# Patient Record
Sex: Male | Born: 1960 | Race: White | Hispanic: No | Marital: Married | Smoking: Never smoker
Health system: Southern US, Community
[De-identification: ages and names within clinical notes are randomized; demographics above are authoritative.]

## PROBLEM LIST (undated history)

## (undated) DIAGNOSIS — Z8719 Personal history of other diseases of the digestive system: Secondary | ICD-10-CM

## (undated) DIAGNOSIS — M1990 Unspecified osteoarthritis, unspecified site: Secondary | ICD-10-CM

## (undated) DIAGNOSIS — G4733 Obstructive sleep apnea (adult) (pediatric): Secondary | ICD-10-CM

## (undated) DIAGNOSIS — J45990 Exercise induced bronchospasm: Secondary | ICD-10-CM

## (undated) DIAGNOSIS — D759 Disease of blood and blood-forming organs, unspecified: Secondary | ICD-10-CM

## (undated) DIAGNOSIS — Z9109 Other allergy status, other than to drugs and biological substances: Secondary | ICD-10-CM

## (undated) DIAGNOSIS — J449 Chronic obstructive pulmonary disease, unspecified: Secondary | ICD-10-CM

## (undated) DIAGNOSIS — D509 Iron deficiency anemia, unspecified: Secondary | ICD-10-CM

## (undated) DIAGNOSIS — K219 Gastro-esophageal reflux disease without esophagitis: Secondary | ICD-10-CM

## (undated) HISTORY — PX: KNEE ARTHROSCOPY: SUR90

## (undated) HISTORY — PX: ESOPHAGOGASTRODUODENOSCOPY: SHX1529

## (undated) HISTORY — PX: COLONOSCOPY: SHX174

## (undated) SURGERY — IMAGING PROCEDURE, GI TRACT, INTRALUMINAL, VIA CAPSULE

---

## 2005-11-19 ENCOUNTER — Other Ambulatory Visit: Admission: RE | Admit: 2005-11-19 | Discharge: 2005-11-19 | Payer: Self-pay | Admitting: Urology

## 2011-04-29 ENCOUNTER — Emergency Department (HOSPITAL_COMMUNITY)
Admission: EM | Admit: 2011-04-29 | Discharge: 2011-04-29 | Disposition: A | Discharge status: Home / Self Care | Payer: Managed Care, Other (non HMO) | Attending: Emergency Medicine | Admitting: Emergency Medicine

## 2011-04-29 ENCOUNTER — Emergency Department (HOSPITAL_COMMUNITY): Payer: Managed Care, Other (non HMO)

## 2011-04-29 DIAGNOSIS — G510 Bell's palsy: Secondary | ICD-10-CM | POA: Insufficient documentation

## 2011-04-29 LAB — DIFFERENTIAL
Eosinophils Relative: 4 % (ref 0–5)
Lymphocytes Relative: 45 % (ref 12–46)
Lymphs Abs: 2.3 10*3/uL (ref 0.7–4.0)

## 2011-04-29 LAB — BASIC METABOLIC PANEL
BUN: 14 mg/dL (ref 6–23)
CO2: 23 mEq/L (ref 19–32)
Chloride: 104 mEq/L (ref 96–112)
Creatinine, Ser: 0.91 mg/dL (ref 0.50–1.35)
Glucose, Bld: 101 mg/dL — ABNORMAL HIGH (ref 70–99)

## 2011-04-29 LAB — CBC
HCT: 35.3 % — ABNORMAL LOW (ref 39.0–52.0)
MCV: 85.5 fL (ref 78.0–100.0)
RBC: 4.13 MIL/uL — ABNORMAL LOW (ref 4.22–5.81)
RDW: 12.6 % (ref 11.5–15.5)
WBC: 5 10*3/uL (ref 4.0–10.5)

## 2012-06-21 ENCOUNTER — Telehealth (INDEPENDENT_AMBULATORY_CARE_PROVIDER_SITE_OTHER): Payer: Self-pay | Admitting: *Deleted

## 2012-06-21 ENCOUNTER — Ambulatory Visit (INDEPENDENT_AMBULATORY_CARE_PROVIDER_SITE_OTHER): Payer: Managed Care, Other (non HMO) | Admitting: Internal Medicine

## 2012-06-21 ENCOUNTER — Encounter (INDEPENDENT_AMBULATORY_CARE_PROVIDER_SITE_OTHER): Payer: Self-pay | Admitting: Internal Medicine

## 2012-06-21 ENCOUNTER — Encounter (INDEPENDENT_AMBULATORY_CARE_PROVIDER_SITE_OTHER): Payer: Self-pay | Admitting: *Deleted

## 2012-06-21 ENCOUNTER — Other Ambulatory Visit (INDEPENDENT_AMBULATORY_CARE_PROVIDER_SITE_OTHER): Payer: Self-pay | Admitting: *Deleted

## 2012-06-21 VITALS — BP 102/80 | HR 72 | Temp 98.0°F | Ht 70.0 in | Wt 232.0 lb

## 2012-06-21 DIAGNOSIS — D509 Iron deficiency anemia, unspecified: Secondary | ICD-10-CM

## 2012-06-21 DIAGNOSIS — K625 Hemorrhage of anus and rectum: Secondary | ICD-10-CM

## 2012-06-21 DIAGNOSIS — Z1211 Encounter for screening for malignant neoplasm of colon: Secondary | ICD-10-CM

## 2012-06-21 DIAGNOSIS — R195 Other fecal abnormalities: Secondary | ICD-10-CM | POA: Insufficient documentation

## 2012-06-21 MED ORDER — PEG-KCL-NACL-NASULF-NA ASC-C 100 G PO SOLR: 1.0000 | Freq: Once | ORAL | Status: DC

## 2012-06-21 NOTE — Patient Instructions (Addendum)
Colonoscopy, if normal EGD.  The risks and benefits such as perforation, bleeding, and infection were reviewed with the patient and is agreeable.  

## 2012-06-21 NOTE — Telephone Encounter (Signed)
agree

## 2012-06-21 NOTE — Telephone Encounter (Signed)
Patient needs movi prep 

## 2012-06-21 NOTE — Progress Notes (Signed)
Subjective:     Patient ID: Jacob Hanna, male   DOB: 20-Jan-1961, 51 y.o.   MRN: [id number]  HPI Referred by Dr. Gerda Diss for anemia. H and H 12.3 and 35.7. MCV 81.85m platelet ct 284. He tells me he sees bright red blood per rectum after a hard BM.  Occuring for about a year. There has been no unintentional weight loss. Appetite is good. No acid reflux. No dysphagia. No abdominal pain. He usually has a BM about 1 a day or every 3-4 days. No change in stools. Normal caliber.  He has had a tired feeling for about a year, progressively worsened.  06/15/2012 Iron 41, UIBC 464, TIBC 505, % Saturation: 8,  Ferritin 2.  Vitamin B12 926.     Review of Systems see hpi Current Outpatient Prescriptions  Medication Sig Dispense Refill  . fish oil-omega-3 fatty acids 1000 MG capsule Take 2 g by mouth daily.      . Multiple Vitamin (MULTIVITAMIN) tablet Take 1 tablet by mouth daily.      . vitamin B-12 (CYANOCOBALAMIN) 250 MCG tablet Take 1,000 mcg by mouth daily.       History reviewed. No pertinent past medical history. History reviewed. No pertinent past surgical history. No Known Allergies History   Social History  . Marital Status: Married    Spouse Name: N/A    Number of Children: N/A  . Years of Education: N/A   Occupational History  . Not on file.   Social History Main Topics  . Smoking status: Never Smoker   . Smokeless tobacco: Not on file  . Alcohol Use: No  . Drug Use: No  . Sexually Active: Not on file   Other Topics Concern  . Not on file   Social History Narrative  . No narrative on file   Family Status  Relation Status Death Age  . Mother Alive     good health  . Father Deceased     CHF,  bleeding internally  . Sister Alive     heart murmur  . Brother Alive     good health       Objective:   Physical Exam Filed Vitals:   06/21/12 1021  Height: 5\' 10"  (1.778 m)  Weight: 232 lb (105.235 kg)   Alert and oriented. Skin warm and dry. Oral mucosa is moist.    . Sclera anicteric, conjunctivae is pink. Thyroid not enlarged. No cervical lymphadenopathy. Lungs clear. Heart regular rate and rhythm.  Abdomen is soft. Bowel sounds are positive. No hepatomegaly. No abdominal masses felt. No tenderness.  No edema to lower extremities.  Stool brown and grossly guaiac positive.    Assessment:   Iron deficiency anemia.  Colonic neoplasm needs to be ruled.  If colonoscopy normal, PUD needs to be ruled out.    Plan:    Colonoscopy if normal EGD. The risks and benefits such as perforation, bleeding, and infection were reviewed with the patient and is agreeable.     Copy to [employer].

## 2012-06-22 ENCOUNTER — Encounter (HOSPITAL_COMMUNITY): Payer: Self-pay | Admitting: Pharmacy Technician

## 2012-06-30 LAB — HM COLONOSCOPY

## 2012-06-30 MED ORDER — SODIUM CHLORIDE 0.45 % IV SOLN
INTRAVENOUS | Status: DC
  Administered 2012-07-01: 1000 mL via INTRAVENOUS

## 2012-07-01 ENCOUNTER — Ambulatory Visit (HOSPITAL_COMMUNITY)
Admission: RE | Admit: 2012-07-01 | Discharge: 2012-07-01 | Disposition: A | Discharge status: Home / Self Care | Payer: Managed Care, Other (non HMO) | Source: Ambulatory Visit | Attending: Internal Medicine | Admitting: Internal Medicine

## 2012-07-01 ENCOUNTER — Encounter (HOSPITAL_COMMUNITY): Payer: Self-pay | Admitting: *Deleted

## 2012-07-01 ENCOUNTER — Encounter (HOSPITAL_COMMUNITY)
Admission: RE | Disposition: A | Discharge status: Home / Self Care | Payer: Self-pay | Source: Ambulatory Visit | Attending: Internal Medicine

## 2012-07-01 DIAGNOSIS — K2100 Gastro-esophageal reflux disease with esophagitis, without bleeding: Secondary | ICD-10-CM | POA: Insufficient documentation

## 2012-07-01 DIAGNOSIS — K625 Hemorrhage of anus and rectum: Secondary | ICD-10-CM

## 2012-07-01 DIAGNOSIS — K449 Diaphragmatic hernia without obstruction or gangrene: Secondary | ICD-10-CM | POA: Insufficient documentation

## 2012-07-01 DIAGNOSIS — K644 Residual hemorrhoidal skin tags: Secondary | ICD-10-CM | POA: Insufficient documentation

## 2012-07-01 DIAGNOSIS — K921 Melena: Secondary | ICD-10-CM | POA: Insufficient documentation

## 2012-07-01 DIAGNOSIS — D509 Iron deficiency anemia, unspecified: Secondary | ICD-10-CM | POA: Insufficient documentation

## 2012-07-01 SURGERY — COLONOSCOPY WITH ESOPHAGOGASTRODUODENOSCOPY (EGD)
Anesthesia: Moderate Sedation

## 2012-07-01 MED ORDER — STERILE WATER FOR IRRIGATION IR SOLN
Status: DC | PRN
  Administered 2012-07-01: 14:00:00

## 2012-07-01 MED ORDER — MIDAZOLAM HCL 5 MG/5ML IJ SOLN
INTRAMUSCULAR | Status: DC | PRN
  Administered 2012-07-01: 2 mg via INTRAVENOUS
  Administered 2012-07-01: 3 mg via INTRAVENOUS
  Administered 2012-07-01 (×5): 2 mg via INTRAVENOUS

## 2012-07-01 MED ORDER — PANTOPRAZOLE SODIUM 40 MG PO TBEC: 40.0000 mg | DELAYED_RELEASE_TABLET | Freq: Every day | ORAL | Status: DC

## 2012-07-01 MED ORDER — BUTAMBEN-TETRACAINE-BENZOCAINE 2-2-14 % EX AERO
INHALATION_SPRAY | CUTANEOUS | Status: DC | PRN
  Administered 2012-07-01: 2 via TOPICAL

## 2012-07-01 MED ORDER — MEPERIDINE HCL 50 MG/ML IJ SOLN
INTRAMUSCULAR | Status: DC | PRN
  Administered 2012-07-01 (×2): 25 mg via INTRAVENOUS

## 2012-07-01 MED ORDER — MIDAZOLAM HCL 5 MG/5ML IJ SOLN
INTRAMUSCULAR | Status: AC
  Filled 2012-07-01: qty 15

## 2012-07-01 MED ORDER — FERROUS SULFATE 325 (65 FE) MG PO TABS: 325.0000 mg | ORAL_TABLET | Freq: Two times a day (BID) | ORAL | Status: DC

## 2012-07-01 MED ORDER — MEPERIDINE HCL 50 MG/ML IJ SOLN
INTRAMUSCULAR | Status: AC
  Filled 2012-07-01: qty 1

## 2012-07-01 NOTE — Op Note (Signed)
EGD PROCEDURE REPORT  PATIENT:  Jacob Hanna  MR#:  [id number] Birthdate:  [birth year], 51 y.o., male Endoscopist:  Dr. Malissa Hippo, MD Referred By:  Dr. Lilyan Punt, MD Procedure Date: 07/01/2012  Procedure: Colonoscopy followed by esophagogastroduodenoscopy.  Indications:  Patient is 51 year old Caucasian male who was recently found to have iron deficiency anemia. He does give history of intermittent constipation and hematochezia. When he was seen in the office recently as stool was noted to be heme positive. He does take Naprosyn and/or Advil 2-3 times a week. He denies frank rectal bleeding melena abdominal pain frequent heartburn nausea vomiting or dysphagia. Family history is negative for colorectal carcinoma. He will undergo colonoscopy to be followed by EGD if colonoscopy is negative.            Informed Consent:  The risks, benefits, alternatives & imponderables which include, but are not limited to, bleeding, infection, perforation, drug reaction and potential missed lesion have been reviewed.  The potential for biopsy, lesion removal, esophageal dilation, etc. have also been discussed.  Questions have been answered.  All parties agreeable.  Please see history & physical in medical record for more information.  Medications:  Demerol 50 mg IV Versed 15 mg IV Cetacaine spray topically for oropharyngeal anesthesia    COLONOSCOPY Description of procedure:  After a digital rectal exam was performed, that colonoscope was advanced from the anus through the rectum and colon to the area of the cecum, ileocecal valve and appendiceal orifice. The cecum was deeply intubated. These structures were well-seen and photographed for the record. From the level of the cecum and ileocecal valve, the scope was slowly and cautiously withdrawn. The mucosal surfaces were carefully surveyed utilizing scope tip to flexion to facilitate fold flattening as needed. The scope was pulled down into the rectum  where a thorough exam including retroflexion was performed.  Findings:   Prep satisfactory. Normal mucosa of the colon throughout. Normal rectal mucosa. Small hemorrhoids below the dentate line.  Therapeutic/Diagnostic Maneuvers Performed:  None  Complications:  None  Cecal Withdrawal Time:  9 minutes EGD  Description of procedure:  The endoscope was introduced through the mouth and advanced to the second portion of the duodenum without difficulty or limitations. The mucosal surfaces were surveyed very carefully during advancement of the scope and upon withdrawal.  Findings:  Esophagus:  Mucosa of the esophagus was normal. Focal erythema and edema noted at GE junction. Linear erosions noted at the level of hiatus as well as involving mucosa of the herniated part of the stomach. GEJ:  39 cm Hiatus:  44 cm Stomach:  Stomach was empty and distended very well with insufflation. Folds in the proximal stomach were normal. Examination mucosa at body was normal. There was 5 mm submucosal lesion at antrum possibly a lipoma; it was left alone. Angularis was unremarkable. Hernia was easily seen on retroflexed view. Duodenum:  Patchy bulbar erythema and edema but no erosions or ulcers noted.  Therapeutic/Diagnostic Maneuvers Performed:  None  Impression:  Normal colonoscopy except small external hemorrhoids. Mild changes of reflux esophagitis limited to GE junction. Moderate size hiatal hernia with erosions involving herniated part of the stomach and at the level of hiatus. Incidental finding of 5 mm submucosal nodule at antrum possibly a small lipoma. Bulbar duodenitis. It is possible that he has been losing blood from his small bowel secondary to NSAID injury.   Recommendations:  Patient advised to discontinue Naprosyn and use low-dose Advil when absolutely necessary on  as-needed basis. Anti-reflux measures. Pantoprazole 40 mg by mouth every morning. Ferrous Sulfate 325 mg by mouth twice  a day. Office visit in 8 weeks. Will determine at the time of his next office visit whether or not he will need  small bowel given capsule study  REHMAN,NAJEEB U  07/01/2012 2:14 PM  CC: Dr. Lilyan Punt, MD & Dr. Bonnetta Barry ref. provider found

## 2012-07-01 NOTE — H&P (Signed)
Jacob Hanna is an 51 y.o. male.   Chief Complaint: Patient is here for colonoscopy followed by esophagogastroduodenoscopy if needed. HPI: Patient is 50 year old Caucasian male who has iron deficiency anemia. He does give history of intermittent constipation and hematochezia. He has good appetite and denies abdominal pain frequent heartburn nausea or vomiting. He has been using Naprosyn or ibuprofen no more than twice a week but not every week.  History reviewed. No pertinent past medical history.  History reviewed. No pertinent past surgical history.  History reviewed. No pertinent family history. Social History:  reports that he has never smoked. He does not have any smokeless tobacco history on file. He reports that he does not drink alcohol or use illicit drugs.  Allergies: No Known Allergies  Medications Prior to Admission  Medication Sig Dispense Refill  . fish oil-omega-3 fatty acids 1000 MG capsule Take 2 g by mouth daily.      . mometasone (ELOCON) 0.1 % cream Apply 1 application topically Once daily as needed. Rash      . Multiple Vitamin (MULTIVITAMIN) tablet Take 1 tablet by mouth daily.      . naproxen (NAPROSYN) 500 MG tablet Take 500 mg by mouth 2 (two) times daily as needed. Pain      . peg 3350 powder (MOVIPREP) 100 G SOLR Take 1 kit (100 g total) by mouth once.  1 kit  0  . vitamin B-12 (CYANOCOBALAMIN) 1000 MCG tablet Take 1,000 mcg by mouth daily.        No results found for this or any previous visit (from the past 48 hour(s)). No results found.  ROS  Blood pressure 131/87, pulse 65, temperature 97.6 F (36.4 C), temperature source Oral, resp. rate 16, SpO2 97.00%. Physical Exam  Constitutional: He appears well-developed and well-nourished.  HENT:  Mouth/Throat: Oropharynx is clear and moist. No oropharyngeal exudate.  Eyes: Conjunctivae are normal. No scleral icterus.  Neck: No thyromegaly present.  Cardiovascular: Normal rate, regular rhythm and normal  heart sounds.   No murmur heard. Respiratory: Effort normal.  GI: Soft. He exhibits no distension and no mass. There is no tenderness.  Musculoskeletal: He exhibits no edema.  Lymphadenopathy:    He has no cervical adenopathy.  Neurological: He is alert.  Skin: Skin is warm and dry.     Assessment/Plan Hematochezia. Intermittent NSAID use Iron deficiency anemia. Diagnostic colonoscopy. EGD if colonoscopy is negative.  REHMAN,NAJEEB U 07/01/2012, 1:26 PM

## 2012-07-15 ENCOUNTER — Encounter (INDEPENDENT_AMBULATORY_CARE_PROVIDER_SITE_OTHER): Payer: Self-pay

## 2012-08-29 ENCOUNTER — Encounter (INDEPENDENT_AMBULATORY_CARE_PROVIDER_SITE_OTHER): Payer: Self-pay | Admitting: Internal Medicine

## 2012-08-29 ENCOUNTER — Ambulatory Visit (INDEPENDENT_AMBULATORY_CARE_PROVIDER_SITE_OTHER): Payer: Managed Care, Other (non HMO) | Admitting: Internal Medicine

## 2012-08-29 ENCOUNTER — Encounter (INDEPENDENT_AMBULATORY_CARE_PROVIDER_SITE_OTHER): Payer: Self-pay | Admitting: *Deleted

## 2012-08-29 ENCOUNTER — Other Ambulatory Visit (INDEPENDENT_AMBULATORY_CARE_PROVIDER_SITE_OTHER): Payer: Self-pay | Admitting: *Deleted

## 2012-08-29 VITALS — BP 120/72 | HR 72 | Temp 98.2°F | Ht 71.0 in | Wt 232.0 lb

## 2012-08-29 DIAGNOSIS — D649 Anemia, unspecified: Secondary | ICD-10-CM

## 2012-08-29 DIAGNOSIS — K922 Gastrointestinal hemorrhage, unspecified: Secondary | ICD-10-CM

## 2012-08-29 DIAGNOSIS — D509 Iron deficiency anemia, unspecified: Secondary | ICD-10-CM

## 2012-08-29 LAB — IRON AND TIBC
Iron: 192 ug/dL — ABNORMAL HIGH (ref 42–165)
TIBC: 494 ug/dL — ABNORMAL HIGH (ref 215–435)
UIBC: 302 ug/dL (ref 125–400)

## 2012-08-29 LAB — CBC
Hemoglobin: 14.3 g/dL (ref 13.0–17.0)
MCH: 29.6 pg (ref 26.0–34.0)
Platelets: 287 10*3/uL (ref 150–400)
RBC: 4.83 MIL/uL (ref 4.22–5.81)
WBC: 5.1 10*3/uL (ref 4.0–10.5)

## 2012-08-29 LAB — FERRITIN: Ferritin: 16 ng/mL — ABNORMAL LOW (ref 22–322)

## 2012-08-29 NOTE — Progress Notes (Addendum)
Subjective:     Patient ID: Jacob Hanna, male   DOB: [birth year], 51 y.o.   MRN: [id number]  HPIHere today for follow. He says his energy level is better. Underwent a colonoscopy and EGD in August. Usually has a BM about every 2 days. Stools are soft.  He is presently taking iron. He has not seen any blood.  Stools are normal caliber. No weight loss. Appetite is good. No weight loss.  No dysphagia.  No NSAIDs per patient.   Seen in August for anemia. H and H 12.3 and 35.7, MCV 81.  He had been passing bright red blood from his rectum after a hard stool.  Symptoms of being tired. 8/72013 Iron 41, UIBC 464, TIBC 505, Ferritin 2, Saturation 8, Vitamin B12 926.      EGD: 07/01/2012 Impression:  Normal colonoscopy except small external hemorrhoids.  Mild changes of reflux esophagitis limited to GE junction.  Moderate size hiatal hernia with erosions involving herniated part of the stomach and at the level of hiatus.  Incidental finding of 5 mm submucosal nodule at antrum possibly a small lipoma.  Bulbar duodenitis.  It is possible that he has been losing blood from his small bowel secondary to NSAID injury.      Review of Systems Current Outpatient Prescriptions  Medication Sig Dispense Refill  . ferrous sulfate 325 (65 FE) MG tablet Take 1 tablet (325 mg total) by mouth 2 (two) times daily after a meal.  30 tablet  11  . fish oil-omega-3 fatty acids 1000 MG capsule Take 2 g by mouth daily.      . mometasone (ELOCON) 0.1 % cream Apply 1 application topically Once daily as needed. Rash      . Multiple Vitamin (MULTIVITAMIN) tablet Take 1 tablet by mouth daily.      . pantoprazole (PROTONIX) 40 MG tablet Take 40 mg by mouth as needed.      . vitamin B-12 (CYANOCOBALAMIN) 1000 MCG tablet Take 1,000 mcg by mouth daily.      Marland Kitchen DISCONTD: pantoprazole (PROTONIX) 40 MG tablet Take 1 tablet (40 mg total) by mouth daily.  30 tablet  5   History reviewed. No pertinent past medical  history. History reviewed. No pertinent past surgical history. History   Social History  . Marital Status: Married    Spouse Name: N/A    Number of Children: N/A  . Years of Education: N/A   Occupational History  . Not on file.   Social History Main Topics  . Smoking status: Never Smoker   . Smokeless tobacco: Not on file  . Alcohol Use: No  . Drug Use: No  . Sexually Active: Not on file   Other Topics Concern  . Not on file   Social History Narrative  . No narrative on file   Family Status  Relation Status Death Age  . Mother Alive     good health  . Father Deceased     CHF,  bleeding internally  . Sister Alive     heart murmur  . Brother Alive     good health   No Known Allergies     Objective:   Physical Exam Filed Vitals:   08/29/12 0939  BP: 120/72  Pulse: 72  Temp: 98.2 F (36.8 C)  Height: 5\' 11"  (1.803 m)  Weight: 232 lb (105.235 kg)    Alert and oriented. Skin warm and dry. Oral mucosa is moist.   . Sclera anicteric, conjunctivae is  pink. Thyroid not enlarged. No cervical lymphadenopathy. Lungs clear. Heart regular rate and rhythm.  Abdomen is soft. Bowel sounds are positive. No hepatomegaly. No abdominal masses felt. No tenderness.  No edema to lower extremities. Stool guaiac positive     Assessment:    GI bleed with normal Colonoscopy. Bulbar duodenitis.   I discussed this case with Dr. Karilyn Cota. Plan:    H and H, Iron studies. Givens capsule study.

## 2012-08-29 NOTE — Patient Instructions (Addendum)
H and H, iron studies. Further recommendations to follow

## 2012-09-01 ENCOUNTER — Encounter (HOSPITAL_COMMUNITY): Payer: Self-pay | Admitting: Pharmacy Technician

## 2012-09-07 ENCOUNTER — Ambulatory Visit (HOSPITAL_COMMUNITY)
Admission: RE | Admit: 2012-09-07 | Discharge: 2012-09-07 | Disposition: A | Discharge status: Home / Self Care | Payer: Managed Care, Other (non HMO) | Source: Ambulatory Visit | Attending: Internal Medicine | Admitting: Internal Medicine

## 2012-09-07 ENCOUNTER — Encounter (HOSPITAL_COMMUNITY)
Admission: RE | Disposition: A | Discharge status: Home / Self Care | Payer: Self-pay | Source: Ambulatory Visit | Attending: Internal Medicine

## 2012-09-07 DIAGNOSIS — D509 Iron deficiency anemia, unspecified: Secondary | ICD-10-CM | POA: Insufficient documentation

## 2012-09-07 HISTORY — PX: GIVENS CAPSULE STUDY: SHX5432

## 2012-09-07 SURGERY — IMAGING PROCEDURE, GI TRACT, INTRALUMINAL, VIA CAPSULE

## 2012-09-07 NOTE — H&P (Signed)
  Patient is 51 year old male who was seen over 2 months ago with iron deficiency anemia and heme-positive stools. He underwent colonoscopy followed by esophagogastroduodenoscopy. He has been on NSAIDs and he was advised to either stop using NSAIDs or use them sparingly. He was seen in the office last week and noted to have heme-positive stool. Had his H&H has corrected his serum ferritin was still quite low. He is therefore undergoing small bowel given capsule study to complete workup of his iron deficiency anemia and heme-positive stools. Please refer to office note by Ms. Dorene Ar NP from 08/29/2012 for more details.

## 2012-09-08 ENCOUNTER — Encounter (HOSPITAL_COMMUNITY): Payer: Self-pay | Admitting: Internal Medicine

## 2012-09-11 ENCOUNTER — Telehealth (INDEPENDENT_AMBULATORY_CARE_PROVIDER_SITE_OTHER): Payer: Self-pay | Admitting: Internal Medicine

## 2012-09-11 DIAGNOSIS — K6389 Other specified diseases of intestine: Secondary | ICD-10-CM

## 2012-09-11 DIAGNOSIS — Z9889 Other specified postprocedural states: Secondary | ICD-10-CM

## 2012-09-11 DIAGNOSIS — D509 Iron deficiency anemia, unspecified: Secondary | ICD-10-CM

## 2012-09-11 DIAGNOSIS — K921 Melena: Secondary | ICD-10-CM

## 2012-09-11 NOTE — Op Note (Signed)
Small Bowel Givens Capsule Study Procedure date:  09/07/2012.  PCP:  Dr. Lilyan Punt, MD  Indication for procedure:  Patient is 51 year old Caucasian male who was evaluated in August 2013 for iron deficiency anemia and heme-positive stool. He underwent EGD and colonoscopy. He had moderate size heart hernia with erosions and duodenitis and colonoscopy was unremarkable. He was seen in the office recently and noted to have heme positive stool and his serum ferritin was still low at 16 but his hemoglobin  has normalized to 14.3. Since he is still losing blood from his GI tract he is undergoing small bowel study to complete his workup. Patient states he  takes OTC NSAIDs sporadically    Findings:   Patient was able to swallow given capsule without any difficulty. Two small punctate telangiectasia noted. Focal area with erythema and depressed mucosa seen at 4 hours 13 minutes and 39 seconds.  First Gastric image:  11 sec. First Duodenal image: 53 min, 35 sec. First Ileo-Cecal Valve image: 4 h,14 min,56 sec. First Cecal image: 4 h,15 min. Gastric Passage time: 53 min and, 24 sec. Small Bowel Passage time:  3 h 22 min and 36 sec.  Summary & Recommendations: Two small punctate telangiectasia noted at jejunum without stigmata of bleed. Focal mucosal abnormality at distal ileum possibly healing ulcer related to NSAID use. Findings are reviewed with patient over the phone. He was advised to keep OTC NSAIDs used to minimum. He'll have H&H and serum ferritin in 8 weeks.

## 2012-09-11 NOTE — Telephone Encounter (Signed)
Results of small bowel given study reviewed with patient. Few tiny punctate telangiectasia without bleeding. Abnormal area in distal small bowel or ileum suspicious for ileitis or healing ulcer. Patient will continue ferrous sulfate and half H&H and serum ferritin in last week of December 2013.

## 2012-09-16 ENCOUNTER — Telehealth (INDEPENDENT_AMBULATORY_CARE_PROVIDER_SITE_OTHER): Payer: Self-pay | Admitting: *Deleted

## 2012-09-16 DIAGNOSIS — D509 Iron deficiency anemia, unspecified: Secondary | ICD-10-CM

## 2012-09-16 NOTE — Telephone Encounter (Signed)
Labs are noted for the last week in December 2013

## 2012-09-16 NOTE — Telephone Encounter (Signed)
Per Dr.rehman the patient will need these labs the last week in December 2013

## 2012-10-04 ENCOUNTER — Telehealth (INDEPENDENT_AMBULATORY_CARE_PROVIDER_SITE_OTHER): Payer: Self-pay | Admitting: *Deleted

## 2012-10-04 ENCOUNTER — Encounter (INDEPENDENT_AMBULATORY_CARE_PROVIDER_SITE_OTHER): Payer: Self-pay | Admitting: *Deleted

## 2012-10-04 DIAGNOSIS — D509 Iron deficiency anemia, unspecified: Secondary | ICD-10-CM

## 2012-10-04 NOTE — Telephone Encounter (Signed)
Lab order printed

## 2012-10-26 LAB — HEMOGLOBIN AND HEMATOCRIT, BLOOD
HCT: 39.6 % (ref 39.0–52.0)
Hemoglobin: 14 g/dL (ref 13.0–17.0)

## 2012-10-27 LAB — FERRITIN: Ferritin: 23 ng/mL (ref 22–322)

## 2012-10-31 ENCOUNTER — Telehealth (INDEPENDENT_AMBULATORY_CARE_PROVIDER_SITE_OTHER): Payer: Self-pay | Admitting: *Deleted

## 2012-10-31 DIAGNOSIS — D509 Iron deficiency anemia, unspecified: Secondary | ICD-10-CM

## 2012-10-31 NOTE — Telephone Encounter (Signed)
Per Dr.Rehman the patient will need to have H/H, Ferritin in 3 months

## 2012-12-19 ENCOUNTER — Other Ambulatory Visit (HOSPITAL_COMMUNITY): Payer: Self-pay | Admitting: Family Medicine

## 2012-12-19 ENCOUNTER — Ambulatory Visit (HOSPITAL_COMMUNITY)
Admission: RE | Admit: 2012-12-19 | Discharge: 2012-12-19 | Disposition: A | Discharge status: Home / Self Care | Payer: Managed Care, Other (non HMO) | Source: Ambulatory Visit | Attending: Family Medicine | Admitting: Family Medicine

## 2012-12-19 DIAGNOSIS — M25569 Pain in unspecified knee: Secondary | ICD-10-CM

## 2013-01-05 ENCOUNTER — Other Ambulatory Visit (INDEPENDENT_AMBULATORY_CARE_PROVIDER_SITE_OTHER): Payer: Self-pay | Admitting: *Deleted

## 2013-01-05 ENCOUNTER — Encounter (INDEPENDENT_AMBULATORY_CARE_PROVIDER_SITE_OTHER): Payer: Self-pay | Admitting: *Deleted

## 2013-01-05 DIAGNOSIS — D509 Iron deficiency anemia, unspecified: Secondary | ICD-10-CM

## 2013-01-18 ENCOUNTER — Encounter: Payer: Self-pay | Admitting: Orthopedic Surgery

## 2013-01-18 ENCOUNTER — Ambulatory Visit (INDEPENDENT_AMBULATORY_CARE_PROVIDER_SITE_OTHER): Payer: Managed Care, Other (non HMO) | Admitting: Orthopedic Surgery

## 2013-01-18 VITALS — BP 118/76 | Ht 71.0 in | Wt 234.0 lb

## 2013-01-18 DIAGNOSIS — S83242A Other tear of medial meniscus, current injury, left knee, initial encounter: Secondary | ICD-10-CM

## 2013-01-18 DIAGNOSIS — IMO0002 Reserved for concepts with insufficient information to code with codable children: Secondary | ICD-10-CM

## 2013-01-18 MED ORDER — TRAMADOL-ACETAMINOPHEN 37.5-325 MG PO TABS: 1.0000 | ORAL_TABLET | ORAL | Status: DC | PRN

## 2013-01-18 NOTE — Patient Instructions (Addendum)
Apply ice as needed   Take medication as needed   You have been scheduled for an MRI scan.  Your insurance company requires a precertification prior to scheduling the MRI.  If the MRI scan is not approved we will let you know and make further treatment recommendations according to your insurance's guidelines.   We will schedule you for another  appointment to review the results and make further treatment recommendations   Arthroscopic Procedure, Knee An arthroscopic procedure can find what is wrong with your knee. PROCEDURE Arthroscopy is a surgical technique that allows your orthopedic surgeon to diagnose and treat your knee injury with accuracy. They will look into your knee through a small instrument. This is almost like a small (pencil sized) telescope. Because arthroscopy affects your knee less than open knee surgery, you can anticipate a more rapid recovery. Taking an active role by following your caregiver's instructions will help with rapid and complete recovery. Use crutches, rest, elevation, ice, and knee exercises as instructed. The length of recovery depends on various factors including type of injury, age, physical condition, medical conditions, and your rehabilitation. Your knee is the joint between the large bones (femur and tibia) in your leg. Cartilage covers these bone ends which are smooth and slippery and allow your knee to bend and move smoothly. Two menisci, thick, semi-lunar shaped pads of cartilage which form a rim inside the joint, help absorb shock and stabilize your knee. Ligaments bind the bones together and support your knee joint. Muscles move the joint, help support your knee, and take stress off the joint itself. Because of this all programs and physical therapy to rehabilitate an injured or repaired knee require rebuilding and strengthening your muscles. AFTER THE PROCEDURE  After the procedure, you will be moved to a recovery area until most of the effects of the  medication have worn off. Your caregiver will discuss the test results with you.  Only take over-the-counter or prescription medicines for pain, discomfort, or fever as directed by your caregiver. SEEK MEDICAL CARE IF:   You have increased bleeding from your wounds.  You see redness, swelling, or have increasing pain in your wounds.  You have pus coming from your wound.  You have an oral temperature above 102 F (38.9 C).  You notice a bad smell coming from the wound or dressing.  You have severe pain with any motion of your knee. SEEK IMMEDIATE MEDICAL CARE IF:   You develop a rash.  You have difficulty breathing.  You have any allergic problems. Document Released: 10/23/2000 Document Revised: 01/18/2012 Document Reviewed: 05/16/2008 [facility] Patient Information 2013 [place], [place].

## 2013-01-18 NOTE — Progress Notes (Signed)
Chief Complaint  Patient presents with  . Knee Pain    Left knee pain, fell bowling 6 weeks ago Referred by Dr. Lilyan Punt    History:   52 year old male who is a avid bowler was bowling and [name] and slipped in his knee twisted into a funny position and about 3 days later he started having acute sharp severe 8/10 medial knee pain has had it for 6 weeks despite anti-inflammatories and rest. The pain is sharp throbbing stabbing intermittent seems to be worse with bending his knee and walking. Hips are for him to support his weight. He has swelling of the joint.  He has a history of anemia secondary to blood in the stool he had a capsule study which showed no bleeding is on iron all other systems were reviewed and were normal  History as recorded  BP 118/76  Ht 5\' 11"  (1.803 m)  Wt 234 lb (106.142 kg)  BMI 32.65 kg/m2 General appearance is normal, the patient is alert and oriented x3 with normal mood and affect. In place with significant limp he cannot bear weight  Has a moderate joint effusion he holds his knee extended actually has painful flexion after 80 of flexion ligaments appear stable motor exams intact skin is normal  Tenderness over the medial joint line severe it was difficult to get a handle on his medial collateral ligament he did have a positive McMurray's  His opposite knee was normal, no tenderness or swelling full range of motion all ligaments are stable muscle tone is normal skin is intact  Upper extremity exam  The right and left upper extremity:   Inspection and palpation revealed no abnormalities in the upper extremities.   Range of motion is full without contracture.  Motor exam is normal with grade 5 strength.  The joints are fully reduced without subluxation.  There is no atrophy or tremor and muscle tone is normal.  All joints are stable.   Cardiovascular pulses are normal sensation is intact no lymphadenopathy normal reflexes  and normal coordination  X-ray shows mild degenerative change medially with effusion  Impression Acute medial meniscus tear of left knee, initial encounter - Plan: MR Knee Left  Wo Contrast, traMADol-acetaminophen (ULTRACET) 37.5-325 MG per tablet  Plan is to get an MRI and then proceed with arthroscopic surgery for medial meniscal tear is confirmed by MRI

## 2013-01-25 ENCOUNTER — Ambulatory Visit
Admission: RE | Admit: 2013-01-25 | Discharge: 2013-01-25 | Disposition: A | Discharge status: Home / Self Care | Payer: Managed Care, Other (non HMO) | Source: Ambulatory Visit | Attending: Orthopedic Surgery | Admitting: Orthopedic Surgery

## 2013-01-25 DIAGNOSIS — S83242A Other tear of medial meniscus, current injury, left knee, initial encounter: Secondary | ICD-10-CM

## 2013-01-26 ENCOUNTER — Ambulatory Visit (INDEPENDENT_AMBULATORY_CARE_PROVIDER_SITE_OTHER): Payer: Managed Care, Other (non HMO) | Admitting: Orthopedic Surgery

## 2013-01-26 ENCOUNTER — Encounter: Payer: Self-pay | Admitting: Orthopedic Surgery

## 2013-01-26 ENCOUNTER — Telehealth: Payer: Self-pay | Admitting: Orthopedic Surgery

## 2013-01-26 VITALS — BP 104/80 | Ht 71.0 in | Wt 234.0 lb

## 2013-01-26 DIAGNOSIS — S83242A Other tear of medial meniscus, current injury, left knee, initial encounter: Secondary | ICD-10-CM | POA: Insufficient documentation

## 2013-01-26 DIAGNOSIS — Z5189 Encounter for other specified aftercare: Secondary | ICD-10-CM

## 2013-01-26 DIAGNOSIS — S83242D Other tear of medial meniscus, current injury, left knee, subsequent encounter: Secondary | ICD-10-CM

## 2013-01-26 NOTE — Patient Instructions (Addendum)
SURGERY 29881 ON 3/25, OOW X 2 WEEKS AND LIGHT DUTY X 1 WEEK   Arthroscopic Procedure, Knee An arthroscopic procedure can find what is wrong with your knee. PROCEDURE Arthroscopy is a surgical technique that allows your orthopedic surgeon to diagnose and treat your knee injury with accuracy. They will look into your knee through a small instrument. This is almost like a small (pencil sized) telescope. Because arthroscopy affects your knee less than open knee surgery, you can anticipate a more rapid recovery. Taking an active role by following your caregiver's instructions will help with rapid and complete recovery. Use crutches, rest, elevation, ice, and knee exercises as instructed. The length of recovery depends on various factors including type of injury, age, physical condition, medical conditions, and your rehabilitation. Your knee is the joint between the large bones (femur and tibia) in your leg. Cartilage covers these bone ends which are smooth and slippery and allow your knee to bend and move smoothly. Two menisci, thick, semi-lunar shaped pads of cartilage which form a rim inside the joint, help absorb shock and stabilize your knee. Ligaments bind the bones together and support your knee joint. Muscles move the joint, help support your knee, and take stress off the joint itself. Because of this all programs and physical therapy to rehabilitate an injured or repaired knee require rebuilding and strengthening your muscles. AFTER THE PROCEDURE  After the procedure, you will be moved to a recovery area until most of the effects of the medication have worn off. Your caregiver will discuss the test results with you.   Only take over-the-counter or prescription medicines for pain, discomfort, or fever as directed by your caregiver.    You have been scheduled for arthroscocpic knee surgery.  All surgeries carry some risk.  Remember you always have the option of continued nonsurgical treatment.  However in this situation the risks vs. the benefits favor surgery as the best treatment option. The risks of the surgery includes the following but is not limited to bleeding, infection, pulmonary embolus, death from anesthesia, nerve injury vascular injury or need for further surgery, continued pain.  Specific to this procedure the following risks and complications are rare but possible Stiffness, pain, weakness, giving out  I expect  recovery will be in 3-4 weeks some patients take 6 weeks.  You  will need physical therapy after the procedure  Stop any blood thinning medication: such as warfarin, coumadin, naprosyn, ibuprofen, advil, diclofenac, aspirin

## 2013-01-26 NOTE — Telephone Encounter (Signed)
Contacted insurer, [employer], ph# [phone] regarding out-patient surgery scheduled 01/31/13 at [facility], CPT [phone], ICD-9 836.0  -Per customer representative, Maryjean Morn, no pre-authorization is required; states to use his name and today's date, 11:04a.m, for reference.

## 2013-01-26 NOTE — Progress Notes (Signed)
Patient ID: Jacob Hanna, male   DOB: Apr 24, 1961, 52 y.o.   MRN: [id number] Chief Complaint  Patient presents with  . Follow-up    MRI results   HISTORY: 52 year-old male with acute injury to his left knee complained of severe pain presents back with MRI findings showing meniscal tear. His pain has not improved his motion is worsening. We have counseled him on the pluses and minuses of surgical intervention. I do not think he will get better without surgery  Review of systems no change from his initial office visit March 1  Examination unchanged from his previous office visit March 1  MRI is reviewed the MRI my interpretation is that he has arthritis in his main is a large medial meniscal tear  The patient has been prepped for surgery  [id number]  Arthroscopy left knee partial medial meniscectomy

## 2013-01-26 NOTE — Patient Instructions (Addendum)
HAI GRABE  01/26/2013   Your procedure is scheduled on:   01/31/2013  Report to [facility] at  11:30 AM.  Call this number if you have problems the morning of surgery: [phone]   Remember:   Do not eat food or drink liquids after midnight.   Take these medicines the morning of surgery with A SIP OF WATER: ultracet if needed   Do not wear jewelry, make-up or nail polish.  Do not wear lotions, powders, or perfumes.   Do not shave 48 hours prior to surgery. Men may shave face and neck.  Do not bring valuables to the hospital.  Contacts, dentures or bridgework may not be worn into surgery.  Leave suitcase in the car. After surgery it may be brought to your room.  For patients admitted to the hospital, checkout time is 11:00 AM the day of discharge.   Patients discharged the day of surgery will not be allowed to drive  home.   Special Instructions: Shower using CHG 2 nights before surgery and the night before surgery.  If you shower the day of surgery use CHG.  Use special wash - you have one bottle of CHG for all showers.  You should use approximately 1/3 of the bottle for each shower.   Please read over the following fact sheets that you were given: Pain Booklet, Coughing and Deep Breathing, MRSA Information, Surgical Site Infection Prevention, Anesthesia Post-op Instructions and Care and Recovery After Surgery   Arthroscopic Procedure, Knee An arthroscopic procedure can find what is wrong with your knee. PROCEDURE Arthroscopy is a surgical technique that allows your orthopedic surgeon to diagnose and treat your knee injury with accuracy. They will look into your knee through a small instrument. This is almost like a small (pencil sized) telescope. Because arthroscopy affects your knee less than open knee surgery, you can anticipate a more rapid recovery. Taking an active role by following your caregiver's instructions will help with rapid and complete recovery. Use crutches,  rest, elevation, ice, and knee exercises as instructed. The length of recovery depends on various factors including type of injury, age, physical condition, medical conditions, and your rehabilitation. Your knee is the joint between the large bones (femur and tibia) in your leg. Cartilage covers these bone ends which are smooth and slippery and allow your knee to bend and move smoothly. Two menisci, thick, semi-lunar shaped pads of cartilage which form a rim inside the joint, help absorb shock and stabilize your knee. Ligaments bind the bones together and support your knee joint. Muscles move the joint, help support your knee, and take stress off the joint itself. Because of this all programs and physical therapy to rehabilitate an injured or repaired knee require rebuilding and strengthening your muscles. AFTER THE PROCEDURE  After the procedure, you will be moved to a recovery area until most of the effects of the medication have worn off. Your caregiver will discuss the test results with you.  Only take over-the-counter or prescription medicines for pain, discomfort, or fever as directed by your caregiver. SEEK MEDICAL CARE IF:   You have increased bleeding from your wounds.  You see redness, swelling, or have increasing pain in your wounds.  You have pus coming from your wound.  You have an oral temperature above 102 F (38.9 C).  You notice a bad smell coming from the wound or dressing.  You have severe pain with any motion of your knee. SEEK IMMEDIATE  MEDICAL CARE IF:   You develop a rash.  You have difficulty breathing.  You have any allergic problems. Document Released: 10/23/2000 Document Revised: 01/18/2012 Document Reviewed: 05/16/2008 [facility] Patient Information 2013 [place], [place]. PATIENT INSTRUCTIONS POST-ANESTHESIA  IMMEDIATELY FOLLOWING SURGERY:  Do not drive or operate machinery for the first twenty four hours after surgery.  Do not make any important decisions  for twenty four hours after surgery or while taking narcotic pain medications or sedatives.  If you develop intractable nausea and vomiting or a severe headache please notify your doctor immediately.  FOLLOW-UP:  Please make an appointment with your surgeon as instructed. You do not need to follow up with anesthesia unless specifically instructed to do so.  WOUND CARE INSTRUCTIONS (if applicable):  Keep a dry clean dressing on the anesthesia/puncture wound site if there is drainage.  Once the wound has quit draining you may leave it open to air.  Generally you should leave the bandage intact for twenty four hours unless there is drainage.  If the epidural site drains for more than 36-48 hours please call the anesthesia department.  QUESTIONS?:  Please feel free to call your physician or the hospital operator if you have any questions, and they will be happy to assist you.

## 2013-01-27 ENCOUNTER — Encounter (HOSPITAL_COMMUNITY): Payer: Self-pay

## 2013-01-27 ENCOUNTER — Other Ambulatory Visit: Payer: Self-pay

## 2013-01-27 ENCOUNTER — Encounter (HOSPITAL_COMMUNITY)
Admission: RE | Admit: 2013-01-27 | Discharge: 2013-01-27 | Disposition: A | Discharge status: Home / Self Care | Payer: Managed Care, Other (non HMO) | Source: Ambulatory Visit | Attending: Orthopedic Surgery | Admitting: Orthopedic Surgery

## 2013-01-27 LAB — HEMOGLOBIN AND HEMATOCRIT, BLOOD: Hemoglobin: 13.1 g/dL (ref 13.0–17.0)

## 2013-01-27 LAB — SURGICAL PCR SCREEN: Staphylococcus aureus: NEGATIVE

## 2013-01-27 MED ORDER — CHLORHEXIDINE GLUCONATE 4 % EX LIQD: 60.0000 mL | Freq: Once | CUTANEOUS | Status: DC

## 2013-01-31 ENCOUNTER — Encounter (HOSPITAL_COMMUNITY): Payer: Self-pay | Admitting: *Deleted

## 2013-01-31 ENCOUNTER — Encounter (HOSPITAL_COMMUNITY): Payer: Self-pay | Admitting: Anesthesiology

## 2013-01-31 ENCOUNTER — Ambulatory Visit (HOSPITAL_COMMUNITY): Payer: Managed Care, Other (non HMO) | Admitting: Anesthesiology

## 2013-01-31 ENCOUNTER — Encounter (HOSPITAL_COMMUNITY)
Admission: RE | Disposition: A | Discharge status: Home / Self Care | Payer: Self-pay | Source: Ambulatory Visit | Attending: Orthopedic Surgery

## 2013-01-31 ENCOUNTER — Ambulatory Visit (HOSPITAL_COMMUNITY)
Admission: RE | Admit: 2013-01-31 | Discharge: 2013-01-31 | Disposition: A | Discharge status: Home / Self Care | Payer: Managed Care, Other (non HMO) | Source: Ambulatory Visit | Attending: Orthopedic Surgery | Admitting: Orthopedic Surgery

## 2013-01-31 DIAGNOSIS — M23322 Other meniscus derangements, posterior horn of medial meniscus, left knee: Secondary | ICD-10-CM

## 2013-01-31 DIAGNOSIS — Z01810 Encounter for preprocedural cardiovascular examination: Secondary | ICD-10-CM | POA: Insufficient documentation

## 2013-01-31 DIAGNOSIS — Z01812 Encounter for preprocedural laboratory examination: Secondary | ICD-10-CM | POA: Insufficient documentation

## 2013-01-31 DIAGNOSIS — M23329 Other meniscus derangements, posterior horn of medial meniscus, unspecified knee: Secondary | ICD-10-CM | POA: Insufficient documentation

## 2013-01-31 DIAGNOSIS — M6750 Plica syndrome, unspecified knee: Secondary | ICD-10-CM

## 2013-01-31 DIAGNOSIS — M6752 Plica syndrome, left knee: Secondary | ICD-10-CM | POA: Diagnosis present

## 2013-01-31 HISTORY — PX: KNEE ARTHROSCOPY WITH EXCISION PLICA: SHX5647

## 2013-01-31 HISTORY — PX: KNEE ARTHROSCOPY WITH MEDIAL MENISECTOMY: SHX5651

## 2013-01-31 SURGERY — ARTHROSCOPY, KNEE, WITH MEDIAL MENISCECTOMY
Anesthesia: General | Laterality: Left | Wound class: Clean

## 2013-01-31 MED ORDER — 0.9 % SODIUM CHLORIDE (POUR BTL) OPTIME
TOPICAL | Status: DC | PRN
  Administered 2013-01-31: 1000 mL

## 2013-01-31 MED ORDER — CEFAZOLIN SODIUM 1-5 GM-% IV SOLN
INTRAVENOUS | Status: AC
  Filled 2013-01-31: qty 50

## 2013-01-31 MED ORDER — CEFAZOLIN SODIUM-DEXTROSE 2-3 GM-% IV SOLR
INTRAVENOUS | Status: AC
  Filled 2013-01-31: qty 50

## 2013-01-31 MED ORDER — EPINEPHRINE HCL 1 MG/ML IJ SOLN
INTRAMUSCULAR | Status: AC
  Filled 2013-01-31: qty 5

## 2013-01-31 MED ORDER — ONDANSETRON HCL 4 MG/2ML IJ SOLN
INTRAMUSCULAR | Status: AC
  Filled 2013-01-31: qty 2

## 2013-01-31 MED ORDER — ONDANSETRON HCL 4 MG/2ML IJ SOLN
4.0000 mg | Freq: Once | INTRAMUSCULAR | Status: AC
  Administered 2013-01-31: 4 mg via INTRAVENOUS

## 2013-01-31 MED ORDER — LIDOCAINE HCL (CARDIAC) 10 MG/ML IV SOLN
INTRAVENOUS | Status: DC | PRN
  Administered 2013-01-31: 50 mg via INTRAVENOUS

## 2013-01-31 MED ORDER — SODIUM CHLORIDE 0.9 % IR SOLN
Status: DC | PRN
  Administered 2013-01-31 (×2): 3000 mL

## 2013-01-31 MED ORDER — FENTANYL CITRATE 0.05 MG/ML IJ SOLN
INTRAMUSCULAR | Status: DC | PRN
  Administered 2013-01-31 (×5): 50 ug via INTRAVENOUS

## 2013-01-31 MED ORDER — ONDANSETRON HCL 4 MG/2ML IJ SOLN: 4.0000 mg | Freq: Once | INTRAMUSCULAR | Status: DC | PRN

## 2013-01-31 MED ORDER — BUPIVACAINE-EPINEPHRINE PF 0.5-1:200000 % IJ SOLN
INTRAMUSCULAR | Status: AC
  Filled 2013-01-31: qty 20

## 2013-01-31 MED ORDER — MIDAZOLAM HCL 5 MG/5ML IJ SOLN
INTRAMUSCULAR | Status: DC | PRN
  Administered 2013-01-31: 2 mg via INTRAVENOUS

## 2013-01-31 MED ORDER — OXYCODONE-ACETAMINOPHEN 5-325 MG PO TABS: 1.0000 | ORAL_TABLET | ORAL | Status: DC | PRN

## 2013-01-31 MED ORDER — SODIUM CHLORIDE 0.9 % IR SOLN
Status: DC | PRN
  Administered 2013-01-31: 14:00:00

## 2013-01-31 MED ORDER — CEFAZOLIN SODIUM-DEXTROSE 2-3 GM-% IV SOLR: 2.0000 g | INTRAVENOUS | Status: DC

## 2013-01-31 MED ORDER — MIDAZOLAM HCL 2 MG/2ML IJ SOLN
1.0000 mg | INTRAMUSCULAR | Status: DC | PRN
  Administered 2013-01-31: 2 mg via INTRAVENOUS

## 2013-01-31 MED ORDER — KETOROLAC TROMETHAMINE 30 MG/ML IJ SOLN
INTRAMUSCULAR | Status: AC
  Filled 2013-01-31: qty 1

## 2013-01-31 MED ORDER — PROPOFOL 10 MG/ML IV EMUL
INTRAVENOUS | Status: DC | PRN
  Administered 2013-01-31: 50 mg via INTRAVENOUS
  Administered 2013-01-31: 150 mg via INTRAVENOUS

## 2013-01-31 MED ORDER — MIDAZOLAM HCL 2 MG/2ML IJ SOLN
INTRAMUSCULAR | Status: AC
  Filled 2013-01-31: qty 2

## 2013-01-31 MED ORDER — LIDOCAINE HCL (PF) 1 % IJ SOLN
INTRAMUSCULAR | Status: AC
  Filled 2013-01-31: qty 5

## 2013-01-31 MED ORDER — CEFAZOLIN SODIUM 1-5 GM-% IV SOLN: 1.0000 g | INTRAVENOUS | Status: DC

## 2013-01-31 MED ORDER — DEXTROSE 5 % IV SOLN
3.0000 g | INTRAVENOUS | Status: DC | PRN
  Administered 2013-01-31: 3 g via INTRAVENOUS

## 2013-01-31 MED ORDER — BUPIVACAINE-EPINEPHRINE PF 0.5-1:200000 % IJ SOLN
INTRAMUSCULAR | Status: DC | PRN
  Administered 2013-01-31: 60 mL

## 2013-01-31 MED ORDER — KETOROLAC TROMETHAMINE 30 MG/ML IJ SOLN
30.0000 mg | Freq: Once | INTRAMUSCULAR | Status: AC
  Administered 2013-01-31: 30 mg via INTRAVENOUS

## 2013-01-31 MED ORDER — DEXTROSE 5 % IV SOLN: 3.0000 g | INTRAVENOUS | Status: DC

## 2013-01-31 MED ORDER — OXYCODONE HCL 5 MG PO TABS
5.0000 mg | ORAL_TABLET | Freq: Once | ORAL | Status: AC
  Administered 2013-01-31: 5 mg via ORAL

## 2013-01-31 MED ORDER — LACTATED RINGERS IV SOLN
INTRAVENOUS | Status: DC | PRN
  Administered 2013-01-31 (×2): via INTRAVENOUS

## 2013-01-31 MED ORDER — FENTANYL CITRATE 0.05 MG/ML IJ SOLN: 25.0000 ug | INTRAMUSCULAR | Status: DC | PRN

## 2013-01-31 MED ORDER — OXYCODONE HCL 5 MG PO TABS
ORAL_TABLET | ORAL | Status: AC
  Filled 2013-01-31: qty 1

## 2013-01-31 MED ORDER — ARTIFICIAL TEARS OP OINT
TOPICAL_OINTMENT | OPHTHALMIC | Status: AC
  Filled 2013-01-31: qty 3.5

## 2013-01-31 MED ORDER — PROPOFOL 10 MG/ML IV EMUL
INTRAVENOUS | Status: AC
  Filled 2013-01-31: qty 20

## 2013-01-31 MED ORDER — LACTATED RINGERS IV SOLN
INTRAVENOUS | Status: DC
  Administered 2013-01-31: 12:00:00 via INTRAVENOUS

## 2013-01-31 SURGICAL SUPPLY — 44 items
BAG HAMPER (MISCELLANEOUS) ×2 IMPLANT
BANDAGE ELASTIC 6 VELCRO NS (GAUZE/BANDAGES/DRESSINGS) ×2 IMPLANT
BLADE AGGRESSIVE PLUS 4.0 (BLADE) ×2 IMPLANT
BLADE SURG SZ11 CARB STEEL (BLADE) ×2 IMPLANT
CHLORAPREP W/TINT 26ML (MISCELLANEOUS) ×4 IMPLANT
CLOTH BEACON ORANGE TIMEOUT ST (SAFETY) ×2 IMPLANT
COOLER CRYO IC GRAV AND TUBE (ORTHOPEDIC SUPPLIES) ×2 IMPLANT
CUFF CRYO KNEE LG 20X31 COOLER (ORTHOPEDIC SUPPLIES) ×2 IMPLANT
CUFF TOURNIQUET SINGLE 34IN LL (TOURNIQUET CUFF) ×2 IMPLANT
DECANTER SPIKE VIAL GLASS SM (MISCELLANEOUS) ×4 IMPLANT
GAUZE SPONGE 4X4 16PLY XRAY LF (GAUZE/BANDAGES/DRESSINGS) ×2 IMPLANT
GAUZE XEROFORM 5X9 LF (GAUZE/BANDAGES/DRESSINGS) ×2 IMPLANT
GLOVE BIOGEL PI IND STRL 7.0 (GLOVE) ×1 IMPLANT
GLOVE BIOGEL PI INDICATOR 7.0 (GLOVE) ×1
GLOVE SKINSENSE NS SZ8.0 LF (GLOVE) ×1
GLOVE SKINSENSE STRL SZ8.0 LF (GLOVE) ×1 IMPLANT
GLOVE SS BIOGEL STRL SZ 6.5 (GLOVE) ×1 IMPLANT
GLOVE SS N UNI LF 8.5 STRL (GLOVE) ×2 IMPLANT
GLOVE SUPERSENSE BIOGEL SZ 6.5 (GLOVE) ×1
GOWN STRL REIN XL XLG (GOWN DISPOSABLE) ×6 IMPLANT
HLDR LEG FOAM (MISCELLANEOUS) ×1 IMPLANT
IV NS IRRIG 3000ML ARTHROMATIC (IV SOLUTION) ×4 IMPLANT
KIT BLADEGUARD II DBL (SET/KITS/TRAYS/PACK) ×2 IMPLANT
KIT ROOM TURNOVER AP CYSTO (KITS) ×2 IMPLANT
LEG HOLDER FOAM (MISCELLANEOUS) ×1
MANIFOLD NEPTUNE II (INSTRUMENTS) ×2 IMPLANT
MARKER SKIN DUAL TIP RULER LAB (MISCELLANEOUS) ×2 IMPLANT
NEEDLE HYPO 18GX1.5 BLUNT FILL (NEEDLE) ×2 IMPLANT
NEEDLE HYPO 21X1.5 SAFETY (NEEDLE) ×2 IMPLANT
NEEDLE SPNL 18GX3.5 QUINCKE PK (NEEDLE) ×2 IMPLANT
NS IRRIG 1000ML POUR BTL (IV SOLUTION) ×2 IMPLANT
PACK ARTHRO LIMB DRAPE STRL (MISCELLANEOUS) ×2 IMPLANT
PAD ABD 5X9 TENDERSORB (GAUZE/BANDAGES/DRESSINGS) ×2 IMPLANT
PAD ARMBOARD 7.5X6 YLW CONV (MISCELLANEOUS) ×2 IMPLANT
PADDING CAST COTTON 6X4 STRL (CAST SUPPLIES) ×2 IMPLANT
SET ARTHROSCOPY INST (INSTRUMENTS) ×2 IMPLANT
SET ARTHROSCOPY PUMP TUBE (IRRIGATION / IRRIGATOR) ×2 IMPLANT
SET BASIN LINEN APH (SET/KITS/TRAYS/PACK) ×2 IMPLANT
SPONGE GAUZE 4X4 12PLY (GAUZE/BANDAGES/DRESSINGS) ×2 IMPLANT
SUT ETHILON 3 0 FSL (SUTURE) ×2 IMPLANT
SYR 30ML LL (SYRINGE) ×2 IMPLANT
SYRINGE 10CC LL (SYRINGE) ×2 IMPLANT
WAND 50 DEG COVAC W/CORD (SURGICAL WAND) ×2 IMPLANT
YANKAUER SUCT BULB TIP 10FT TU (MISCELLANEOUS) ×6 IMPLANT

## 2013-01-31 NOTE — Op Note (Signed)
01/31/2013  2:12 PM  PATIENT:  Jacob Hanna  52 y.o. male  PRE-OPERATIVE DIAGNOSIS:  MEDIAL MENISCAL TEAR LEFT KNEE  POST-OPERATIVE DIAGNOSIS:  MEDIAL MENISCAL TEAR LEFT KNEE; PLICA; DEGENERATIVE JOINT DISEASE LEFT KNEE  PROCEDURE:  Procedure(s): KNEE ARTHROSCOPY WITH PARTIAL  MEDIAL MENISECTOMY (Left) KNEE ARTHROSCOPY WITH EXCISION PLICA  FINDINGS: TEAR POSTERIOR HORN MEDIAL MENISCUS, LARGE PLICA - MEDIAL, DIFFUSE DEGENERATIVE JOINT DISEASE   DETAILS: The patient was identified in the preoperative area and his left knee was confirmed as surgical site marked. Chart update was completed.  The patient was taken to the operating room for general anesthesia. In the supine position his left leg was placed in an arthroscopic leg holder and the right leg was padded and left in flexion.  Sterile prep and drape was performed. Timeout was completed. A lateral portal was established and the scope was introduced into the suprapatellar compartment. A diagnostic arthroscopy started tear. After circumferential visualization of the joint a medial portal was established and a probe was placed into the joint.  Although a large tear was not found there was a tear through the meniscus and also at the body and posterior horn of the meniscus. This was resected with the arthroscopic duckbill forceps and the meniscal fragments were removed with a motorized shaver. A 50 ArthroCare wand was used to complete the balancing of the meniscus and this was confirmed to have a stable rim with a probe.  A plica was found medially starting at the medial capsule and connecting to the anterior fat pad. This was resected with the shaver.  The knee was then washed with the arthroscopic pump.  Portals were closed with 3-0 nylon sutures in Marcaine with epinephrine 60 cc was injected into the joint  Postoperative plan is for full weightbearing as tolerated with crutches or walker. Pain medication as needed. Ice therapy with  Cryo/Cuff SURGEON:  Surgeon(s) and Role:    * Vickki Hearing, MD - Primary  PHYSICIAN ASSISTANT:   ASSISTANTS: none   ANESTHESIA:   general  EBL:  Total I/O In: 1200 [I.V.:1200] Out: -   BLOOD ADMINISTERED:none  DRAINS: none   LOCAL MEDICATIONS USED:  MARCAINE 0.5% WITH EPI  and Amount: 60 ml  SPECIMEN:  No Specimen  DISPOSITION OF SPECIMEN:  N/A  COUNTS:  YES  TOURNIQUET:    DICTATION: .Dragon Dictation  PLAN OF CARE: Discharge to home after PACU  PATIENT DISPOSITION:  PACU - hemodynamically stable.   Delay start of Pharmacological VTE agent (>24hrs) due to surgical blood loss or risk of bleeding: NOT APPLICABLE

## 2013-01-31 NOTE — Interval H&P Note (Signed)
History and Physical Interval Note:  01/31/2013 1:06 PM  Jacob Hanna  has presented today for surgery, with the diagnosis of MEDIAL MENISCAL TEAR LEFT KNEE  The various methods of treatment have been discussed with the patient and family. After consideration of risks, benefits and other options for treatment, the patient has consented to  Procedure(s): KNEE ARTHROSCOPY WITH MEDIAL MENISECTOMY (Left) as a surgical intervention .  The patient's history has been reviewed, patient examined, no change in status, stable for surgery.  I have reviewed the patient's chart and labs.  Questions were answered to the patient's satisfaction.     Almir Botts  Left knee arthroscopy medial menisectomy

## 2013-01-31 NOTE — Brief Op Note (Signed)
01/31/2013  2:12 PM  PATIENT:  Jacob Hanna  52 y.o. male  PRE-OPERATIVE DIAGNOSIS:  MEDIAL MENISCAL TEAR LEFT KNEE  POST-OPERATIVE DIAGNOSIS:  MEDIAL MENISCAL TEAR LEFT KNEE; PLICA; DEGENERATIVE JOINT DISEASE LEFT KNEE  PROCEDURE:  Procedure(s): KNEE ARTHROSCOPY WITH PARTIAL  MEDIAL MENISECTOMY (Left) KNEE ARTHROSCOPY WITH EXCISION PLICA  FINDINGS: TEAR POSTERIOR HORN MEDIAL MENISCUS, LARGE PLICA - MEDIAL, DIFFUSE DEGENERATIVE JOINT DISEASE   SURGEON:  Surgeon(s) and Role:    * Vickki Hearing, MD - Primary  PHYSICIAN ASSISTANT:   ASSISTANTS: none   ANESTHESIA:   general  EBL:  Total I/O In: 1200 [I.V.:1200] Out: -   BLOOD ADMINISTERED:none  DRAINS: none   LOCAL MEDICATIONS USED:  MARCAINE 0.5% WITH EPI  and Amount: 60 ml  SPECIMEN:  No Specimen  DISPOSITION OF SPECIMEN:  N/A  COUNTS:  YES  TOURNIQUET:    DICTATION: .Dragon Dictation  PLAN OF CARE: Discharge to home after PACU  PATIENT DISPOSITION:  PACU - hemodynamically stable.   Delay start of Pharmacological VTE agent (>24hrs) due to surgical blood loss or risk of bleeding: NOT APPLICABLE

## 2013-01-31 NOTE — Transfer of Care (Signed)
Immediate Anesthesia Transfer of Care Note  Patient: Jacob Hanna  Procedure(s) Performed: Procedure(s): KNEE ARTHROSCOPY WITH PARTIAL  MEDIAL MENISECTOMY (Left) KNEE ARTHROSCOPY WITH EXCISION PLICA (Left)  Patient Location: PACU  Anesthesia Type:General  Level of Consciousness: awake, alert , oriented and patient cooperative  Airway & Oxygen Therapy: Patient Spontanous Breathing  Post-op Assessment: Report given to PACU RN and Post -op Vital signs reviewed and stable  Post vital signs: Reviewed and stable  Complications: No apparent anesthesia complications

## 2013-01-31 NOTE — H&P (Signed)
Jacob Hanna is an 52 y.o. male.   Chief Complaint   Patient presents with   .  Knee Pain       Left knee pain, fell bowling 6 weeks ago Referred by Dr. Lilyan Hanna     History:   52 year old male who is a avid bowler was bowling and [name] and slipped in his knee twisted into a funny position and about 3 days later he started having acute sharp severe 8/10 medial knee pain has had it for 6 weeks despite anti-inflammatories and rest. The pain is sharp throbbing stabbing intermittent seems to be worse with bending his knee and walking. Hips are for him to support his weight. He has swelling of the joint.  He has a history of anemia secondary to blood in the stool he had a capsule study which showed no bleeding is on iron all other systems were reviewed and were normal   Past Medical History  Diagnosis Date  . Anemia     Past Surgical History  Procedure Laterality Date  . Givens capsule study  09/07/2012    Procedure: GIVENS CAPSULE STUDY;  Surgeon: Jacob Hippo, MD;  Location: [facility];  Service: Endoscopy;  Laterality: N/A;  730  . Colonoscopy    . Esophagogastroduodenoscopy      No family history on file. Social History:  reports that he has never smoked. He does not have any smokeless tobacco history on file. He reports that he does not drink alcohol or use illicit drugs.  Allergies: No Known Allergies  No prescriptions prior to admission    No results found for this or any previous visit (from the past 48 hour(s)). No results found.  ROS see above hpi  There were no vitals taken for this visit. Physical Exam  BP 118/76  Ht 5\' 11"  (1.803 m)  Wt 234 lb (106.142 kg)  BMI 32.65 kg/m2 General appearance is normal, the patient is alert and oriented x3 with normal mood and affect. Gait:  significant limp he cannot bear weight  Has a moderate joint effusion he holds his knee extended actually has painful flexion after 80 of flexion  ligaments appear stable motor exams intact skin is normal  Tenderness over the medial joint line severe it was difficult to get a handle on his medial collateral ligament he did have a positive McMurray's  His opposite knee was normal, no tenderness or swelling full range of motion all ligaments are stable muscle tone is normal skin is intact  Upper extremity exam  The right and left upper extremity:     Inspection and palpation revealed no abnormalities in the upper extremities.    Range of motion is full without contracture.  Motor exam is normal with grade 5 strength.  The joints are fully reduced without subluxation.  There is no atrophy or tremor and muscle tone is normal.  All joints are stable.   Cardiovascular pulses are normal sensation is intact no lymphadenopathy normal reflexes and normal coordination  X-ray shows mild degenerative change medially with effusion  Impression Acute medial meniscus tear of left knee, initial encounter  MRI:  IMPRESSION:  1. Large oblique tear posterior horn and body of the medial  meniscus. The anterior horn of the medial meniscus is markedly  degenerated without focal tear.  2. Mild appearing degenerative change about the knee.  3. Minimal marrow edema in the anterior cortex of the patella  could be due to contusion given  history of trauma.  4. Subtle cortical irregularity along the lateral patellar facet  may be due to the patient's prior patellar fracture.   Assessment/Plan Torn medial meniscus with degenerative arthritis left knee  The informed consent process involving the following discussion:  Arthroscopy left knee partial medial meniscectomy   You have been scheduled for arthroscocpic knee surgery.  All surgeries carry some risk.  Remember you always have the option of continued nonsurgical treatment. However in this situation the risks vs. the benefits favor surgery as the best treatment option. The risks of the  surgery includes the following but is not limited to bleeding, infection, pulmonary embolus, death from anesthesia, nerve injury vascular injury or need for further surgery, continued pain.  Specific to this procedure the following risks and complications are rare but possible Stiffness, pain, weakness, giving out  I expect  recovery will be in 3-4 weeks some patients take 6 weeks.  You  will need physical therapy after the procedure  Stop any blood thinning medication: such as warfarin, coumadin, naprosyn, ibuprofen, advil, diclofenac, aspirin    Jacob Hanna 01/31/2013, 7:51 AM

## 2013-01-31 NOTE — Anesthesia Procedure Notes (Signed)
Procedure Name: LMA Insertion Date/Time: 01/31/2013 1:23 PM Performed by: Carolyne Littles, Shylynn Bruning L Pre-anesthesia Checklist: Patient identified, Patient being monitored, Emergency Drugs available, Timeout performed and Suction available Patient Re-evaluated:Patient Re-evaluated prior to inductionOxygen Delivery Method: Circle system utilized Preoxygenation: Pre-oxygenation with 100% oxygen Intubation Type: IV induction Ventilation: Mask ventilation without difficulty LMA: LMA inserted LMA Size: 5.0 Number of attempts: 1 Placement Confirmation: positive ETCO2 and breath sounds checked- equal and bilateral Tube secured with: Tape Dental Injury: Teeth and Oropharynx as per pre-operative assessment

## 2013-01-31 NOTE — Anesthesia Preprocedure Evaluation (Signed)
Anesthesia Evaluation  Patient identified by MRN, date of birth, ID band Patient awake    Reviewed: Allergy & Precautions, H&P , NPO status , Patient's Chart, lab work & pertinent test results  Airway Mallampati: I TM Distance: >3 FB     Dental  (+) Teeth Intact   Pulmonary neg pulmonary ROS,  breath sounds clear to auscultation        Cardiovascular negative cardio ROS  Rhythm:Regular Rate:Normal     Neuro/Psych    GI/Hepatic   Endo/Other    Renal/GU      Musculoskeletal   Abdominal   Peds  Hematology  (+) Blood dyscrasia, anemia ,   Anesthesia Other Findings   Reproductive/Obstetrics                           Anesthesia Physical Anesthesia Plan  ASA: II  Anesthesia Plan: General   Post-op Pain Management:    Induction: Intravenous  Airway Management Planned: LMA  Additional Equipment:   Intra-op Plan:   Post-operative Plan: Extubation in OR  Informed Consent: I have reviewed the patients History and Physical, chart, labs and discussed the procedure including the risks, benefits and alternatives for the proposed anesthesia with the patient or authorized representative who has indicated his/her understanding and acceptance.     Plan Discussed with:   Anesthesia Plan Comments:         Anesthesia Quick Evaluation  

## 2013-01-31 NOTE — Anesthesia Postprocedure Evaluation (Signed)
  Anesthesia Post-op Note  Patient: [name]  Procedure(s) Performed: Procedure(s): KNEE ARTHROSCOPY WITH PARTIAL  MEDIAL MENISECTOMY (Left) KNEE ARTHROSCOPY WITH EXCISION PLICA (Left)  Patient Location: PACU  Anesthesia Type:General  Level of Consciousness: awake, alert , oriented and patient cooperative  Airway and Oxygen Therapy: Patient Spontanous Breathing and Patient connected to face mask oxygen  Post-op Pain: mild  Post-op Assessment: Post-op Vital signs reviewed, Patient's Cardiovascular Status Stable, Respiratory Function Stable, Patent Airway, No signs of Nausea or vomiting and Pain level controlled  Post-op Vital Signs: Reviewed and stable  Complications: No apparent anesthesia complications

## 2013-01-31 NOTE — Preoperative (Signed)
Beta Blockers   Reason not to administer Beta Blockers:Not Applicable 

## 2013-02-01 ENCOUNTER — Telehealth: Payer: Self-pay | Admitting: Orthopedic Surgery

## 2013-02-01 NOTE — Telephone Encounter (Signed)
Each time Jacob Hanna takes the Percocet he is getting nauseous and gets a bad headache.  Asking if you can prescribe something else for the pain. He uses [employer], [facility] # [phone]

## 2013-02-02 ENCOUNTER — Other Ambulatory Visit: Payer: Self-pay | Admitting: *Deleted

## 2013-02-02 DIAGNOSIS — Z9889 Other specified postprocedural states: Secondary | ICD-10-CM

## 2013-02-02 MED ORDER — HYDROCODONE-ACETAMINOPHEN 5-325 MG PO TABS: 1.0000 | ORAL_TABLET | ORAL | Status: DC | PRN

## 2013-02-02 NOTE — Telephone Encounter (Signed)
Faxed prescription per Dr. Romeo Apple to [facility]

## 2013-02-02 NOTE — Telephone Encounter (Signed)
noroc 5 1 q 4 prn pain # 42

## 2013-02-03 ENCOUNTER — Encounter (HOSPITAL_COMMUNITY): Payer: Self-pay | Admitting: Orthopedic Surgery

## 2013-02-06 ENCOUNTER — Ambulatory Visit (INDEPENDENT_AMBULATORY_CARE_PROVIDER_SITE_OTHER): Payer: Managed Care, Other (non HMO) | Admitting: Orthopedic Surgery

## 2013-02-06 ENCOUNTER — Encounter: Payer: Self-pay | Admitting: Orthopedic Surgery

## 2013-02-06 VITALS — BP 122/58 | Ht 71.0 in | Wt 234.0 lb

## 2013-02-06 DIAGNOSIS — M23322 Other meniscus derangements, posterior horn of medial meniscus, left knee: Secondary | ICD-10-CM

## 2013-02-06 DIAGNOSIS — M23329 Other meniscus derangements, posterior horn of medial meniscus, unspecified knee: Secondary | ICD-10-CM

## 2013-02-06 NOTE — Progress Notes (Signed)
Chief Complaint  Patient presents with  . Follow-up    Post op 1 left knee arthroscopy with medial menisectomy DOS 01/31/13    BP 122/58  Ht 5\' 11"  (1.803 m)  Wt 234 lb (106.142 kg)  BMI 32.65 kg/m2  Postop visit   S/P knee arthroscopy   DX Medial meniscus, posterior horn derangement, left - Plan: Ambulatory referral to Physical Therapy   Operative Findings medial meniscus, plica, diffuse arthritis  Complaints stiffness  Plan PT

## 2013-02-06 NOTE — Patient Instructions (Addendum)
Call hospital to arrange PT   RTW: April 7th light duty x 2 weeks  Then full duty

## 2013-02-09 ENCOUNTER — Ambulatory Visit (HOSPITAL_COMMUNITY)
Admission: RE | Admit: 2013-02-09 | Discharge: 2013-02-09 | Disposition: A | Discharge status: Home / Self Care | Payer: Managed Care, Other (non HMO) | Source: Ambulatory Visit | Attending: Orthopedic Surgery | Admitting: Orthopedic Surgery

## 2013-02-09 DIAGNOSIS — M25569 Pain in unspecified knee: Secondary | ICD-10-CM | POA: Insufficient documentation

## 2013-02-09 DIAGNOSIS — IMO0001 Reserved for inherently not codable concepts without codable children: Secondary | ICD-10-CM | POA: Insufficient documentation

## 2013-02-09 DIAGNOSIS — M6281 Muscle weakness (generalized): Secondary | ICD-10-CM | POA: Insufficient documentation

## 2013-02-09 NOTE — Evaluation (Signed)
Physical Therapy Evaluation  Patient Details  Name: Jacob Hanna MRN: [id number] Date of Birth: 1961/10/09  Today's Date: 02/09/2013 Time: 1355-1430 PT Time Calculation (min): 35 min Charge:  eval             Visit#: 1 of 9  Re-eval: 03/03/13 Assessment Diagnosis: L knee arthroscopic surgery Surgical Date: 01/31/13 Next MD Visit: 02/20/2013 Prior Therapy: none  Authorization: [employer]     Past Medical History:  Past Medical History  Diagnosis Date  . Anemia    Past Surgical History:  Past Surgical History  Procedure Laterality Date  . Givens capsule study  09/07/2012    Procedure: GIVENS CAPSULE STUDY;  Surgeon: Malissa Hippo, MD;  Location: [facility];  Service: Endoscopy;  Laterality: N/A;  730  . Colonoscopy    . Esophagogastroduodenoscopy    . Knee arthroscopy with medial menisectomy Left 01/31/2013    Procedure: KNEE ARTHROSCOPY WITH PARTIAL  MEDIAL MENISECTOMY;  Surgeon: Vickki Hearing, MD;  Location: [facility];  Service: Orthopedics;  Laterality: Left;  . Knee arthroscopy with excision plica Left 01/31/2013    Procedure: KNEE ARTHROSCOPY WITH EXCISION PLICA;  Surgeon: Vickki Hearing, MD;  Location: [facility];  Service: Orthopedics;  Laterality: Left;    Subjective Symptoms/Limitations Symptoms: Jacob Hanna states that he was bowling when he fell and injured his knee on January 25th 2014.  Marland Kitchen  He opted to have arthroscopic surgery on his knee on 01/31/2013. He states he is able to bend his knee more but he is still having stabbing pains he states he is hurting the post along the posterior aspect of his knee. How long can you sit comfortably?: Pt sits with his leg out straight.  If he needs to sit with his knee bent he begins to fidget about 20 minutes to a half hour. How long can you stand comfortably?: Standing is no problen How long can you walk comfortably?: Walking increases the pain after 10-15 minutes.  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   Sensation/Coordination/Flexibility/Functional Tests Functional Tests Functional Tests: LEFS 63/80  Assessment LLE AROM (degrees) Left Knee Extension: 5 Left Knee Flexion: 110 LLE Strength Left Hip Flexion: 5/5 Left Hip Extension: 4/5 Left Hip ABduction: 5/5 Left Hip ADduction: 4/5 Left Knee Flexion: 4/5 Left Knee Extension: 4/5 Left Ankle Dorsiflexion: 5/5  Exercise/Treatments    Stretches Active Hamstring Stretch: 3 reps;30 seconds Seated Long Arc Quad: 10 reps Supine Quad Sets: 10 reps Heel Slides: 10 reps Terminal Knee Extension: 10 reps Prone  Hamstring Curl: 10 reps Hip Extension: 10 reps   Modalities Modalities: Ultrasound Ultrasound Ultrasound Location: posterior aspect  Ultrasound Parameters: 1 MHz 1.3 w/cm2 Ultrasound Goals: Pain  Physical Therapy Assessment and Plan PT Assessment and Plan Clinical Impression Statement: Pt s/p arthroscopic surgery with increased pain, decreased ROM and decreased strength who will benefit from skilled theapy to address the above issues to allow pt to return to previous lifestyle.   Pt will benefit from skilled therapeutic intervention in order to improve on the following deficits: Abnormal gait;Difficulty walking;Decreased range of motion;Pain Rehab Potential: Good PT Frequency: Min 3X/week PT Duration:  (3 weeks) PT Treatment/Interventions: Gait training;Stair training;Functional mobility training;Therapeutic activities;Therapeutic exercise;Modalities;Manual techniques PT Plan: Begin nu-step, rockerboard, SLS, standing knee  flexion w/ 5#, lateral and forward step ups. terminal standing extension, quad stretch next treatment.  Pt is normally very active progress  accordingly.    Goals Home Exercise Program Pt will Perform Home Exercise Program: Independently PT Short Term Goals Time to Complete Short Term Goals:  (10 days.) PT Short Term Goal 1: Pt ROM 0-125 to allow pt to have a normalized gait as well as be able to squat to the floor to pick up items PT Long Term Goals Time to Complete Long Term Goals:  (3 weeks) PT Long Term Goal 1: I in advance HEP PT Long Term Goal 2: Pt strength to be normal to allow pt to be on his feet all day without increased pain. Long Term Goal 3: Pt to be able to go on a 3 mile hike.  Problem List Patient Active Problem List  Diagnosis  . Iron deficiency anemia  . Guaiac positive stools  . GI bleed  . Acute medial meniscus tear of left knee  . Medial meniscus, posterior horn derangement  . Synovial plica of left knee    General Behavior During Session: [facility] for tasks performed Cognition: [facility] for tasks performed PT Plan of Care PT Home Exercise Plan: given Consulted and Agree with Plan of Care: Patient  GP    RUSSELL,CINDY 02/09/2013, 4:48 PM  Physician Documentation Your signature is required to indicate approval of the treatment plan as stated above.  Please sign and either send electronically or make a copy of this report for your files and return this physician signed original.   Please mark one 1.__approve of plan  2. ___approve of plan with the following conditions.   ______________________________                                                          _____________________ Physician Signature                                                                                                             Date

## 2013-02-14 ENCOUNTER — Ambulatory Visit (HOSPITAL_COMMUNITY)
Admission: RE | Admit: 2013-02-14 | Discharge: 2013-02-14 | Disposition: A | Discharge status: Home / Self Care | Payer: Managed Care, Other (non HMO) | Source: Ambulatory Visit | Attending: Orthopedic Surgery | Admitting: Orthopedic Surgery

## 2013-02-14 NOTE — Progress Notes (Signed)
Physical Therapy Treatment Patient Details  Name: Jacob Hanna MRN: [id number] Date of Birth: November 23, 1960  Today's Date: 02/14/2013 Time: 7829-5621 PT Time Calculation (min): 48 min Charge: therex 38', [place] 8'  Visit#: 2 of 9  Re-eval: 03/03/13 Assessment Diagnosis: L knee arthroscopic surgery Surgical Date: 01/31/13 Next MD Visit: Romeo Apple 02/16/2013 Prior Therapy: none  Authorization: [employer]    Subjective: Symptoms/Limitations Symptoms: Pt reported compliance with HEP once a day.  Stated soreness today, pain scale 3/10 Pain Assessment Currently in Pain?: Yes Pain Score:   3 Pain Location: Knee Pain Orientation: Left  Objective:  Exercise/Treatments Stretches Active Hamstring Stretch: 3 reps;30 seconds;Limitations Active Hamstring Stretch Limitations: with rope Quad Stretch: 3 reps;30 seconds Aerobic Stationary Bike: NuStep x 8 min SPM average 100 Level 3 Standing Heel Raises: 10 reps;Limitations Heel Raises Limitations: toe raises  Knee Flexion: 10 reps;Limitations Knee Flexion Limitations: 5# Terminal Knee Extension: Left;10 reps;Theraband;Limitations Theraband Level (Terminal Knee Extension): Level 4 (Blue) Terminal Knee Extension Limitations: 10" holds Lateral Step Up: Left;10 reps;Hand Hold: 2;Step Height: 2" Forward Step Up: Left;10 reps;Hand Hold: 1;Step Height: 4" Rocker Board: 2 minutes;Limitations Rocker Board Limitations: R/L  SLS: 60" first attempt/ begin on dynamic surface next session Supine Quad Sets: 10 reps Terminal Knee Extension: 10 reps   Modalities Modalities: Ultrasound Ultrasound Ultrasound Location: posterior aspect (in prone) Ultrasound Parameters: 1 MHz 1.3 w/cm2 continuous Ultrasound Goals: Pain  Physical Therapy Assessment and Plan PT Assessment and Plan Clinical Impression Statement: Began POC per PT plan for Lt knee strengthening, stretching and pain relief.  Pt able to demonstrate appropriate techniques with exercises  following minor cues for proper technique.  Noted visible quad fatigue with activities.  Pt reported pain reduced to 2/10 at end of session following continuous [place] in prone position. PT Plan: Continue with current POC.  Progress SLS to dynamic surface or begin vector stance next session.    Goals    Problem List Patient Active Problem List  Diagnosis  . Iron deficiency anemia  . Guaiac positive stools  . GI bleed  . Acute medial meniscus tear of left knee  . Medial meniscus, posterior horn derangement  . Synovial plica of left knee    PT - End of Session Activity Tolerance: Patient tolerated treatment well General Behavior During Session: [facility] for tasks performed Cognition: [facility] for tasks performed  GP    Juel Burrow 02/14/2013, 5:04 PM

## 2013-02-16 ENCOUNTER — Ambulatory Visit (INDEPENDENT_AMBULATORY_CARE_PROVIDER_SITE_OTHER): Payer: Managed Care, Other (non HMO) | Admitting: Orthopedic Surgery

## 2013-02-16 ENCOUNTER — Ambulatory Visit (HOSPITAL_COMMUNITY)
Admission: RE | Admit: 2013-02-16 | Discharge: 2013-02-16 | Disposition: A | Discharge status: Home / Self Care | Payer: Managed Care, Other (non HMO) | Source: Ambulatory Visit | Attending: *Deleted | Admitting: *Deleted

## 2013-02-16 VITALS — BP 110/60 | Ht 71.0 in | Wt 234.0 lb

## 2013-02-16 DIAGNOSIS — M6752 Plica syndrome, left knee: Secondary | ICD-10-CM

## 2013-02-16 DIAGNOSIS — M6750 Plica syndrome, unspecified knee: Secondary | ICD-10-CM

## 2013-02-16 DIAGNOSIS — M23322 Other meniscus derangements, posterior horn of medial meniscus, left knee: Secondary | ICD-10-CM

## 2013-02-16 DIAGNOSIS — M23329 Other meniscus derangements, posterior horn of medial meniscus, unspecified knee: Secondary | ICD-10-CM

## 2013-02-16 NOTE — Patient Instructions (Signed)
Continue therapy

## 2013-02-16 NOTE — Progress Notes (Signed)
Patient ID: Jacob Hanna, male   DOB: 08-Jan-1961, 52 y.o.   MRN: [id number] Chief Complaint  Patient presents with  . Follow-up    SALK    The patient is having swelling which is inhibiting his overall rehabilitation process please been able to maintain his flexion and extension. He is ambulating with a slight limp  Recommend changing rehabilitation as he is doing too much and too sophisticated exercise program  Changed to obtained and basic quad sets and range of motion strengthening exercises  Return in a month continue ice decrease activities as swelling indicates joint irritation

## 2013-02-16 NOTE — Progress Notes (Signed)
Physical Therapy Treatment Patient Details  Name: Jacob Hanna MRN: [id number] Date of Birth: [birth year]  Today's Date: 02/16/2013 Time: 1520-1602 PT Time Calculation (min): 42 min  Visit#: 3 of 9  Re-eval: 03/03/13 Charges: Therex 23' Manual x 8' Ultrasound x 8'  Authorization: [employer]    Subjective: Symptoms/Limitations Symptoms: Pt states that he was a little sore after last session. Pain Assessment Currently in Pain?: Yes Pain Score:   4 Pain Location: Knee Pain Orientation: Left   Exercise/Treatments Aerobic Elliptical: 5'L1 to increase quad strength and activity tolerance Standing Heel Raises: 10 reps;Limitations Heel Raises Limitations: toe raises  Lateral Step Up: Left;10 reps;Hand Hold: 2;Step Height: 2" Forward Step Up: Left;10 reps;Hand Hold: 1;Step Height: 4" Functional Squat: 10 reps (with multimodal cueing for form) Rocker Board: 2 minutes;Limitations Rocker Board Limitations: R/L  Rebounder: LLE SLS red bal x 10   Modalities Modalities: Ultrasound Manual Therapy Manual Therapy: Myofascial release Myofascial Release: MFR completed to left anterior medial knee to decrease tightness and adhesions Ultrasound Ultrasound Location: posterior aspect (in prone) Ultrasound Parameters: 1 MHz 1.3 w/cm2 continuous Ultrasound Goals: Pain  Physical Therapy Assessment and Plan PT Assessment and Plan Clinical Impression Statement: Pt completes therex well after initial demo for proper form and technique. Pt has increase pain/discomfort with knee flexion. Began elliptical to improve quad strength as NuStep was not challenging enough for pt. Pt tolerate elliptical well without complaint of increased pain. MFR completed to left anterior medial  knee to decrease tightness and adhesions. Ultrasound completed to posterior knee secondary to decreased pain with previous tx. Pt reports pain decrease to 2/10 at end of session. PT Plan: Continue with current POC.  Progress SLS  to dynamic surface or begin vector stance next session.     Problem List Patient Active Problem List  Diagnosis  . Iron deficiency anemia  . Guaiac positive stools  . GI bleed  . Acute medial meniscus tear of left knee  . Medial meniscus, posterior horn derangement  . Synovial plica of left knee    PT - End of Session Activity Tolerance: Patient tolerated treatment well General Behavior During Session: [facility] for tasks performed Cognition: [facility] for tasks performed  Seth Bake, PTA  02/16/2013, 4:32 PM

## 2013-02-21 ENCOUNTER — Ambulatory Visit (HOSPITAL_COMMUNITY): Payer: Managed Care, Other (non HMO) | Admitting: *Deleted

## 2013-02-22 ENCOUNTER — Telehealth: Payer: Self-pay | Admitting: Family Medicine

## 2013-02-22 NOTE — Telephone Encounter (Signed)
Per Clydie Braun @ [facility], AUTH# [id number] expires 05/16/13 for CPT 95806, home sleep test, LMOM to notify pt that [facility] would contact him to set up home sleep test, faxed order to [facility] sleep lab, all of this was initiated before "Epic", just wanted it documented in chart

## 2013-02-23 ENCOUNTER — Inpatient Hospital Stay (HOSPITAL_COMMUNITY)
Admission: RE | Admit: 2013-02-23 | Payer: Managed Care, Other (non HMO) | Source: Ambulatory Visit | Admitting: *Deleted

## 2013-02-28 ENCOUNTER — Ambulatory Visit (HOSPITAL_COMMUNITY)
Admission: RE | Admit: 2013-02-28 | Discharge: 2013-02-28 | Disposition: A | Discharge status: Home / Self Care | Payer: Managed Care, Other (non HMO) | Source: Ambulatory Visit | Attending: Orthopedic Surgery | Admitting: Orthopedic Surgery

## 2013-02-28 NOTE — Progress Notes (Signed)
Physical Therapy Treatment Patient Details  Name: Jacob Hanna MRN: [id number] Date of Birth: 1961-06-16  Today's Date: 02/28/2013 Time: 4098-1191 PT Time Calculation (min): 46 min Visit#: 4 of 9  Re-eval: 03/03/13 Authorization: [employer]  Charges:  therex 35', [place] 8'   Subjective: Symptoms/Limitations Symptoms: Per MD note on 4/10: "Recommend changing rehabilitation as he is doing too much and too sophisticated exercise program"  MD would like basic quad sets, ROM and strengthening exericises only..  PT reports continued soreness. States only has "pain" when using steps or with walking; touching his anterior medial Lt. knee is tender also Pain Assessment Currently in Pain?: Yes Pain Score:   4 Pain Location: Knee Pain Orientation: Left Pain Type: Acute pain   Exercise/Treatments Stretches Active Hamstring Stretch: 3 reps;30 seconds;Limitations Active Hamstring Stretch Limitations: with rope Aerobic Elliptical: 8' L1 to increase quad strength and activity tolerance Standing Heel Raises: 15 reps Heel Raises Limitations: toe raises  15 X SLS with Vectors: 5X5" LLE Supine Quad Sets: 10 reps Straight Leg Raises: 10 reps Sidelying Hip ABduction: 10 reps;Left Hip ADduction: 10 reps;Left Prone  Hamstring Curl: 15 reps Hip Extension: 15 reps   Modalities Modalities: Ultrasound Manual Therapy Manual Therapy: Myofascial release Myofascial Release: MFR completed to left anterior medial knee to decrease tightness and adhesions Ultrasound Ultrasound Location: Left anterior medial knee  Ultrasound Parameters: 1.2w/cm2 continuous Ultrasound Goals: Pain  Physical Therapy Assessment and Plan PT Assessment and Plan Clinical Impression Statement: Changed exercise program to less weight bearing activity, focusing on gentle strengthening and ROM exercises. Focused on anterior medial knee pain, switching [place] to this location to help decrease pain.  Pt reported no pain at end of  session.  Encouraged icing knee at home to help decrease sweling and pain. PT Plan: Continue with modified exercise program per MD; hold additional weight bearing activities at this time.     Problem List Patient Active Problem List  Diagnosis  . Iron deficiency anemia  . Guaiac positive stools  . GI bleed  . Acute medial meniscus tear of left knee  . Medial meniscus, posterior horn derangement  . Synovial plica of left knee    PT - End of Session Activity Tolerance: Patient tolerated treatment well General Behavior During Therapy: WFL for tasks assessed/performed Cognition: [facility] for tasks performed   Lurena Nida, PTA/CLT 02/28/2013, 4:58 PM

## 2013-03-02 ENCOUNTER — Ambulatory Visit (HOSPITAL_COMMUNITY)
Admission: RE | Admit: 2013-03-02 | Discharge: 2013-03-02 | Disposition: A | Discharge status: Home / Self Care | Payer: Managed Care, Other (non HMO) | Source: Ambulatory Visit | Attending: Orthopedic Surgery | Admitting: Orthopedic Surgery

## 2013-03-02 NOTE — Progress Notes (Signed)
Physical Therapy Treatment Patient Details  Name: Jacob Hanna MRN: [id number] Date of Birth: [birth year] Charge:  There ex 23; [place] 8'; manual 8' Today's Date: 03/02/2013 Time: 4098-1191 PT Time Calculation (min): 46 min  Visit#: 5 of 9  Re-eval: 03/03/13    Authorization: [employer]  Subjective: Symptoms/Limitations Symptoms: Pt states increased swelling due to tilling his garden.   Pain Assessment Pain Score:   4 Pain Location: Knee Pain Orientation: Left Pain Type: Acute pain     Exercise/Treatments  Stretches Active Hamstring Stretch: 3 reps;30 seconds;Limitations Active Hamstring Stretch Limitations: with rope Aerobic Elliptical: 8' L1 to increase quad strength and activity tolerance Supine Quad Sets: 15 reps Straight Leg Raises: 15 reps Sidelying Hip ABduction: 10 reps;Left Hip ADduction: 10 reps;Left Prone  Hamstring Curl: 15 reps Hip Extension: 5 sets;Limitations Hip Extension Limitations: decreased reps due to increase pain.   Modalities Modalities: Ultrasound Manual Therapy Manual Therapy: Myofascial release Myofascial Release: MFR completed to L anterior medial knee to decrease adhesions with two adhesions eliminated  Ultrasound Ultrasound Location: Lf anterior medial knee Ultrasound Parameters: 3 MHz 1.2 w/cm2 continuous Ultrasound Goals: Pain  Physical Therapy Assessment and Plan PT Assessment and Plan Clinical Impression Statement: Pt completed ex with good form.  Limited hip extension secondary to increased pain today.  Knee swellng slightly up secondary to tilling garden pt was advised to ice at home. PT Treatment/Interventions: Gait training;Stair training;Functional mobility training;Therapeutic activities;Therapeutic exercise;Modalities;Manual techniques PT Plan: Continue with modified exercise program per MD; hold additional weight bearing activities at this time.  Begin weights with NWB exercises    Goals Home Exercise Program Pt will  Perform Home Exercise Program: Independently PT Short Term Goals PT Short Term Goal 1: Pt ROM 0-125 to allow pt to have a normalized gait as well as be able to squat to the floor to pick up items PT Short Term Goal 1 - Progress: Progressing toward goal PT Long Term Goals PT Long Term Goal 1: I in advance HEP PT Long Term Goal 1 - Progress: Progressing toward goal PT Long Term Goal 2: Pt strength to be normal to allow pt to be on his feet all day without increased pain. PT Long Term Goal 2 - Progress: Progressing toward goal Long Term Goal 3: Pt to be able to go on a 3 mile hike. Long Term Goal 3 Progress: Progressing toward goal  Problem List Patient Active Problem List  Diagnosis  . Iron deficiency anemia  . Guaiac positive stools  . GI bleed  . Acute medial meniscus tear of left knee  . Medial meniscus, posterior horn derangement  . Synovial plica of left knee    PT - End of Session Activity Tolerance: Patient tolerated treatment well General Behavior During Therapy: WFL for tasks assessed/performed  GP    RUSSELL,CINDY 03/02/2013, 4:20 PM

## 2013-03-07 ENCOUNTER — Ambulatory Visit (HOSPITAL_COMMUNITY): Payer: Managed Care, Other (non HMO) | Admitting: Physical Therapy

## 2013-03-07 ENCOUNTER — Telehealth (HOSPITAL_COMMUNITY): Payer: Self-pay

## 2013-03-09 ENCOUNTER — Inpatient Hospital Stay (HOSPITAL_COMMUNITY)
Admission: RE | Admit: 2013-03-09 | Payer: Managed Care, Other (non HMO) | Source: Ambulatory Visit | Admitting: Physical Therapy

## 2013-03-15 ENCOUNTER — Other Ambulatory Visit (HOSPITAL_COMMUNITY): Payer: Self-pay

## 2013-03-15 DIAGNOSIS — G4710 Hypersomnia, unspecified: Secondary | ICD-10-CM

## 2013-03-15 DIAGNOSIS — G4730 Sleep apnea, unspecified: Secondary | ICD-10-CM

## 2013-03-21 ENCOUNTER — Ambulatory Visit (INDEPENDENT_AMBULATORY_CARE_PROVIDER_SITE_OTHER): Payer: Managed Care, Other (non HMO) | Admitting: Orthopedic Surgery

## 2013-03-21 ENCOUNTER — Encounter: Payer: Self-pay | Admitting: Orthopedic Surgery

## 2013-03-21 VITALS — BP 134/70 | Ht 71.0 in | Wt 234.0 lb

## 2013-03-21 DIAGNOSIS — Z9889 Other specified postprocedural states: Secondary | ICD-10-CM

## 2013-03-21 DIAGNOSIS — S83242D Other tear of medial meniscus, current injury, left knee, subsequent encounter: Secondary | ICD-10-CM

## 2013-03-21 DIAGNOSIS — Z5189 Encounter for other specified aftercare: Secondary | ICD-10-CM

## 2013-03-21 MED ORDER — PREDNISONE (PAK) 10 MG PO TABS: 10.0000 mg | ORAL_TABLET | Freq: Every day | ORAL | Status: DC

## 2013-03-21 NOTE — Progress Notes (Signed)
Patient ID: KEYON LILLER, male   DOB: [birth year], 52 y.o.   MRN: [id number] Chief Complaint  Patient presents with  . Follow-up    1 month follow up left knee post op SALK DOS 01/31/13    Complains of continued pain and swelling over the medial aspect of the knee and joint effusion. Activities just includes swimming and driving at work  Medial knee pain over the joint line and over the past tendons flexion is normal straight leg raises good small joint effusion is noted  Recommend prednisone Dosepak 12 days followup 2 weeks continue limited activities swimming should be okay

## 2013-03-27 ENCOUNTER — Encounter: Payer: Self-pay | Admitting: Family Medicine

## 2013-03-27 ENCOUNTER — Ambulatory Visit: Payer: Managed Care, Other (non HMO) | Attending: Family Medicine | Admitting: Sleep Medicine

## 2013-03-27 VITALS — Ht 71.0 in | Wt 234.0 lb

## 2013-03-27 DIAGNOSIS — G4710 Hypersomnia, unspecified: Secondary | ICD-10-CM

## 2013-03-27 DIAGNOSIS — G4730 Sleep apnea, unspecified: Secondary | ICD-10-CM

## 2013-04-06 ENCOUNTER — Ambulatory Visit (INDEPENDENT_AMBULATORY_CARE_PROVIDER_SITE_OTHER): Payer: Managed Care, Other (non HMO) | Admitting: Orthopedic Surgery

## 2013-04-06 DIAGNOSIS — Z9889 Other specified postprocedural states: Secondary | ICD-10-CM

## 2013-04-06 DIAGNOSIS — M6752 Plica syndrome, left knee: Secondary | ICD-10-CM

## 2013-04-06 DIAGNOSIS — M6750 Plica syndrome, unspecified knee: Secondary | ICD-10-CM

## 2013-04-06 DIAGNOSIS — M23322 Other meniscus derangements, posterior horn of medial meniscus, left knee: Secondary | ICD-10-CM

## 2013-04-06 DIAGNOSIS — M23329 Other meniscus derangements, posterior horn of medial meniscus, unspecified knee: Secondary | ICD-10-CM

## 2013-04-06 DIAGNOSIS — S83242D Other tear of medial meniscus, current injury, left knee, subsequent encounter: Secondary | ICD-10-CM

## 2013-04-06 DIAGNOSIS — Z5189 Encounter for other specified aftercare: Secondary | ICD-10-CM

## 2013-04-06 NOTE — Progress Notes (Signed)
Patient ID: Jacob Hanna, male   DOB: Jan 19, 1961, 52 y.o.   MRN: [id number] Chief Complaint  Patient presents with  . Knee Problem    post op knee scope     C/o medial pain and swelling   The patient has had a knee arthroscopy and continues to have pain and swelling has been on a Dosepak ibuprofen once a day is doing a lot of walking  No knee effusion but the synovium his bodies has tenderness over the medial joint line  I gave him a cortisone injection, takes ibuprofen 3 times a day to see if this will help he thinks that the therapy may have reinjured his knee. He started doing some squats of weeks ago and since that time his knee has taken a turn for the worse  Knee  Injection Procedure Note  Pre-operative Diagnosis: left knee oa  Post-operative Diagnosis: same  Indications: pain  Anesthesia: ethyl chloride   Procedure Details   Verbal consent was obtained for the procedure. Time out was completed.The joint was prepped with alcohol, followed by  Ethyl chloride spray and A 20 gauge needle was inserted into the knee via lateral approach; 4ml 1% lidocaine and 1 ml of depomedrol  was then injected into the joint . The needle was removed and the area cleansed and dressed.  Complications:  None; patient tolerated the procedure well.

## 2013-04-06 NOTE — Patient Instructions (Addendum)
Take ibuprofen 3 x a day  You have received a steroid shot. 15% of patients experience increased pain at the injection site with in the next 24 hours. This is best treated with ice and tylenol extra strength 2 tabs every 8 hours. If you are still having pain please call the office.

## 2013-04-10 ENCOUNTER — Ambulatory Visit (HOSPITAL_COMMUNITY)
Admission: RE | Admit: 2013-04-10 | Discharge: 2013-04-10 | Disposition: A | Discharge status: Home / Self Care | Payer: Managed Care, Other (non HMO) | Source: Ambulatory Visit | Attending: Orthopedic Surgery | Admitting: Orthopedic Surgery

## 2013-04-10 ENCOUNTER — Other Ambulatory Visit: Payer: Self-pay | Admitting: Orthopedic Surgery

## 2013-04-10 DIAGNOSIS — M25562 Pain in left knee: Secondary | ICD-10-CM

## 2013-04-10 DIAGNOSIS — M25469 Effusion, unspecified knee: Secondary | ICD-10-CM | POA: Insufficient documentation

## 2013-04-10 DIAGNOSIS — M25569 Pain in unspecified knee: Secondary | ICD-10-CM | POA: Insufficient documentation

## 2013-04-10 DIAGNOSIS — W19XXXA Unspecified fall, initial encounter: Secondary | ICD-10-CM | POA: Insufficient documentation

## 2013-04-10 DIAGNOSIS — S8990XA Unspecified injury of unspecified lower leg, initial encounter: Secondary | ICD-10-CM | POA: Insufficient documentation

## 2013-04-11 ENCOUNTER — Telehealth: Payer: Self-pay | Admitting: Family Medicine

## 2013-04-11 ENCOUNTER — Other Ambulatory Visit: Payer: Self-pay

## 2013-04-11 ENCOUNTER — Encounter: Payer: Self-pay | Admitting: Orthopedic Surgery

## 2013-04-11 ENCOUNTER — Ambulatory Visit (INDEPENDENT_AMBULATORY_CARE_PROVIDER_SITE_OTHER): Payer: Managed Care, Other (non HMO) | Admitting: Orthopedic Surgery

## 2013-04-11 ENCOUNTER — Other Ambulatory Visit: Payer: Self-pay | Admitting: *Deleted

## 2013-04-11 VITALS — BP 140/68 | Ht 71.0 in | Wt 234.0 lb

## 2013-04-11 DIAGNOSIS — Z125 Encounter for screening for malignant neoplasm of prostate: Secondary | ICD-10-CM

## 2013-04-11 DIAGNOSIS — Z79899 Other long term (current) drug therapy: Secondary | ICD-10-CM

## 2013-04-11 DIAGNOSIS — D649 Anemia, unspecified: Secondary | ICD-10-CM

## 2013-04-11 DIAGNOSIS — R7301 Impaired fasting glucose: Secondary | ICD-10-CM

## 2013-04-11 DIAGNOSIS — IMO0002 Reserved for concepts with insufficient information to code with codable children: Secondary | ICD-10-CM

## 2013-04-11 DIAGNOSIS — S83242A Other tear of medial meniscus, current injury, left knee, initial encounter: Secondary | ICD-10-CM

## 2013-04-11 DIAGNOSIS — E785 Hyperlipidemia, unspecified: Secondary | ICD-10-CM

## 2013-04-11 DIAGNOSIS — S83249A Other tear of medial meniscus, current injury, unspecified knee, initial encounter: Secondary | ICD-10-CM | POA: Insufficient documentation

## 2013-04-11 NOTE — Patient Instructions (Addendum)
Re-Injury left knee   Surgery knee arthroscopy June 6

## 2013-04-11 NOTE — Progress Notes (Signed)
Patient ID: Jacob Hanna, male   DOB: 01-May-1961, 52 y.o.   MRN: [id number] Chief Complaint  Patient presents with  . Knee Pain     New injury Left knee d/t stumbling 04/08/13; SALK 01/31/13    The patient had left knee arthroscopy postoperatively and joint effusion subsequent injection and then on May 31 he was at a local movie theater and stumbled on a step reinjured his left knee complains of 8/10 sharp stabbing pain which is increased with weightbearing associated with swelling slightly improved by Tylenol  Review of systems seasonal allergies musculoskeletal joint swelling as described  The other 12 systems are normal  He has no allergies. He does not smoke or drink he is married he takes Tylenol ibuprofen fish oil vitamins iron  BP 140/68  Ht 5\' 11"  (1.803 m)  Wt 234 lb (106.142 kg)  BMI 32.65 kg/m2  General appearance the patient is well-developed and well-nourished grooming and hygiene are normal body habitus medium build   The patient is alert and oriented x3 her mood and affect are normal  Ambulatory status limping/ no crutches  Extremity right knee normal ROM, muscle tone, no effusion, ligaments are stable  Left knee  Inspection joint effusion a lot of tenderness over the medial joint line   Range of motion: PROM = 5-125 The Lachman test is normal the anterior and posterior drawer tests are normal and the collateral ligaments are stable Motor exam normal  Skin normal  McMurray's sign could not perform   Cardiovascular exam normal pulse and perfusion without edema tenderness or varicose veins  sensory exam is normal  xrays 4 views left knee @ the Hospital no fracture   The patient most likely has a re\re tear of his medial meniscus.   He was offered 3 options of MRI, pain medication and bracing or re\re arthroscopy he decided to go with a re\re arthroscopy of the knee with a presumed re tear medial meniscal tear  Summation for clarity: The patient is in the  postop global period from his first meniscectomy on March 25. He now has a new knee injury on May 31. He will now have surgery for that new injury  He had really constant no what we'll get him back to work the quickest and he does not want to wait for the MRI to be approved and followup after that so he has opted to proceed with the surgical option  Procedure arthroscopy left knee and partial medial meniscectomy

## 2013-04-11 NOTE — Telephone Encounter (Signed)
Pt needs BW papers for the PE on 6/19,

## 2013-04-11 NOTE — Telephone Encounter (Signed)
Bloodwork ordered. Left message on answering machine blood papers at front desk ready for pickup.

## 2013-04-12 ENCOUNTER — Encounter (HOSPITAL_COMMUNITY): Payer: Self-pay

## 2013-04-12 ENCOUNTER — Encounter (HOSPITAL_COMMUNITY): Payer: Self-pay | Admitting: Pharmacy Technician

## 2013-04-12 ENCOUNTER — Telehealth: Payer: Self-pay | Admitting: Orthopedic Surgery

## 2013-04-12 ENCOUNTER — Encounter (HOSPITAL_COMMUNITY)
Admission: RE | Admit: 2013-04-12 | Discharge: 2013-04-12 | Disposition: A | Discharge status: Home / Self Care | Payer: Managed Care, Other (non HMO) | Source: Ambulatory Visit | Attending: Orthopedic Surgery | Admitting: Orthopedic Surgery

## 2013-04-12 LAB — CBC WITH DIFFERENTIAL/PLATELET
Eosinophils Absolute: 0.2 10*3/uL (ref 0.0–0.7)
Hemoglobin: 12.1 g/dL — ABNORMAL LOW (ref 13.0–17.0)
Lymphocytes Relative: 39 % (ref 12–46)
Lymphs Abs: 2 10*3/uL (ref 0.7–4.0)
MCH: 28.9 pg (ref 26.0–34.0)
Monocytes Relative: 6 % (ref 3–12)
Neutrophils Relative %: 51 % (ref 43–77)
RBC: 4.19 MIL/uL — ABNORMAL LOW (ref 4.22–5.81)

## 2013-04-12 LAB — BASIC METABOLIC PANEL
BUN: 20 mg/dL (ref 6–23)
BUN: 18 mg/dL (ref 6–23)
CO2: 24 mEq/L (ref 19–32)
Calcium: 9.3 mg/dL (ref 8.4–10.5)
Calcium: 8.7 mg/dL (ref 8.4–10.5)
Chloride: 103 mEq/L (ref 96–112)
Creat: 0.99 mg/dL (ref 0.50–1.35)
Creatinine, Ser: 0.95 mg/dL (ref 0.50–1.35)
Glucose, Bld: 102 mg/dL — ABNORMAL HIGH (ref 70–99)
Glucose, Bld: 98 mg/dL (ref 70–99)
Sodium: 142 mEq/L (ref 135–145)

## 2013-04-12 LAB — LIPID PANEL
Cholesterol: 239 mg/dL — ABNORMAL HIGH (ref 0–200)
HDL: 44 mg/dL (ref >39)
Total CHOL/HDL Ratio: 5.4 Ratio
Triglycerides: 170 mg/dL — ABNORMAL HIGH (ref <150)

## 2013-04-12 LAB — GLUCOSE, RANDOM: Glucose, Bld: 98 mg/dL (ref 70–99)

## 2013-04-12 NOTE — Telephone Encounter (Signed)
[address] insurer, [place], [place] #[phone], regarding out-patient surgery scheduled at [facility] 04/14/13, CPT 29881, 29881, ICD9 836.0.  Per automated voice response system,  no pre-authorization required for either procedure.  Reference confirmation # for code 09811: [id number]; confirmation for code 91478: [phone].  Automated message states:  "no need to speak with a representative."

## 2013-04-12 NOTE — Patient Instructions (Addendum)
Jacob Hanna  04/12/2013   Your procedure is scheduled on:  04/14/13  Report to [facility] at 0900 AM.  Call this number if you have problems the morning of surgery: [phone]   Remember:   Do not eat food or drink liquids after midnight.   Take these medicines the morning of surgery with A SIP OF WATER: pain pill   Do not wear jewelry, make-up or nail polish.  Do not wear lotions, powders, or perfumes. You may wear deodorant.  Do not shave 48 hours prior to surgery. Men may shave face and neck.  Do not bring valuables to the hospital.  [facility] is not responsible                   for any belongings or valuables.  Contacts, dentures or bridgework may not be worn into surgery.  Leave suitcase in the car. After surgery it may be brought to your room.  For patients admitted to the hospital, checkout time is 11:00 AM the day of  discharge.   Patients discharged the day of surgery will not be allowed to drive  home.  Name and phone number of your driver: family  Special Instructions: Shower using CHG 2 nights before surgery and the night before surgery.  If you shower the day of surgery use CHG.  Use special wash - you have one bottle of CHG for all showers.  You should use approximately 1/3 of the bottle for each shower.   Please read over the following fact sheets that you were given: Pain Booklet, Coughing and Deep Breathing, MRSA Information, Surgical Site Infection Prevention, Anesthesia Post-op Instructions and Care and Recovery After Surgery   PATIENT INSTRUCTIONS POST-ANESTHESIA  IMMEDIATELY FOLLOWING SURGERY:  Do not drive or operate machinery for the first twenty four hours after surgery.  Do not make any important decisions for twenty four hours after surgery or while taking narcotic pain medications or sedatives.  If you develop intractable nausea and vomiting or a severe headache please notify your doctor immediately.  FOLLOW-UP:  Please make an appointment with your  surgeon as instructed. You do not need to follow up with anesthesia unless specifically instructed to do so.  WOUND CARE INSTRUCTIONS (if applicable):  Keep a dry clean dressing on the anesthesia/puncture wound site if there is drainage.  Once the wound has quit draining you may leave it open to air.  Generally you should leave the bandage intact for twenty four hours unless there is drainage.  If the epidural site drains for more than 36-48 hours please call the anesthesia department.  QUESTIONS?:  Please feel free to call your physician or the hospital operator if you have any questions, and they will be happy to assist you.      Arthroscopic Procedure, Knee An arthroscopic procedure can find what is wrong with your knee. PROCEDURE Arthroscopy is a surgical technique that allows your orthopedic surgeon to diagnose and treat your knee injury with accuracy. They will look into your knee through a small instrument. This is almost like a small (pencil sized) telescope. Because arthroscopy affects your knee less than open knee surgery, you can anticipate a more rapid recovery. Taking an active role by following your caregiver's instructions will help with rapid and complete recovery. Use crutches, rest, elevation, ice, and knee exercises as instructed. The length of recovery depends on various factors including type of injury, age, physical condition, medical conditions, and your rehabilitation. Your knee is  the joint between the large bones (femur and tibia) in your leg. Cartilage covers these bone ends which are smooth and slippery and allow your knee to bend and move smoothly. Two menisci, thick, semi-lunar shaped pads of cartilage which form a rim inside the joint, help absorb shock and stabilize your knee. Ligaments bind the bones together and support your knee joint. Muscles move the joint, help support your knee, and take stress off the joint itself. Because of this all programs and physical therapy  to rehabilitate an injured or repaired knee require rebuilding and strengthening your muscles. AFTER THE PROCEDURE  After the procedure, you will be moved to a recovery area until most of the effects of the medication have worn off. Your caregiver will discuss the test results with you.  Only take over-the-counter or prescription medicines for pain, discomfort, or fever as directed by your caregiver. SEEK MEDICAL CARE IF:   You have increased bleeding from your wounds.  You see redness, swelling, or have increasing pain in your wounds.  You have pus coming from your wound.  You have an oral temperature above 102 F (38.9 C).  You notice a bad smell coming from the wound or dressing.  You have severe pain with any motion of your knee. SEEK IMMEDIATE MEDICAL CARE IF:   You develop a rash.  You have difficulty breathing.  You have any allergic problems. Document Released: 10/23/2000 Document Revised: 01/18/2012 Document Reviewed: 05/16/2008 [facility] Patient Information 2014 [place], [place].

## 2013-04-13 LAB — PSA: PSA: 1.31 ng/mL (ref <=4.00)

## 2013-04-13 NOTE — H&P (Signed)
Patient ID: Jacob Hanna, male DOB: 1960-12-06, 52 y.o. MRN: [id number]  Chief Complaint   Patient presents with   .  Knee Pain     New injury Left knee d/t stumbling 04/08/13; SALK 01/31/13   The patient had left knee arthroscopy postoperatively and joint effusion subsequent injection and then on May 31 he was at a local movie theater and stumbled on a step reinjured his left knee complains of 8/10 sharp stabbing pain which is increased with weightbearing associated with swelling slightly improved by Tylenol  Review of systems seasonal allergies musculoskeletal joint swelling as described  The other 12 systems are normal  History  Substance Use Topics  . Smoking status: Never Smoker   . Smokeless tobacco: Not on file  . Alcohol Use: No    Past Medical History  Diagnosis Date  . Anemia    Past Surgical History  Procedure Laterality Date  . Givens capsule study  09/07/2012    Procedure: GIVENS CAPSULE STUDY;  Surgeon: Malissa Hippo, MD;  Location: [facility];  Service: Endoscopy;  Laterality: N/A;  730  . Colonoscopy    . Esophagogastroduodenoscopy    . Knee arthroscopy with medial menisectomy Left 01/31/2013    Procedure: KNEE ARTHROSCOPY WITH PARTIAL  MEDIAL MENISECTOMY;  Surgeon: Vickki Hearing, MD;  Location: [facility] ORS;  Service: Orthopedics;  Laterality: Left;  . Knee arthroscopy with excision plica Left 01/31/2013    Procedure: KNEE ARTHROSCOPY WITH EXCISION PLICA;  Surgeon: Vickki Hearing, MD;  Location: [facility] ORS;  Service: Orthopedics;  Laterality: Left;  . Knee surgery     No family history on file.  He has no allergies. He does not smoke or drink he is married he takes Tylenol ibuprofen fish oil vitamins iron  BP 140/68  Ht 5\' 11"  (1.803 m)  Wt 234 lb (106.142 kg)  BMI 32.65 kg/m2  General appearance the patient is well-developed and well-nourished grooming and hygiene are normal body habitus medium build  The patient is alert and oriented x3 her mood and  affect are normal  Ambulatory status limping/ no crutches  Extremity right knee normal ROM, muscle tone, no effusion, ligaments are stable  Left knee  Inspection joint effusion a lot of tenderness over the medial joint line  Range of motion: PROM = 5-125  The Lachman test is normal the anterior and posterior drawer tests are normal and the collateral ligaments are stable  Motor exam normal  Skin normal  McMurray's sign could not perform  Cardiovascular exam normal pulse and perfusion without edema tenderness or varicose veins  sensory exam is normal  xrays 4 views left knee @ the Hospital no fracture  The patient most likely has a re\re tear of his medial meniscus.  He was offered 3 options of MRI, pain medication and bracing or re\re arthroscopy he decided to go with a re\re arthroscopy of the knee with a presumed re tear medial meniscal tear  Summation for clarity: The patient is in the postop global period from his first meniscectomy on March 25. He now has a new knee injury on May 31. He will now have surgery for that new injury  He had really constant no what we'll get him back to work the quickest and he does not want to wait for the MRI to be approved and followup after that so he has opted to proceed with the surgical option  Procedure arthroscopy left knee and partial medial meniscectomy

## 2013-04-14 ENCOUNTER — Encounter (HOSPITAL_COMMUNITY)
Admission: RE | Disposition: A | Discharge status: Home / Self Care | Payer: Self-pay | Source: Ambulatory Visit | Attending: Orthopedic Surgery

## 2013-04-14 ENCOUNTER — Ambulatory Visit (HOSPITAL_COMMUNITY)
Admission: RE | Admit: 2013-04-14 | Discharge: 2013-04-14 | Disposition: A | Discharge status: Home / Self Care | Payer: Managed Care, Other (non HMO) | Source: Ambulatory Visit | Attending: Orthopedic Surgery | Admitting: Orthopedic Surgery

## 2013-04-14 ENCOUNTER — Encounter (HOSPITAL_COMMUNITY): Payer: Self-pay | Admitting: Anesthesiology

## 2013-04-14 ENCOUNTER — Ambulatory Visit (HOSPITAL_COMMUNITY): Payer: Managed Care, Other (non HMO) | Admitting: Anesthesiology

## 2013-04-14 ENCOUNTER — Encounter (HOSPITAL_COMMUNITY): Payer: Self-pay | Admitting: *Deleted

## 2013-04-14 DIAGNOSIS — W010XXA Fall on same level from slipping, tripping and stumbling without subsequent striking against object, initial encounter: Secondary | ICD-10-CM | POA: Insufficient documentation

## 2013-04-14 DIAGNOSIS — M23329 Other meniscus derangements, posterior horn of medial meniscus, unspecified knee: Secondary | ICD-10-CM

## 2013-04-14 DIAGNOSIS — M23322 Other meniscus derangements, posterior horn of medial meniscus, left knee: Secondary | ICD-10-CM

## 2013-04-14 DIAGNOSIS — S83242D Other tear of medial meniscus, current injury, left knee, subsequent encounter: Secondary | ICD-10-CM

## 2013-04-14 DIAGNOSIS — IMO0002 Reserved for concepts with insufficient information to code with codable children: Secondary | ICD-10-CM

## 2013-04-14 DIAGNOSIS — Z5189 Encounter for other specified aftercare: Secondary | ICD-10-CM

## 2013-04-14 DIAGNOSIS — S83242S Other tear of medial meniscus, current injury, left knee, sequela: Secondary | ICD-10-CM

## 2013-04-14 DIAGNOSIS — Y9229 Other specified public building as the place of occurrence of the external cause: Secondary | ICD-10-CM | POA: Insufficient documentation

## 2013-04-14 DIAGNOSIS — M1710 Unilateral primary osteoarthritis, unspecified knee: Secondary | ICD-10-CM | POA: Insufficient documentation

## 2013-04-14 HISTORY — PX: CHONDROPLASTY: SHX5177

## 2013-04-14 HISTORY — PX: KNEE ARTHROSCOPY WITH MEDIAL MENISECTOMY: SHX5651

## 2013-04-14 SURGERY — ARTHROSCOPY, KNEE, WITH MEDIAL MENISCECTOMY
Anesthesia: General | Site: Knee | Laterality: Left | Wound class: Clean

## 2013-04-14 MED ORDER — HYDROCODONE-ACETAMINOPHEN 5-325 MG PO TABS
1.0000 | ORAL_TABLET | Freq: Once | ORAL | Status: AC
  Administered 2013-04-14: 1 via ORAL

## 2013-04-14 MED ORDER — ONDANSETRON HCL 4 MG/2ML IJ SOLN
INTRAMUSCULAR | Status: AC
  Filled 2013-04-14: qty 2

## 2013-04-14 MED ORDER — FENTANYL CITRATE 0.05 MG/ML IJ SOLN
INTRAMUSCULAR | Status: DC | PRN
  Administered 2013-04-14: 25 ug via INTRAVENOUS
  Administered 2013-04-14: 75 ug via INTRAVENOUS
  Administered 2013-04-14: 100 ug via INTRAVENOUS

## 2013-04-14 MED ORDER — SODIUM CHLORIDE 0.9 % IR SOLN
Status: DC | PRN
  Administered 2013-04-14: 1000 mL

## 2013-04-14 MED ORDER — ONDANSETRON HCL 4 MG/2ML IJ SOLN: 4.0000 mg | Freq: Once | INTRAMUSCULAR | Status: DC | PRN

## 2013-04-14 MED ORDER — BUPIVACAINE-EPINEPHRINE PF 0.5-1:200000 % IJ SOLN
INTRAMUSCULAR | Status: DC | PRN
  Administered 2013-04-14: 60 mL

## 2013-04-14 MED ORDER — ONDANSETRON HCL 4 MG/2ML IJ SOLN
4.0000 mg | Freq: Once | INTRAMUSCULAR | Status: AC
  Administered 2013-04-14: 4 mg via INTRAVENOUS

## 2013-04-14 MED ORDER — LACTATED RINGERS IV SOLN
INTRAVENOUS | Status: DC
  Administered 2013-04-14: 1000 mL via INTRAVENOUS

## 2013-04-14 MED ORDER — FENTANYL CITRATE 0.05 MG/ML IJ SOLN
INTRAMUSCULAR | Status: AC
  Filled 2013-04-14: qty 4

## 2013-04-14 MED ORDER — MIDAZOLAM HCL 5 MG/5ML IJ SOLN
INTRAMUSCULAR | Status: DC | PRN
  Administered 2013-04-14: 2 mg via INTRAVENOUS

## 2013-04-14 MED ORDER — IBUPROFEN 800 MG PO TABS: 800.0000 mg | ORAL_TABLET | Freq: Three times a day (TID) | ORAL | Status: DC | PRN

## 2013-04-14 MED ORDER — LIDOCAINE HCL 1 % IJ SOLN
INTRAMUSCULAR | Status: DC | PRN
  Administered 2013-04-14: 50 mg via INTRADERMAL

## 2013-04-14 MED ORDER — CEFAZOLIN SODIUM-DEXTROSE 2-3 GM-% IV SOLR
2.0000 g | INTRAVENOUS | Status: AC
  Administered 2013-04-14: 2 g via INTRAVENOUS

## 2013-04-14 MED ORDER — PROPOFOL 10 MG/ML IV BOLUS
INTRAVENOUS | Status: DC | PRN
  Administered 2013-04-14: 170 mg via INTRAVENOUS

## 2013-04-14 MED ORDER — PROMETHAZINE HCL 12.5 MG PO TABS: 12.5000 mg | ORAL_TABLET | Freq: Four times a day (QID) | ORAL | Status: DC | PRN

## 2013-04-14 MED ORDER — MIDAZOLAM HCL 2 MG/2ML IJ SOLN
INTRAMUSCULAR | Status: AC
  Filled 2013-04-14: qty 2

## 2013-04-14 MED ORDER — BUPIVACAINE-EPINEPHRINE PF 0.5-1:200000 % IJ SOLN
INTRAMUSCULAR | Status: AC
  Filled 2013-04-14: qty 20

## 2013-04-14 MED ORDER — KETOROLAC TROMETHAMINE 30 MG/ML IJ SOLN
30.0000 mg | Freq: Once | INTRAMUSCULAR | Status: AC
  Administered 2013-04-14: 30 mg via INTRAVENOUS

## 2013-04-14 MED ORDER — CELECOXIB 100 MG PO CAPS
400.0000 mg | ORAL_CAPSULE | Freq: Once | ORAL | Status: AC
  Administered 2013-04-14: 400 mg via ORAL

## 2013-04-14 MED ORDER — SODIUM CHLORIDE 0.9 % IR SOLN
Status: DC | PRN
  Administered 2013-04-14 (×4)

## 2013-04-14 MED ORDER — FENTANYL CITRATE 0.05 MG/ML IJ SOLN: 25.0000 ug | INTRAMUSCULAR | Status: DC | PRN

## 2013-04-14 MED ORDER — EPINEPHRINE HCL 1 MG/ML IJ SOLN
INTRAMUSCULAR | Status: AC
  Filled 2013-04-14: qty 5

## 2013-04-14 MED ORDER — FENTANYL CITRATE 0.05 MG/ML IJ SOLN
25.0000 ug | INTRAMUSCULAR | Status: DC | PRN
  Administered 2013-04-14: 25 ug via INTRAVENOUS

## 2013-04-14 MED ORDER — MIDAZOLAM HCL 2 MG/2ML IJ SOLN
1.0000 mg | INTRAMUSCULAR | Status: DC | PRN
  Administered 2013-04-14: 2 mg via INTRAVENOUS

## 2013-04-14 MED ORDER — CEFAZOLIN SODIUM-DEXTROSE 2-3 GM-% IV SOLR
INTRAVENOUS | Status: AC
  Filled 2013-04-14: qty 50

## 2013-04-14 MED ORDER — FENTANYL CITRATE 0.05 MG/ML IJ SOLN
INTRAMUSCULAR | Status: AC
  Filled 2013-04-14: qty 2

## 2013-04-14 MED ORDER — CHLORHEXIDINE GLUCONATE 4 % EX LIQD: 60.0000 mL | Freq: Once | CUTANEOUS | Status: DC

## 2013-04-14 MED ORDER — OXYCODONE-ACETAMINOPHEN 7.5-325 MG PO TABS: 1.0000 | ORAL_TABLET | ORAL | Status: DC | PRN

## 2013-04-14 SURGICAL SUPPLY — 47 items
BAG HAMPER (MISCELLANEOUS) ×2 IMPLANT
BANDAGE ELASTIC 6 VELCRO NS (GAUZE/BANDAGES/DRESSINGS) ×2 IMPLANT
BLADE AGGRESSIVE PLUS 4.0 (BLADE) ×2 IMPLANT
BLADE SURG SZ11 CARB STEEL (BLADE) ×2 IMPLANT
BNDG COHESIVE 4X5 TAN STRL (GAUZE/BANDAGES/DRESSINGS) ×2 IMPLANT
CHLORAPREP W/TINT 26ML (MISCELLANEOUS) ×4 IMPLANT
CLOTH BEACON ORANGE TIMEOUT ST (SAFETY) ×2 IMPLANT
CUFF TOURNIQUET SINGLE 34IN LL (TOURNIQUET CUFF) ×2 IMPLANT
CUTTER ANGLED DBL BITE 4.5 (BURR) IMPLANT
DECANTER SPIKE VIAL GLASS SM (MISCELLANEOUS) ×4 IMPLANT
GAUZE XEROFORM 5X9 LF (GAUZE/BANDAGES/DRESSINGS) ×2 IMPLANT
GLOVE BIOGEL PI IND STRL 7.0 (GLOVE) ×1 IMPLANT
GLOVE BIOGEL PI IND STRL 8 (GLOVE) ×1 IMPLANT
GLOVE BIOGEL PI INDICATOR 7.0 (GLOVE) ×1
GLOVE BIOGEL PI INDICATOR 8 (GLOVE) ×1
GLOVE EXAM NITRILE LRG STRL (GLOVE) ×2 IMPLANT
GLOVE SKINSENSE NS SZ8.0 LF (GLOVE) ×1
GLOVE SKINSENSE STRL SZ8.0 LF (GLOVE) ×1 IMPLANT
GLOVE SS BIOGEL STRL SZ 6.5 (GLOVE) ×1 IMPLANT
GLOVE SS N UNI LF 8.5 STRL (GLOVE) ×2 IMPLANT
GLOVE SUPERSENSE BIOGEL SZ 6.5 (GLOVE) ×1
GOWN STRL REIN XL XLG (GOWN DISPOSABLE) ×4 IMPLANT
HLDR LEG FOAM (MISCELLANEOUS) ×1 IMPLANT
IMMOBILIZER KNEE 19 UNV (ORTHOPEDIC SUPPLIES) ×2 IMPLANT
IV NS IRRIG 3000ML ARTHROMATIC (IV SOLUTION) ×8 IMPLANT
KIT BLADEGUARD II DBL (SET/KITS/TRAYS/PACK) ×2 IMPLANT
KIT ROOM TURNOVER AP CYSTO (KITS) ×2 IMPLANT
LEG HOLDER FOAM (MISCELLANEOUS) ×1
MANIFOLD NEPTUNE II (INSTRUMENTS) ×2 IMPLANT
MARKER SKIN DUAL TIP RULER LAB (MISCELLANEOUS) ×2 IMPLANT
NEEDLE HYPO 18GX1.5 BLUNT FILL (NEEDLE) ×2 IMPLANT
NEEDLE HYPO 21X1.5 SAFETY (NEEDLE) ×2 IMPLANT
NEEDLE SPNL 18GX3.5 QUINCKE PK (NEEDLE) ×2 IMPLANT
NS IRRIG 1000ML POUR BTL (IV SOLUTION) ×2 IMPLANT
PACK ARTHRO LIMB DRAPE STRL (MISCELLANEOUS) ×2 IMPLANT
PAD ABD 5X9 TENDERSORB (GAUZE/BANDAGES/DRESSINGS) ×2 IMPLANT
PAD ARMBOARD 7.5X6 YLW CONV (MISCELLANEOUS) ×2 IMPLANT
PADDING CAST COTTON 6X4 STRL (CAST SUPPLIES) ×2 IMPLANT
SET ARTHROSCOPY INST (INSTRUMENTS) ×2 IMPLANT
SET ARTHROSCOPY PUMP TUBE (IRRIGATION / IRRIGATOR) ×2 IMPLANT
SET BASIN LINEN APH (SET/KITS/TRAYS/PACK) ×2 IMPLANT
SPONGE GAUZE 4X4 12PLY (GAUZE/BANDAGES/DRESSINGS) ×2 IMPLANT
SUT ETHILON 3 0 FSL (SUTURE) ×2 IMPLANT
SYR 30ML LL (SYRINGE) ×2 IMPLANT
SYRINGE 10CC LL (SYRINGE) ×2 IMPLANT
WAND 50 DEG COVAC W/CORD (SURGICAL WAND) ×2 IMPLANT
YANKAUER SUCT BULB TIP 10FT TU (MISCELLANEOUS) ×6 IMPLANT

## 2013-04-14 NOTE — Interval H&P Note (Signed)
History and Physical Interval Note:  04/14/2013 11:08 AM  Jacob Hanna  has presented today for surgery, with the diagnosis of medial meniscal tear left knee  The various methods of treatment have been discussed with the patient and family. After consideration of risks, benefits and other options for treatment, the patient has consented to  Procedure(s) with comments: KNEE ARTHROSCOPY WITH PARTIAL MEDIAL MENISECTOMY (Left) - Partial medial menisectomy as a surgical intervention .  The patient's history has been reviewed, patient examined, no change in status, stable for surgery.  I have reviewed the patient's chart and labs.  Questions were answered to the patient's satisfaction.     [name]

## 2013-04-14 NOTE — Anesthesia Postprocedure Evaluation (Signed)
  Anesthesia Post-op Note  Patient: Jacob Hanna  Procedure(s) Performed: Procedure(s) with comments: KNEE ARTHROSCOPY WITH PARTIAL MEDIAL MENISECTOMY (Left) - Partial medial menisectomy CHONDROPLASTY (Left)  Patient Location: PACU  Anesthesia Type:General  Level of Consciousness: awake, alert , oriented and patient cooperative  Airway and Oxygen Therapy: Patient Spontanous Breathing  Post-op Pain: mild  Post-op Assessment: Post-op Vital signs reviewed, Patient's Cardiovascular Status Stable, Respiratory Function Stable, Patent Airway, No signs of Nausea or vomiting and Pain level controlled  Post-op Vital Signs: Reviewed and stable  Complications: No apparent anesthesia complications

## 2013-04-14 NOTE — Anesthesia Procedure Notes (Signed)
Procedure Name: LMA Insertion Date/Time: 04/14/2013 11:35 AM Performed by: Despina Hidden Pre-anesthesia Checklist: Patient identified, Emergency Drugs available, Suction available and Patient being monitored Patient Re-evaluated:Patient Re-evaluated prior to inductionOxygen Delivery Method: Circle system utilized Preoxygenation: Pre-oxygenation with 100% oxygen Intubation Type: IV induction Ventilation: Mask ventilation without difficulty LMA: LMA inserted LMA Size: 4.0 Tube type: Oral Number of attempts: 1 Placement Confirmation: positive ETCO2 and breath sounds checked- equal and bilateral Tube secured with: Tape Dental Injury: Teeth and Oropharynx as per pre-operative assessment

## 2013-04-14 NOTE — Transfer of Care (Signed)
Immediate Anesthesia Transfer of Care Note  Patient: Jacob Hanna  Procedure(s) Performed: Procedure(s) with comments: KNEE ARTHROSCOPY WITH PARTIAL MEDIAL MENISECTOMY (Left) - Partial medial menisectomy CHONDROPLASTY (Left)  Patient Location: PACU  Anesthesia Type:General  Level of Consciousness: awake and patient cooperative  Airway & Oxygen Therapy: Patient Spontanous Breathing and Patient connected to face mask oxygen  Post-op Assessment: Report given to PACU RN, Post -op Vital signs reviewed and stable and Patient moving all extremities  Post vital signs: Reviewed and stable  Complications: No apparent anesthesia complications

## 2013-04-14 NOTE — Op Note (Signed)
04/14/2013  12:33 PM  PATIENT:  Jacob Hanna  52 y.o. 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 Previous plica excision looked very good. Lateral compartment was normal. Cruciate ligaments were normal. Were normal. Repair the medial meniscus this time that the body of the meniscus.  Previous meniscectomy normal.  Procedure details: The patient was first identified in the preoperative holding area. The left knee was marked as the surgical site by the patient and surgeon and confirmed. Chart review was completed. Patient was taken to the operating room for general anesthesia and appropriate dose of Ancef per protocol based on his weight.  His left knee was placed in arthroscopic leg holder. A padded legholder was used to place the right leg.  Sterile prep and drape was performed.  Time out completed  The diagnostic portion of the arthroscopy was started but placing the arthroscope through the lateral portal into the medial portal. We started the viewing in the suprapatellar pouch reviewed the patellofemoral joint lateral gutter lateral femoral condyle anterior cruciate ligament lateral meniscus lateral articular surface medial condyle and medial meniscus.  A retear of the medial meniscus was noted at the junction of the body and posterior horn. The previous meniscectomy in the posterior horn had a stable rim.  A chondroplasty was performed on the degenerative medial femoral condyle.  We looked all the structures of the knee again. I placed a probe in the joint and repalpated everything to make sure everything else was normal and it was  We then irrigated the knee closed with 3-0 nylon suture. We injected 60 cc of Marcaine with epinephrine 0.5%  Sterile dressings were applied along with a Cryo/Cuff  which was activated.  The patient was extubated and taken to the recovery room in stable condition  Postoperative plan the patient will be weightbearing as tolerated in a knee immobilizer. PROCEDURE:  Procedure(s) with comments: KNEE ARTHROSCOPY WITH PARTIAL MEDIAL MENISECTOMY (Left) - Partial medial menisectomy CHONDROPLASTY (Left)  SURGEON:  Surgeon(s) and Role:    * Vickki Hearing, MD - Primary  PHYSICIAN ASSISTANT:   ASSISTANTS: none   ANESTHESIA:   general  EBL:  Total I/O In: 700 [I.V.:700] Out: -   BLOOD ADMINISTERED:none  DRAINS: none   LOCAL MEDICATIONS USED:  MARCAINE  0.5% with epi  and Amount: 60 ml  SPECIMEN:  No Specimen  DISPOSITION OF SPECIMEN:  N/A  COUNTS:  YES  TOURNIQUET:    DICTATION: .Dragon Dictation  PLAN OF CARE: Discharge to home after PACU  PATIENT DISPOSITION:  PACU - hemodynamically stable.   Delay start of Pharmacological VTE agent (>24hrs) due to surgical blood loss or risk of bleeding: not applicable

## 2013-04-14 NOTE — Brief Op Note (Addendum)
04/14/2013  12:33 PM  PATIENT:  Jacob Hanna  51 y.o. 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 Previous plica excision looked very good. Lateral compartment was normal. Cruciate ligaments were normal. Were normal. Repair the medial meniscus this time that the body of the meniscus.  Previous meniscectomy normal.  Procedure details: The patient was first identified in the preoperative holding area. The left knee was marked as the surgical site by the patient and surgeon and confirmed. Chart review was completed. Patient was taken to the operating room for general anesthesia and appropriate dose of Ancef per protocol based on his weight.  His left knee was placed in arthroscopic leg holder. A padded legholder was used to place the right leg.  Sterile prep and drape was performed.  Time out completed  The diagnostic portion of the arthroscopy was started but placing the arthroscope through the lateral portal into the medial portal. We started the viewing in the suprapatellar pouch reviewed the patellofemoral joint lateral gutter lateral femoral condyle anterior cruciate ligament lateral meniscus lateral articular surface medial condyle and medial meniscus.  A retear of the medial meniscus was noted at the junction of the body and posterior horn. The previous meniscectomy in the posterior horn had a stable rim.  A chondroplasty was performed on the degenerative medial femoral condyle.  We looked all the structures of the knee again. I placed a probe in the joint and repalpated everything to make sure everything else was normal and it was  We then irrigated the knee closed with 3-0 nylon suture. We injected 60 cc of Marcaine with epinephrine 0.5%  Sterile dressings were applied along with a Cryo/Cuff  which was activated.  The patient was extubated and taken to the recovery room in stable condition  Postoperative plan the patient will be weightbearing as tolerated in a knee immobilizer. PROCEDURE:  Procedure(s) with comments: KNEE ARTHROSCOPY WITH PARTIAL MEDIAL MENISECTOMY (Left) - Partial medial menisectomy CHONDROPLASTY (Left)  SURGEON:  Surgeon(s) and Role:    * Anicia Leuthold E Yarisa Lynam, MD - Primary  PHYSICIAN ASSISTANT:   ASSISTANTS: none   ANESTHESIA:   general  EBL:  Total I/O In: 700 [I.V.:700] Out: -   BLOOD ADMINISTERED:none  DRAINS: none   LOCAL MEDICATIONS USED:  MARCAINE  0.5% with epi  and Amount: 60 ml  SPECIMEN:  No Specimen  DISPOSITION OF SPECIMEN:  N/A  COUNTS:  YES  TOURNIQUET:    DICTATION: .Dragon Dictation  PLAN OF CARE: Discharge to home after PACU  PATIENT DISPOSITION:  PACU - hemodynamically stable.   Delay start of Pharmacological VTE agent (>24hrs) due to surgical blood loss or risk of bleeding: not applicable 

## 2013-04-14 NOTE — Anesthesia Preprocedure Evaluation (Signed)
Anesthesia Evaluation  Patient identified by MRN, date of birth, ID band Patient awake    Reviewed: Allergy & Precautions, H&P , NPO status , Patient's Chart, lab work & pertinent test results  Airway Mallampati: I TM Distance: >3 FB     Dental  (+) Teeth Intact   Pulmonary neg pulmonary ROS,  breath sounds clear to auscultation        Cardiovascular negative cardio ROS  Rhythm:Regular Rate:Normal     Neuro/Psych    GI/Hepatic   Endo/Other    Renal/GU      Musculoskeletal   Abdominal   Peds  Hematology  (+) Blood dyscrasia, anemia ,   Anesthesia Other Findings   Reproductive/Obstetrics                           Anesthesia Physical Anesthesia Plan  ASA: II  Anesthesia Plan: General   Post-op Pain Management:    Induction: Intravenous  Airway Management Planned: LMA  Additional Equipment:   Intra-op Plan:   Post-operative Plan: Extubation in OR  Informed Consent: I have reviewed the patients History and Physical, chart, labs and discussed the procedure including the risks, benefits and alternatives for the proposed anesthesia with the patient or authorized representative who has indicated his/her understanding and acceptance.     Plan Discussed with:   Anesthesia Plan Comments:         Anesthesia Quick Evaluation

## 2013-04-17 ENCOUNTER — Encounter (HOSPITAL_COMMUNITY): Payer: Self-pay | Admitting: Orthopedic Surgery

## 2013-04-17 ENCOUNTER — Ambulatory Visit (INDEPENDENT_AMBULATORY_CARE_PROVIDER_SITE_OTHER): Payer: Managed Care, Other (non HMO) | Admitting: Orthopedic Surgery

## 2013-04-17 VITALS — BP 120/82 | Ht 71.0 in | Wt 234.0 lb

## 2013-04-17 DIAGNOSIS — S83242D Other tear of medial meniscus, current injury, left knee, subsequent encounter: Secondary | ICD-10-CM

## 2013-04-17 DIAGNOSIS — Z5189 Encounter for other specified aftercare: Secondary | ICD-10-CM

## 2013-04-17 NOTE — Patient Instructions (Signed)
START THERAPY IN A WEEK

## 2013-04-17 NOTE — Progress Notes (Signed)
Patient ID: Jacob Hanna, male   DOB: 08-15-1961, 52 y.o.   MRN: [id number] Chief Complaint  Patient presents with  . Follow-up    Post op 1 SALK DOS 04/14/13   Postop visit   S/P knee arthroscopy   DX MEDIAL MENISCUS   Procedure SARK. MED MENISECTOMY   Operative Findings torn medial meniscus in front of his previous tear with chondral changes consistent with osteoarthritis of the medial femoral condyle and a brief chondroplasty  Complaints he notes his knee is much improved using almost no pain requiring only ibuprofen once or twice a day. He is compliant with his restrictions he started his knee flexion exercises and has 90 of knee flexion the portals are clean  Plan continue current regimen start therapy in a week return in 3 weeks

## 2013-04-24 ENCOUNTER — Ambulatory Visit (HOSPITAL_COMMUNITY)
Admission: RE | Admit: 2013-04-24 | Discharge: 2013-04-24 | Disposition: A | Discharge status: Home / Self Care | Payer: Managed Care, Other (non HMO) | Source: Ambulatory Visit | Attending: Family Medicine | Admitting: Family Medicine

## 2013-04-24 DIAGNOSIS — M6281 Muscle weakness (generalized): Secondary | ICD-10-CM | POA: Insufficient documentation

## 2013-04-24 DIAGNOSIS — M25569 Pain in unspecified knee: Secondary | ICD-10-CM | POA: Insufficient documentation

## 2013-04-24 DIAGNOSIS — IMO0001 Reserved for inherently not codable concepts without codable children: Secondary | ICD-10-CM | POA: Insufficient documentation

## 2013-04-24 NOTE — Evaluation (Signed)
Physical Therapy Evaluation  Patient Details  Name: Jacob Hanna MRN: [id number] Date of Birth: June 26, 1961  Today's Date: 04/24/2013 Time: 2956-2130 PT Time Calculation (min): 35 min Charges: 1 evaluation TE: [id number]             Visit#: 1 of 3  Re-eval: 05/24/13 Assessment Diagnosis: L knee arthroscopic surgery Surgical Date: 04/14/13 Next MD Visit: Dr. Romeo Apple  Authorization: [employer]    Authorization Time Period:    Authorization Visit#:   of     Past Medical History:  Past Medical History  Diagnosis Date  . Anemia    Past Surgical History:  Past Surgical History  Procedure Laterality Date  . Givens capsule study  09/07/2012    Procedure: GIVENS CAPSULE STUDY;  Surgeon: Malissa Hippo, MD;  Location: [facility];  Service: Endoscopy;  Laterality: N/A;  730  . Colonoscopy    . Esophagogastroduodenoscopy    . Knee arthroscopy with medial menisectomy Left 01/31/2013    Procedure: KNEE ARTHROSCOPY WITH PARTIAL  MEDIAL MENISECTOMY;  Surgeon: Vickki Hearing, MD;  Location: [facility];  Service: Orthopedics;  Laterality: Left;  . Knee arthroscopy with excision plica Left 01/31/2013    Procedure: KNEE ARTHROSCOPY WITH EXCISION PLICA;  Surgeon: Vickki Hearing, MD;  Location: [facility];  Service: Orthopedics;  Laterality: Left;  . Knee surgery    . Knee arthroscopy with medial menisectomy Left 04/14/2013    Procedure: KNEE ARTHROSCOPY WITH PARTIAL MEDIAL MENISECTOMY;  Surgeon: Vickki Hearing, MD;  Location: [facility];  Service: Orthopedics;  Laterality: Left;  Partial medial menisectomy  . Chondroplasty Left 04/14/2013    Procedure: CHONDROPLASTY;  Surgeon: Vickki Hearing, MD;  Location: [facility];  Service: Orthopedics;  Laterality: Left;    Subjective Symptoms/Limitations Symptoms: Pt is referred back to PT after his second lt knee scope to remove medial meniscus on 04/14/13.  He was attending therapy for inital knee scope and went out to the movies and stumbled on a step at  the movies.  His c/co is pain and tenderness overal medial portal, pain to posterior knee, difficulty going up and down steps.  He is using his cryocuff 3-4x/day for at least 20 minutes.  Pain Assessment Currently in Pain?: Yes Pain Score:   6 Pain Location: Knee Pain Orientation: Left Pain Type: Acute pain;Surgical pain Pain Onset: 1 to 4 weeks ago Pain Frequency: Intermittent Pain Relieving Factors: pain medication, ice Effect of Pain on Daily Activities: difficulty walking  Assessment LLE AROM (degrees) Left Knee Extension: 7 Left Knee Flexion: 105 LLE Strength Left Hip Flexion: 5/5 Left Hip Extension: 5/5 Left Hip ABduction: 5/5 Left Hip ADduction: 5/5 Left Knee Flexion: 5/5 Left Knee Extension: 5/5 Left Ankle Dorsiflexion: 5/5 Palpation Palpation: pain and tenderness to medial portal, hamstrings and popliteal fossa  Mobility/Balance  Ambulation/Gait Ambulation/Gait: Yes Gait Pattern: Antalgic;Decreased hip/knee flexion - left;Left foot flat   Exercise/Treatments Stretches   Aerobic   Machines for Strengthening   Plyometrics   Standing Functional Squat: 10 reps Seated   Supine Quad Sets: Left;10 reps Short Arc Quad Sets: Left;10 reps Straight Leg Raises: Left;10 reps Sidelying   Prone  Hamstring Curl: 10 reps Hip Extension: 10 reps      Physical Therapy Assessment and Plan PT Assessment and Plan Clinical Impression Statement: Pt is a 52 year old male referred back to PT after his second knee scope to remove more medial meniscus with impairments listed below.  At this time has been ordered to  perform only specific exercises.  At this time pt is independent with HEP.  Will continue to follow pt for 1 month if needed to address pain and AROM.   Pt will benefit from skilled therapeutic intervention in order to improve on the following deficits: Abnormal gait;Pain;Decreased range of motion;Impaired flexibility;Increased fascial restricitons Rehab  Potential: Good PT Frequency: Min 1X/week PT Duration: 4 weeks PT Treatment/Interventions: Gait training;Stair training;Functional mobility training;Therapeutic activities;Therapeutic exercise;Modalities;Manual techniques PT Plan: MD ordered: quad sets, 4 way SLR, SAQ, knee flexion, mini squats NO WALL SQUATS    Goals Home Exercise Program Pt will Perform Home Exercise Program: Independently PT Goal: Perform Home Exercise Program - Progress: Goal set today PT Short Term Goals Time to Complete Short Term Goals: 3 weeks PT Short Term Goal 1: Pt will decrease fascial restriction to Lt knee to report pain less than 3/10 for 75% of his day.  PT Short Term Goal 2: Pt will improve knee AROM to 0-115 degrees in order to ambulate with normalized gait mechanics.   Problem List Patient Active Problem List   Diagnosis Date Noted  . Acute medial meniscus tear 04/11/2013  . S/P arthroscopy of left knee 03/21/2013  . Medial meniscus, posterior horn derangement 01/31/2013  . Synovial plica of left knee 01/31/2013  . Acute medial meniscus tear of left knee 01/26/2013  . GI bleed 08/29/2012  . Iron deficiency anemia 06/21/2012  . Guaiac positive stools 06/21/2012    PT - End of Session Activity Tolerance: Patient tolerated treatment well General Behavior During Therapy: WFL for tasks assessed/performed PT Plan of Care PT Home Exercise Plan: see scanned report PT Patient Instructions: discussed normal progress s/p knee scope and f/u with MD on proper walking program.  Answered questions about diagnosis.  Consulted and Agree with Plan of Care: Patient  GP    Annett Fabian, MPT ATC 04/24/2013, 5:57 PM  Physician Documentation Your signature is required to indicate approval of the treatment plan as stated above.  Please sign and either send electronically or make a copy of this report for your files and return this physician signed original.   Please mark one 1.__approve of plan  2. ___approve of  plan with the following conditions.   ______________________________                                                          _____________________ Physician Signature                                                                                                             Date

## 2013-04-26 ENCOUNTER — Encounter: Payer: Self-pay | Admitting: *Deleted

## 2013-04-27 ENCOUNTER — Encounter: Payer: Self-pay | Admitting: Family Medicine

## 2013-04-27 ENCOUNTER — Ambulatory Visit (INDEPENDENT_AMBULATORY_CARE_PROVIDER_SITE_OTHER): Payer: Managed Care, Other (non HMO) | Admitting: Family Medicine

## 2013-04-27 VITALS — BP 140/98 | HR 70 | Ht 69.25 in | Wt 232.0 lb

## 2013-04-27 DIAGNOSIS — R7303 Prediabetes: Secondary | ICD-10-CM

## 2013-04-27 DIAGNOSIS — E785 Hyperlipidemia, unspecified: Secondary | ICD-10-CM

## 2013-04-27 DIAGNOSIS — R7309 Other abnormal glucose: Secondary | ICD-10-CM

## 2013-04-27 DIAGNOSIS — Z0000 Encounter for general adult medical examination without abnormal findings: Secondary | ICD-10-CM

## 2013-04-27 LAB — POCT GLYCOSYLATED HEMOGLOBIN (HGB A1C): Hemoglobin A1C: 6.3

## 2013-04-27 NOTE — Patient Instructions (Addendum)
Diabetes Meal Planning Guide The diabetes meal planning guide is a tool to help you plan your meals and snacks. It is important for people with diabetes to manage their blood glucose (sugar) levels. Choosing the right foods and the right amounts throughout your day will help control your blood glucose. Eating right can even help you improve your blood pressure and reach or maintain a healthy weight. CARBOHYDRATE COUNTING MADE EASY When you eat carbohydrates, they turn to sugar. This raises your blood glucose level. Counting carbohydrates can help you control this level so you feel better. When you plan your meals by counting carbohydrates, you can have more flexibility in what you eat and balance your medicine with your food intake. Carbohydrate counting simply means adding up the total amount of carbohydrate grams in your meals and snacks. Try to eat about the same amount at each meal. Foods with carbohydrates are listed below. Each portion below is 1 carbohydrate serving or 15 grams of carbohydrates. Ask your dietician how many grams of carbohydrates you should eat at each meal or snack. Grains and Starches  1 slice bread.   English muffin or hotdog/hamburger bun.   cup cold cereal (unsweetened).   cup cooked pasta or rice.   cup starchy vegetables (corn, potatoes, peas, beans, winter squash).  1 tortilla (6 inches).   bagel.  1 waffle or pancake (size of a CD).   cup cooked cereal.  4 to 6 small crackers. *Whole grain is recommended. Fruit  1 cup fresh unsweetened berries, melon, papaya, pineapple.  1 small fresh fruit.   banana or mango.   cup fruit juice (4 oz unsweetened).   cup canned fruit in natural juice or water.  2 tbs dried fruit.  12 to 15 grapes or cherries. Milk and Yogurt  1 cup fat-free or 1% milk.  1 cup soy milk.  6 oz light yogurt with sugar-free sweetener.  6 oz low-fat soy yogurt.  6 oz plain yogurt. Vegetables  1 cup raw or  cup  cooked is counted as 0 carbohydrates or a "free" food.  If you eat 3 or more servings at 1 meal, count them as 1 carbohydrate serving. Other Carbohydrates   oz chips or pretzels.   cup ice cream or frozen yogurt.   cup sherbet or sorbet.  2 inch square cake, no frosting.  1 tbs honey, sugar, jam, jelly, or syrup.  2 small cookies.  3 squares of graham crackers.  3 cups popcorn.  6 crackers.  1 cup broth-based soup.  Count 1 cup casserole or other mixed foods as 2 carbohydrate servings.  Foods with less than 20 calories in a serving may be counted as 0 carbohydrates or a "free" food. You may want to purchase a book or computer software that lists the carbohydrate gram counts of different foods. In addition, the nutrition facts panel on the labels of the foods you eat are a good source of this information. The label will tell you how big the serving size is and the total number of carbohydrate grams you will be eating per serving. Divide this number by 15 to obtain the number of carbohydrate servings in a portion. Remember, 1 carbohydrate serving equals 15 grams of carbohydrate. SERVING SIZES Measuring foods and serving sizes helps you make sure you are getting the right amount of food. The list below tells how big or small some common serving sizes are.  1 oz.........4 stacked dice.  3 oz.........Deck of cards.  1 tsp........Tip   of little finger.  1 tbs........Thumb.  2 tbs........Golf ball.   cup.......Half of a fist.  1 cup........A fist. SAMPLE DIABETES MEAL PLAN Below is a sample meal plan that includes foods from the grain and starches, dairy, vegetable, fruit, and meat groups. A dietician can individualize a meal plan to fit your calorie needs and tell you the number of servings needed from each food group. However, controlling the total amount of carbohydrates in your meal or snack is more important than making sure you include all of the food groups at every  meal. You may interchange carbohydrate containing foods (dairy, starches, and fruits). The meal plan below is an example of a 2000 calorie diet using carbohydrate counting. This meal plan has 17 carbohydrate servings. Breakfast  1 cup oatmeal (2 carb servings).   cup light yogurt (1 carb serving).  1 cup blueberries (1 carb serving).   cup almonds. Snack  1 large apple (2 carb servings).  1 low-fat string cheese stick. Lunch  Chicken breast salad.  1 cup spinach.   cup chopped tomatoes.  2 oz chicken breast, sliced.  2 tbs low-fat Italian dressing.  12 whole-wheat crackers (2 carb servings).  12 to 15 grapes (1 carb serving).  1 cup low-fat milk (1 carb serving). Snack  1 cup carrots.   cup hummus (1 carb serving). Dinner  3 oz broiled salmon.  1 cup brown rice (3 carb servings). Snack  1  cups steamed broccoli (1 carb serving) drizzled with 1 tsp olive oil and lemon juice.  1 cup light pudding (2 carb servings). DIABETES MEAL PLANNING WORKSHEET Your dietician can use this worksheet to help you decide how many servings of foods and what types of foods are right for you.  BREAKFAST Food Group and Servings / Carb Servings Grain/Starches __________________________________ Dairy __________________________________________ Vegetable ______________________________________ Fruit ___________________________________________ Meat __________________________________________ Fat ____________________________________________ LUNCH Food Group and Servings / Carb Servings Grain/Starches ___________________________________ Dairy ___________________________________________ Fruit ____________________________________________ Meat ___________________________________________ Fat _____________________________________________ DINNER Food Group and Servings / Carb Servings Grain/Starches ___________________________________ Dairy  ___________________________________________ Fruit ____________________________________________ Meat ___________________________________________ Fat _____________________________________________ SNACKS Food Group and Servings / Carb Servings Grain/Starches ___________________________________ Dairy ___________________________________________ Vegetable _______________________________________ Fruit ____________________________________________ Meat ___________________________________________ Fat _____________________________________________ DAILY TOTALS Starches _________________________ Vegetable ________________________ Fruit ____________________________ Dairy ____________________________ Meat ____________________________ Fat ______________________________ Document Released: 07/23/2005 Document Revised: 01/18/2012 Document Reviewed: 06/03/2009 [employer] Patient Information 2014 [employer] DASH Diet The DASH diet stands for "Dietary Approaches to Stop Hypertension." It is a healthy eating plan that has been shown to reduce high blood pressure (hypertension) in as little as 14 days, while also possibly providing other significant health benefits. These other health benefits include reducing the risk of breast cancer after menopause and reducing the risk of type 2 diabetes, heart disease, colon cancer, and stroke. Health benefits also include weight loss and slowing kidney failure in patients with chronic kidney disease.  DIET GUIDELINES  Limit salt (sodium). Your diet should contain less than 1500 mg of sodium daily.  Limit refined or processed carbohydrates. Your diet should include mostly whole grains. Desserts and added sugars should be used sparingly.  Include small amounts of heart-healthy fats. These types of fats include nuts, oils, and tub margarine. Limit saturated and trans fats. These fats have been shown to be harmful in the body. CHOOSING FOODS  The following food groups  are based on a 2000 calorie diet. See your Registered Dietitian for individual calorie needs. Grains and Grain Products (6 to 8 servings daily)  Eat More Often:   Whole-wheat bread, brown rice, whole-grain or wheat pasta, quinoa, popcorn without added fat or salt (air popped).  Eat Less Often: White bread, white pasta, white rice, cornbread. Vegetables (4 to 5 servings daily)  Eat More Often: Fresh, frozen, and canned vegetables. Vegetables may be raw, steamed, roasted, or grilled with a minimal amount of fat.  Eat Less Often/Avoid: Creamed or fried vegetables. Vegetables in a cheese sauce. Fruit (4 to 5 servings daily)  Eat More Often: All fresh, canned (in natural juice), or frozen fruits. Dried fruits without added sugar. One hundred percent fruit juice ( cup [237 mL] daily).  Eat Less Often: Dried fruits with added sugar. Canned fruit in light or heavy syrup. [employer], Fish, and Poultry (2 servings or less daily. One serving is 3 to 4 oz [85-114 g]).  Eat More Often: Ninety percent or leaner ground beef, tenderloin, sirloin. Round cuts of beef, chicken breast, [place] breast. All fish. Grill, bake, or broil your meat. Nothing should be fried.  Eat Less Often/Avoid: Fatty cuts of meat, [place], or chicken leg, thigh, or wing. Fried cuts of meat or fish. Dairy (2 to 3 servings)  Eat More Often: Low-fat or fat-free milk, low-fat plain or light yogurt, reduced-fat or part-skim cheese.  Eat Less Often/Avoid: Milk (whole, 2%).Whole milk yogurt. Full-fat cheeses. Nuts, Seeds, and Legumes (4 to 5 servings per week)  Eat More Often: All without added salt.  Eat Less Often/Avoid: Salted nuts and seeds, canned beans with added salt. Fats and Sweets (limited)  Eat More Often: Vegetable oils, tub margarines without trans fats, sugar-free gelatin. Mayonnaise and salad dressings.  Eat Less Often/Avoid: Coconut oils, palm oils, butter, stick margarine, cream, half and half, cookies, candy,  pie. FOR MORE INFORMATION The Dash Diet Eating Plan: www.dashdiet.org Document Released: 10/15/2011 Document Revised: 01/18/2012 Document Reviewed: 10/15/2011 [facility] Patient Information 2014 [place], [place].   OFFICE VISIT OCT AND LABS BEFORE VISIT, CALL us ( Lipid / HgA1C / Ferritin /Liver / TIBC )

## 2013-04-27 NOTE — Progress Notes (Signed)
  Subjective:    Patient ID: Jacob Hanna, male    DOB: [birth year], 52 y.o.   MRN: [id number]  HPI Patient is here today for an annual wellness exam. Patient has concerns about hand numbness that has been going on for about 6 months now. Patient this is only at 29 when he sleeps he keeps his hands somewhat cocked to the side I told him that's probably causing carpal tunnel symptoms I recommended that he wear brace at nighttime We talked at length about diet exercise safety measures. No rectal bleeding no hematuria.  Review of Systems  Constitutional: Negative for fever, activity change and appetite change.  HENT: Negative for congestion, rhinorrhea and neck pain.   Eyes: Negative for discharge.  Respiratory: Negative for cough and wheezing.   Cardiovascular: Negative for chest pain.  Gastrointestinal: Negative for vomiting, abdominal pain and blood in stool.  Genitourinary: Negative for frequency and difficulty urinating.  Skin: Negative for rash.  Allergic/Immunologic: Negative for environmental allergies and food allergies.  Neurological: Negative for weakness and headaches.  Psychiatric/Behavioral: Negative for agitation.       Objective:   Physical Exam  Nursing note and vitals reviewed. Constitutional: He appears well-developed and well-nourished.  HENT:  Head: Normocephalic and atraumatic.  Right Ear: External ear normal.  Left Ear: External ear normal.  Nose: Nose normal.  Mouth/Throat: Oropharynx is clear and moist.  Eyes: EOM are normal. Pupils are equal, round, and reactive to light.  Neck: Normal range of motion. Neck supple. No thyromegaly present.  Cardiovascular: Normal rate, regular rhythm and normal heart sounds.   No murmur heard. Pulmonary/Chest: Effort normal and breath sounds normal. No respiratory distress. He has no wheezes.  Abdominal: Soft. Bowel sounds are normal. He exhibits no distension and no mass. There is no tenderness.  Genitourinary: Penis  normal.  Musculoskeletal: Normal range of motion. He exhibits no edema.  Lymphadenopathy:    He has no cervical adenopathy.  Neurological: He is alert. He exhibits normal muscle tone.  Skin: Skin is warm and dry. No erythema.  Psychiatric: He has a normal mood and affect. His behavior is normal. Judgment normal.          Assessment & Plan:  #1 prediabetes counseled him regarding starches exercise weight reduction #2 blood pressure on recheck looks good continue increase exercise #3 wellness exam overall good recommend to followup in the fall time check lab work at that time including metabolic 7, liver profile, hemoglobin A1c, lipid profile, ferritin, TIBC, CBC for the following reasons: Diabetes, iron deficient anemia  Patient was told that if his cholesterol does not get under significantly better control by fall time he will need to be on medication

## 2013-05-08 ENCOUNTER — Ambulatory Visit (INDEPENDENT_AMBULATORY_CARE_PROVIDER_SITE_OTHER): Payer: Managed Care, Other (non HMO) | Admitting: Orthopedic Surgery

## 2013-05-08 ENCOUNTER — Encounter: Payer: Self-pay | Admitting: Orthopedic Surgery

## 2013-05-08 VITALS — BP 130/86 | Ht 71.0 in | Wt 234.0 lb

## 2013-05-08 DIAGNOSIS — M23322 Other meniscus derangements, posterior horn of medial meniscus, left knee: Secondary | ICD-10-CM

## 2013-05-08 DIAGNOSIS — Z9889 Other specified postprocedural states: Secondary | ICD-10-CM

## 2013-05-08 DIAGNOSIS — M23329 Other meniscus derangements, posterior horn of medial meniscus, unspecified knee: Secondary | ICD-10-CM

## 2013-05-08 NOTE — Patient Instructions (Addendum)
RTW note per his request

## 2013-05-09 ENCOUNTER — Ambulatory Visit: Payer: Managed Care, Other (non HMO) | Admitting: Orthopedic Surgery

## 2013-05-09 ENCOUNTER — Encounter: Payer: Self-pay | Admitting: Orthopedic Surgery

## 2013-05-09 NOTE — Progress Notes (Signed)
Patient ID: Jacob Hanna, male   DOB: 09-14-1961, 52 y.o.   MRN: [id number] Chief Complaint  Patient presents with  . Follow-up    Post op 2 SALK DOS 04/14/13    BP 130/86  Ht 5\' 11"  (1.803 m)  Wt 234 lb (106.142 kg)  BMI 32.65 kg/m2 This patient is doing very well with home physical therapy. He has slight swelling in his knee but has full flexion full extension and good quadriceps sets and straight leg raise.  He will return to work next week he will continue with the same regimen of quadriceps exercises short arc squatting ice  Followup 3 weeks

## 2013-05-29 ENCOUNTER — Ambulatory Visit (INDEPENDENT_AMBULATORY_CARE_PROVIDER_SITE_OTHER): Payer: Managed Care, Other (non HMO) | Admitting: Orthopedic Surgery

## 2013-05-29 ENCOUNTER — Encounter: Payer: Self-pay | Admitting: Orthopedic Surgery

## 2013-05-29 VITALS — BP 123/73 | Ht 71.0 in | Wt 234.0 lb

## 2013-05-29 DIAGNOSIS — Z9889 Other specified postprocedural states: Secondary | ICD-10-CM

## 2013-05-29 NOTE — Progress Notes (Signed)
Patient ID: Jacob Hanna, male   DOB: 01-13-1961, 52 y.o.   MRN: [id number] Chief Complaint  Patient presents with  . Follow-up    3 week recheck left knee SALK 04/14/13    BP 123/73  Ht 5\' 11"  (1.803 m)  Wt 234 lb (106.142 kg)  BMI 32.65 kg/m2  Encounter Diagnosis  Name Primary?  . S/P arthroscopy of left knee Yes    The patient continues to improve with a home exercise program he was able to walk through [employer] weekend. His knee swelling is down his pain is controlled well his knee looks good he will return as needed

## 2013-05-29 NOTE — Patient Instructions (Addendum)
Hep 2 x per week

## 2013-06-17 NOTE — Procedures (Signed)
Jacob Hanna, Jacob Hanna              ACCOUNT NO.:  [id number]  MEDICAL RECORD NO.:  [id number]          PATIENT TYPE:  OUT  LOCATION:  SLEEP LAB                     FACILITY:  [facility]  PHYSICIAN:  Juandaniel Manfredo A. Gerilyn Pilgrim, M.D. DATE OF BIRTH:  [birth year]  DATE OF STUDY:  03/27/2013                           NOCTURNAL HOME SLEEP STUDY  REFERRING PHYSICIAN:  Scott A. Luking, MD   INDICATION FOR STUDY:  A 52 year old man, who presents with hypersomnia, restless sleep, insomnia, and sleepiness during daytime.  This is a home sleep study recording with 4 leads use.  MEDICATION LIST:  Not available.  The patient was monitored for a total of 449 minutes.  The patient's AHI index is 8.  The baseline oxygen saturation is 95, lowest saturation 88, average heart rate is 54.  IMPRESSION:  Mild obstructive sleep apnea syndrome not requiring positive pressure treatment.   Pascual Mantel A. Gerilyn Pilgrim, M.D.   KAD/MEDQ  D:  06/17/2013 13:49:17  T:  06/17/2013 15:13:50  Job:  [id number]

## 2013-09-14 ENCOUNTER — Other Ambulatory Visit: Payer: Self-pay

## 2014-05-07 ENCOUNTER — Telehealth: Payer: Self-pay | Admitting: Family Medicine

## 2014-05-07 DIAGNOSIS — E785 Hyperlipidemia, unspecified: Secondary | ICD-10-CM

## 2014-05-07 DIAGNOSIS — R7301 Impaired fasting glucose: Secondary | ICD-10-CM

## 2014-05-07 DIAGNOSIS — Z79899 Other long term (current) drug therapy: Secondary | ICD-10-CM

## 2014-05-07 DIAGNOSIS — D649 Anemia, unspecified: Secondary | ICD-10-CM

## 2014-05-07 DIAGNOSIS — Z125 Encounter for screening for malignant neoplasm of prostate: Secondary | ICD-10-CM

## 2014-05-07 NOTE — Telephone Encounter (Signed)
Patient needs order for blood work. °

## 2014-05-07 NOTE — Telephone Encounter (Signed)
I would recommend lipid, hemoglobin A1c, metastases 7, liver, PSA, CBC, ferritin-he does have history of hyperglycemia, hyperlipidemia, iron deficient anemia. He should do lab work and do a followup office visit

## 2014-05-07 NOTE — Telephone Encounter (Signed)
Notified patient that bloodwork has been ordered and can report to lab 

## 2014-05-07 NOTE — Telephone Encounter (Signed)
Lipid, BMP, PSA, ferritin, glucose and cbc on 04/12/13 HgbA1C done 04/27/13

## 2014-05-18 LAB — CBC WITH DIFFERENTIAL/PLATELET
Basophils Absolute: 0 10*3/uL (ref 0.0–0.1)
Basophils Relative: 0 % (ref 0–1)
Eosinophils Absolute: 0.3 10*3/uL (ref 0.0–0.7)
Eosinophils Relative: 5 % (ref 0–5)
HCT: 42.1 % (ref 39.0–52.0)
HEMOGLOBIN: 14.9 g/dL (ref 13.0–17.0)
LYMPHS ABS: 1.9 10*3/uL (ref 0.7–4.0)
LYMPHS PCT: 38 % (ref 12–46)
MCH: 31.8 pg (ref 26.0–34.0)
MCHC: 35.4 g/dL (ref 30.0–36.0)
MCV: 89.8 fL (ref 78.0–100.0)
MONOS PCT: 10 % (ref 3–12)
Monocytes Absolute: 0.5 10*3/uL (ref 0.1–1.0)
NEUTROS ABS: 2.4 10*3/uL (ref 1.7–7.7)
NEUTROS PCT: 47 % (ref 43–77)
PLATELETS: 266 10*3/uL (ref 150–400)
RBC: 4.69 MIL/uL (ref 4.22–5.81)
RDW: 13.4 % (ref 11.5–15.5)
WBC: 5.1 10*3/uL (ref 4.0–10.5)

## 2014-05-18 LAB — LIPID PANEL
CHOL/HDL RATIO: 5.1 ratio
CHOLESTEROL: 182 mg/dL (ref 0–200)
HDL: 36 mg/dL — ABNORMAL LOW (ref >39)
LDL CALC: 115 mg/dL — AB (ref 0–99)
Triglycerides: 154 mg/dL — ABNORMAL HIGH (ref <150)
VLDL: 31 mg/dL (ref 0–40)

## 2014-05-18 LAB — BASIC METABOLIC PANEL
BUN: 15 mg/dL (ref 6–23)
CALCIUM: 9.4 mg/dL (ref 8.4–10.5)
CO2: 29 meq/L (ref 19–32)
CREATININE: 1.02 mg/dL (ref 0.50–1.35)
Chloride: 102 mEq/L (ref 96–112)
GLUCOSE: 97 mg/dL (ref 70–99)
Potassium: 4.8 mEq/L (ref 3.5–5.3)
SODIUM: 139 meq/L (ref 135–145)

## 2014-05-18 LAB — HEPATIC FUNCTION PANEL
ALBUMIN: 4.3 g/dL (ref 3.5–5.2)
ALK PHOS: 109 U/L (ref 39–117)
ALT: 11 U/L (ref 0–53)
AST: 15 U/L (ref 0–37)
BILIRUBIN INDIRECT: 0.4 mg/dL (ref 0.2–1.2)
BILIRUBIN TOTAL: 0.5 mg/dL (ref 0.2–1.2)
Bilirubin, Direct: 0.1 mg/dL (ref 0.0–0.3)
TOTAL PROTEIN: 7.3 g/dL (ref 6.0–8.3)

## 2014-05-18 LAB — HEMOGLOBIN A1C
Hgb A1c MFr Bld: 5.7 % — ABNORMAL HIGH (ref <5.7)
MEAN PLASMA GLUCOSE: 117 mg/dL — AB (ref <117)

## 2014-05-18 LAB — FERRITIN: FERRITIN: 32 ng/mL (ref 22–322)

## 2014-05-19 LAB — PSA: PSA: 0.81 ng/mL (ref <=4.00)

## 2014-05-28 ENCOUNTER — Encounter: Payer: Self-pay | Admitting: Family Medicine

## 2014-05-28 ENCOUNTER — Ambulatory Visit (INDEPENDENT_AMBULATORY_CARE_PROVIDER_SITE_OTHER): Payer: Managed Care, Other (non HMO) | Admitting: Family Medicine

## 2014-05-28 VITALS — BP 124/80 | Ht 71.0 in | Wt 222.6 lb

## 2014-05-28 DIAGNOSIS — Z0000 Encounter for general adult medical examination without abnormal findings: Secondary | ICD-10-CM

## 2014-05-28 NOTE — Progress Notes (Signed)
   Subjective:    Patient ID: Jacob Hanna, male    DOB: 06/26/1961, 53 y.o.   MRN: [id number]  HPI The patient comes in today for a wellness visit.    A review of their health history was completed.  A review of medications was also completed.  Any needed refills; no  Eating habits: trying to eat good  Falls/  MVA accidents in past few months: no  Regular exercise: yes  Specialist pt sees on regular basis: no  Preventative health issues were discussed.   Additional concerns: no We talked about diet we talked about exercise we reviewed over his lab work. He denies any chest tightness pressure or pain   Review of Systems  Constitutional: Negative for fever, activity change and appetite change.  HENT: Negative for congestion and rhinorrhea.   Eyes: Negative for discharge.  Respiratory: Negative for cough and wheezing.   Cardiovascular: Negative for chest pain.  Gastrointestinal: Negative for vomiting, abdominal pain and blood in stool.  Genitourinary: Negative for frequency and difficulty urinating.  Musculoskeletal: Negative for neck pain.  Skin: Negative for rash.  Allergic/Immunologic: Negative for environmental allergies and food allergies.  Neurological: Negative for weakness and headaches.  Psychiatric/Behavioral: Negative for agitation.       Objective:   Physical Exam  Constitutional: He appears well-developed and well-nourished.  HENT:  Head: Normocephalic and atraumatic.  Right Ear: External ear normal.  Left Ear: External ear normal.  Nose: Nose normal.  Mouth/Throat: Oropharynx is clear and moist.  Eyes: EOM are normal. Pupils are equal, round, and reactive to light.  Neck: Normal range of motion. Neck supple. No thyromegaly present.  Cardiovascular: Normal rate, regular rhythm and normal heart sounds.   No murmur heard. Pulmonary/Chest: Effort normal and breath sounds normal. No respiratory distress. He has no wheezes.  Abdominal: Soft. Bowel  sounds are normal. He exhibits no distension and no mass. There is no tenderness.  Genitourinary: Penis normal.  Musculoskeletal: Normal range of motion. He exhibits no edema.  Lymphadenopathy:    He has no cervical adenopathy.  Neurological: He is alert. He exhibits normal muscle tone.  Skin: Skin is warm and dry. No erythema.  Psychiatric: He has a normal mood and affect. His behavior is normal. Judgment normal.          Assessment & Plan:  Prediabetes improved  hyperlip improved- based on ACC risk 4.7% no statin cutrrently  Safety dietary discussed He has lost some weight he is exercising is watching her he he is to work hard at this he will followup if ongoing troubles otherwise recheck in one

## 2014-11-05 ENCOUNTER — Ambulatory Visit (INDEPENDENT_AMBULATORY_CARE_PROVIDER_SITE_OTHER): Payer: Managed Care, Other (non HMO) | Admitting: Family Medicine

## 2014-11-05 ENCOUNTER — Encounter: Payer: Self-pay | Admitting: Family Medicine

## 2014-11-05 VITALS — BP 128/84 | Temp 98.5°F | Ht 70.0 in | Wt 228.0 lb

## 2014-11-05 DIAGNOSIS — H8110 Benign paroxysmal vertigo, unspecified ear: Secondary | ICD-10-CM

## 2014-11-05 DIAGNOSIS — J0110 Acute frontal sinusitis, unspecified: Secondary | ICD-10-CM

## 2014-11-05 MED ORDER — MECLIZINE HCL 25 MG PO TABS: 25.0000 mg | ORAL_TABLET | Freq: Three times a day (TID) | ORAL | Status: DC | PRN

## 2014-11-05 MED ORDER — LEVOFLOXACIN 500 MG PO TABS: 500.0000 mg | ORAL_TABLET | Freq: Every day | ORAL | Status: DC

## 2014-11-05 NOTE — Patient Instructions (Signed)

## 2014-11-05 NOTE — Progress Notes (Signed)
   Subjective:    Patient ID: Jacob Hanna, male    DOB: 01/26/1961, 53 y.o.   MRN: [id number]  Cough This is a new problem. The current episode started 1 to 4 weeks ago. Associated symptoms include ear pain, headaches, rhinorrhea and wheezing. Pertinent negatives include no chest pain or fever. Associated symptoms comments: Fatigue, chills, Runny nose, . Treatments tried: OTC decongestant. The treatment provided mild relief.   Ear pressure started about 3 to 4 weeks ago  now with congestion and wheezing Low energy Some sweats and chills no fever No V Occasional nausea Coughing up phlegm Sob with activity otc meda snd rest Appetite fair Missed work today   Review of Systems  Constitutional: Negative for fever and activity change.  HENT: Positive for congestion, ear pain and rhinorrhea.   Eyes: Negative for discharge.  Respiratory: Positive for cough and wheezing.   Cardiovascular: Negative for chest pain.  Neurological: Positive for dizziness and headaches.       Objective:   Physical Exam  Constitutional: He appears well-developed.  HENT:  Head: Normocephalic.  Mouth/Throat: Oropharynx is clear and moist. No oropharyngeal exudate.  Neck: Normal range of motion.  Cardiovascular: Normal rate, regular rhythm and normal heart sounds.   No murmur heard. Pulmonary/Chest: Effort normal and breath sounds normal. He has no wheezes.  Lymphadenopathy:    He has no cervical adenopathy.  Neurological: He exhibits normal muscle tone.  Skin: Skin is warm and dry.  Nursing note and vitals reviewed.         Assessment & Plan:   Viral syndrome Acute frontal sinusitis Benign positional vertigo related to sinusitis Levaquin 10 days Antivert when necessary Epley maneuver was shown patient benefited from this he was given a sheet on it as well If not significantly resolved within the next several days may need referral to ENT Work excuse next 3 days

## 2015-05-06 ENCOUNTER — Telehealth: Payer: Self-pay | Admitting: Family Medicine

## 2015-05-06 DIAGNOSIS — Z79899 Other long term (current) drug therapy: Secondary | ICD-10-CM

## 2015-05-06 DIAGNOSIS — Z125 Encounter for screening for malignant neoplasm of prostate: Secondary | ICD-10-CM

## 2015-05-06 DIAGNOSIS — E785 Hyperlipidemia, unspecified: Secondary | ICD-10-CM

## 2015-05-06 DIAGNOSIS — R7303 Prediabetes: Secondary | ICD-10-CM

## 2015-05-06 DIAGNOSIS — D509 Iron deficiency anemia, unspecified: Secondary | ICD-10-CM

## 2015-05-06 NOTE — Telephone Encounter (Signed)
Pt is requesting lab orders to be sent over for his wellness visit  Last labs per epic were: lipid,a1c,bmp,hepatic,psa,cbc,and ferritin on 05/19/15

## 2015-05-06 NOTE — Telephone Encounter (Signed)
Blood work orders placed in EPIC. Patient notified. 

## 2015-05-06 NOTE — Telephone Encounter (Signed)
May repeat his labs. Order the same ones

## 2015-05-18 LAB — LIPID PANEL
CHOL/HDL RATIO: 4.7 ratio (ref 0.0–5.0)
Cholesterol, Total: 193 mg/dL (ref 100–199)
HDL: 41 mg/dL (ref >39)
LDL CALC: 132 mg/dL — AB (ref 0–99)
Triglycerides: 101 mg/dL (ref 0–149)
VLDL Cholesterol Cal: 20 mg/dL (ref 5–40)

## 2015-05-18 LAB — HEPATIC FUNCTION PANEL
ALBUMIN: 4.5 g/dL (ref 3.5–5.5)
ALT: 11 IU/L (ref 0–44)
AST: 18 IU/L (ref 0–40)
Alkaline Phosphatase: 115 IU/L (ref 39–117)
BILIRUBIN TOTAL: 0.4 mg/dL (ref 0.0–1.2)
Bilirubin, Direct: 0.1 mg/dL (ref 0.00–0.40)
Total Protein: 7.1 g/dL (ref 6.0–8.5)

## 2015-05-18 LAB — BASIC METABOLIC PANEL
BUN / CREAT RATIO: 16 (ref 9–20)
BUN: 16 mg/dL (ref 6–24)
CALCIUM: 9.1 mg/dL (ref 8.7–10.2)
CHLORIDE: 101 mmol/L (ref 97–108)
CO2: 23 mmol/L (ref 18–29)
Creatinine, Ser: 0.98 mg/dL (ref 0.76–1.27)
GFR calc Af Amer: 102 mL/min/{1.73_m2} (ref >59)
GFR calc non Af Amer: 88 mL/min/{1.73_m2} (ref >59)
GLUCOSE: 111 mg/dL — AB (ref 65–99)
POTASSIUM: 4.5 mmol/L (ref 3.5–5.2)
SODIUM: 140 mmol/L (ref 134–144)

## 2015-05-18 LAB — HEMOGLOBIN A1C
Est. average glucose Bld gHb Est-mCnc: 131 mg/dL
Hgb A1c MFr Bld: 6.2 % — ABNORMAL HIGH (ref 4.8–5.6)

## 2015-05-18 LAB — PSA: PROSTATE SPECIFIC AG, SERUM: 0.7 ng/mL (ref 0.0–4.0)

## 2015-05-18 LAB — CBC WITH DIFFERENTIAL/PLATELET
BASOS: 0 %
Basophils Absolute: 0 10*3/uL (ref 0.0–0.2)
EOS (ABSOLUTE): 0.2 10*3/uL (ref 0.0–0.4)
EOS: 4 %
HEMATOCRIT: 39 % (ref 37.5–51.0)
HEMOGLOBIN: 13.3 g/dL (ref 12.6–17.7)
Immature Grans (Abs): 0 10*3/uL (ref 0.0–0.1)
Immature Granulocytes: 0 %
LYMPHS: 40 %
Lymphocytes Absolute: 2 10*3/uL (ref 0.7–3.1)
MCH: 29.8 pg (ref 26.6–33.0)
MCHC: 34.1 g/dL (ref 31.5–35.7)
MCV: 87 fL (ref 79–97)
MONOS ABS: 0.6 10*3/uL (ref 0.1–0.9)
Monocytes: 12 %
NEUTROS ABS: 2.2 10*3/uL (ref 1.4–7.0)
Neutrophils: 44 %
Platelets: 314 10*3/uL (ref 150–379)
RBC: 4.46 x10E6/uL (ref 4.14–5.80)
RDW: 14.2 % (ref 12.3–15.4)
WBC: 5 10*3/uL (ref 3.4–10.8)

## 2015-05-18 LAB — FERRITIN: Ferritin: 11 ng/mL — ABNORMAL LOW (ref 30–400)

## 2015-05-31 ENCOUNTER — Encounter: Payer: Self-pay | Admitting: Family Medicine

## 2015-05-31 ENCOUNTER — Ambulatory Visit (INDEPENDENT_AMBULATORY_CARE_PROVIDER_SITE_OTHER): Payer: Managed Care, Other (non HMO) | Admitting: Family Medicine

## 2015-05-31 VITALS — BP 130/82 | Ht 69.5 in | Wt 219.0 lb

## 2015-05-31 DIAGNOSIS — Z0000 Encounter for general adult medical examination without abnormal findings: Secondary | ICD-10-CM

## 2015-05-31 DIAGNOSIS — R7303 Prediabetes: Secondary | ICD-10-CM

## 2015-05-31 DIAGNOSIS — R7309 Other abnormal glucose: Secondary | ICD-10-CM | POA: Diagnosis not present

## 2015-05-31 NOTE — Progress Notes (Signed)
   Subjective:    Patient ID: Jacob Hanna, male    DOB: 08/17/1961, 54 y.o.   MRN: [id number]  HPI The patient comes in today for a wellness visit.  Trying to lose weight 6 to 7 hours sleep  A review of their health history was completed.  A review of medications was also completed.  Any needed refills; no- not taking any meds  Eating habits: health conscious  Falls/  MVA accidents in past few months: none  Regular exercise: [employer] activities, [employer], swimming  Specialist pt sees on regular basis: none  Preventative health issues were discussed.   Additional concerns: none  I reviewed over his lab work.  Review of Systems  Constitutional: Negative for fever, activity change and appetite change.  HENT: Negative for congestion and rhinorrhea.   Eyes: Negative for discharge.  Respiratory: Negative for cough and wheezing.   Cardiovascular: Negative for chest pain.  Gastrointestinal: Negative for vomiting, abdominal pain and blood in stool.  Genitourinary: Negative for frequency and difficulty urinating.  Musculoskeletal: Negative for neck pain.  Skin: Negative for rash.  Allergic/Immunologic: Negative for environmental allergies and food allergies.  Neurological: Negative for weakness and headaches.  Psychiatric/Behavioral: Negative for agitation.       Objective:   Physical Exam  Constitutional: He appears well-developed and well-nourished.  HENT:  Head: Normocephalic and atraumatic.  Right Ear: External ear normal.  Left Ear: External ear normal.  Nose: Nose normal.  Mouth/Throat: Oropharynx is clear and moist.  Eyes: EOM are normal. Pupils are equal, round, and reactive to light.  Neck: Normal range of motion. Neck supple. No thyromegaly present.  Cardiovascular: Normal rate, regular rhythm and normal heart sounds.   No murmur heard. Pulmonary/Chest: Effort normal and breath sounds normal. No respiratory distress. He has no wheezes.  Abdominal: Soft.  Bowel sounds are normal. He exhibits no distension and no mass. There is no tenderness.  Genitourinary: Prostate normal and penis normal.  Musculoskeletal: Normal range of motion. He exhibits no edema.  Lymphadenopathy:    He has no cervical adenopathy.  Neurological: He is alert. He exhibits normal muscle tone.  Skin: Skin is warm and dry. No erythema.  Psychiatric: He has a normal mood and affect. His behavior is normal. Judgment normal.          Assessment & Plan:  Safety, dietary all reviewed patient up-to-date on his colonoscopy. Lab work reviewed with patient.  Patient has developing prediabetes very important for him to watch diet lose weight follow-up in 4 months to recheck A1c

## 2015-12-02 ENCOUNTER — Encounter: Payer: Self-pay | Admitting: Family Medicine

## 2015-12-02 ENCOUNTER — Ambulatory Visit (INDEPENDENT_AMBULATORY_CARE_PROVIDER_SITE_OTHER): Payer: Managed Care, Other (non HMO) | Admitting: Family Medicine

## 2015-12-02 VITALS — BP 114/74 | Ht 69.5 in | Wt 217.0 lb

## 2015-12-02 DIAGNOSIS — S4991XD Unspecified injury of right shoulder and upper arm, subsequent encounter: Secondary | ICD-10-CM

## 2015-12-02 DIAGNOSIS — G4733 Obstructive sleep apnea (adult) (pediatric): Secondary | ICD-10-CM | POA: Diagnosis not present

## 2015-12-02 DIAGNOSIS — D509 Iron deficiency anemia, unspecified: Secondary | ICD-10-CM

## 2015-12-02 DIAGNOSIS — R7303 Prediabetes: Secondary | ICD-10-CM

## 2015-12-02 DIAGNOSIS — R5383 Other fatigue: Secondary | ICD-10-CM

## 2015-12-02 LAB — POCT GLYCOSYLATED HEMOGLOBIN (HGB A1C): HEMOGLOBIN A1C: 5.1

## 2015-12-02 MED ORDER — MELOXICAM 15 MG PO TABS: 15.0000 mg | ORAL_TABLET | Freq: Every day | ORAL | Status: DC

## 2015-12-02 NOTE — Progress Notes (Signed)
   Subjective:    Patient ID: Jacob Hanna, male    DOB: August 18, 1961, 55 y.o.   MRN: [id number]  Shoulder Pain  This is a recurrent problem. The current episode started 1 to 4 weeks ago. The problem occurs 2 to 4 times per day. The problem has been waxing and waning. The quality of the pain is described as sharp. The pain is moderate. Pertinent negatives include no fever, inability to bear weight or joint locking. The symptoms are aggravated by activity. He has tried nothing for the symptoms. The treatment provided mild relief.    Patent arrives for follow up on prediabetes and recheck HgbA1c. patient has been cutting back on starches and sugary foods trying to eat healthier.  Patient has c/o right shoulder pain see above.   fatigue.all day fatigue Lots of driving,some walking Energy in the am better Feels like he could nap Dietary reasonable Active but no purposeful exercise,some swimming Snores at night, no rectal bleeding Patient finds his overall energy level is depleted at the in the the day does not feel like exercising or doing other activity. Review of Systems  Constitutional: Negative for fever.   patient denies high fever chills sweats denies cough chest tightness pressure pain denies rectal bleeding denies hematuria he does relate right shoulder pain. Patient denies erectile dysfunction    Objective:   Physical Exam On physical exam lungs are clear no crackles respiratory rate is normal neck no masses, mildly generous, heart regular no murmurs pulses normal extremities no edema blood pressure very good  Shoulder-negative apprehension test strength overall good. Subjective soreness in the shoulder.     Assessment & Plan:  Shoulder pain probable inflammation of the rotator cuff but no evidence of a tear I recommend gentle range of motion exercises as well as anti-inflammatory over the next couple weeks if ongoing troubles referral to orthopedics presently I don't recommend  doing a shot  Fatigue and tiredness-he has a history of iron deficiency he had a colonoscopy EGD back in 2013 denies any bleeding currently we will check Hemoccult cards also check CBC ferritin and thyroid function. I don't feel indicated to check testosterone level. Patient will be doing sleep survey. He had a sleep study done in 2014 which showed mild sleep apnea but not bad enough to be on CPAP this certainly could be different given it was 3 years ago. Therefore he may end up needing to have a sleep study.  Prediabetes under very good control follow-up in 6 months for wellness exam continue healthy diet

## 2015-12-03 LAB — FERRITIN: FERRITIN: 9 ng/mL — AB (ref 30–400)

## 2015-12-03 LAB — CBC WITH DIFFERENTIAL/PLATELET
BASOS ABS: 0 10*3/uL (ref 0.0–0.2)
Basos: 1 %
EOS (ABSOLUTE): 0.3 10*3/uL (ref 0.0–0.4)
Eos: 5 %
HEMOGLOBIN: 13.2 g/dL (ref 12.6–17.7)
Hematocrit: 38.9 % (ref 37.5–51.0)
IMMATURE GRANS (ABS): 0 10*3/uL (ref 0.0–0.1)
Immature Granulocytes: 0 %
LYMPHS: 41 %
Lymphocytes Absolute: 2.2 10*3/uL (ref 0.7–3.1)
MCH: 29.1 pg (ref 26.6–33.0)
MCHC: 33.9 g/dL (ref 31.5–35.7)
MCV: 86 fL (ref 79–97)
MONOCYTES: 12 %
Monocytes Absolute: 0.6 10*3/uL (ref 0.1–0.9)
Neutrophils Absolute: 2.2 10*3/uL (ref 1.4–7.0)
Neutrophils: 41 %
Platelets: 328 10*3/uL (ref 150–379)
RBC: 4.54 x10E6/uL (ref 4.14–5.80)
RDW: 16.4 % — ABNORMAL HIGH (ref 12.3–15.4)
WBC: 5.3 10*3/uL (ref 3.4–10.8)

## 2015-12-03 LAB — T4, FREE: Free T4: 1.1 ng/dL (ref 0.82–1.77)

## 2015-12-03 LAB — TSH: TSH: 1.48 u[IU]/mL (ref 0.450–4.500)

## 2015-12-05 ENCOUNTER — Encounter: Payer: Self-pay | Admitting: Family Medicine

## 2015-12-05 ENCOUNTER — Encounter (INDEPENDENT_AMBULATORY_CARE_PROVIDER_SITE_OTHER): Payer: Self-pay | Admitting: *Deleted

## 2015-12-05 NOTE — Addendum Note (Signed)
Addended by: Jeralene Peters on: 12/05/2015 08:48 AM   Modules accepted: Orders

## 2015-12-10 ENCOUNTER — Telehealth: Payer: Self-pay | Admitting: Family Medicine

## 2015-12-10 ENCOUNTER — Ambulatory Visit (INDEPENDENT_AMBULATORY_CARE_PROVIDER_SITE_OTHER): Payer: Managed Care, Other (non HMO) | Admitting: Family Medicine

## 2015-12-10 ENCOUNTER — Encounter: Payer: Self-pay | Admitting: Family Medicine

## 2015-12-10 VITALS — BP 114/76 | Temp 97.7°F | Ht 69.5 in | Wt 230.0 lb

## 2015-12-10 DIAGNOSIS — G4730 Sleep apnea, unspecified: Secondary | ICD-10-CM

## 2015-12-10 DIAGNOSIS — D649 Anemia, unspecified: Secondary | ICD-10-CM

## 2015-12-10 DIAGNOSIS — J111 Influenza due to unidentified influenza virus with other respiratory manifestations: Secondary | ICD-10-CM

## 2015-12-10 LAB — POC HEMOCCULT BLD/STL (HOME/3-CARD/SCREEN)
Card #2 Fecal Occult Blod, POC: NEGATIVE
FECAL OCCULT BLD: NEGATIVE
Fecal Occult Blood, POC: POSITIVE — AB

## 2015-12-10 MED ORDER — AZITHROMYCIN 250 MG PO TABS
ORAL_TABLET | ORAL | Status: DC
Start: 2015-12-10 — End: 2016-01-02

## 2015-12-10 NOTE — Telephone Encounter (Signed)
Patient bought in IXL Questionnaire. Form in box in office

## 2015-12-10 NOTE — Addendum Note (Signed)
Addended by: Dereck Ligas on: 12/10/2015 04:12 PM   Modules accepted: Orders

## 2015-12-10 NOTE — Telephone Encounter (Signed)
Notified patient he did have a sleep study back in 2014. It showed mild sleep apnea but not severe enough to use CPAP machine. His [place] questionnaire that he just recently filled out is rated as a higher risk for sleep apnea. Based on his answers we would recommend repeat sleep study. Dr. Lorin Picket recommends referral for sleep study. Patient verbalized understanding and referral was initiated in the system.

## 2015-12-10 NOTE — Telephone Encounter (Signed)
This patient did have a sleep study back in 2014. It showed mild sleep apnea but not severe enough to use CPAP machine. His [place] questionnaire that he just recently filled out is rated as a higher risk for sleep apnea. Based on his answers we would recommend repeat sleep study. I recommend referral for sleep study.

## 2015-12-10 NOTE — Progress Notes (Signed)
   Subjective:    Patient ID: DAKSH COATES, male    DOB: [birth year], 55 y.o.   MRN: [id number]  Cough This is a new problem. The current episode started in the past 7 days. The cough is non-productive. Associated symptoms include a fever, headaches, myalgias, rhinorrhea, a sore throat and shortness of breath. Treatments tried: OTC decongestants     Patient states no other concern this visit.  zat eve.  Felt bad and achey, diffuse bad headache  Severe pain and achiness, felt bad, felt bad  Non smoker      Review of Systems  Constitutional: Positive for fever.  HENT: Positive for rhinorrhea and sore throat.   Respiratory: Positive for cough and shortness of breath.   Musculoskeletal: Positive for myalgias.  Neurological: Positive for headaches.       Objective:   Physical Exam  Alert moderate malaise. H&T moderate nasal congestion pharynx normal neck supple lungs no wheezes positive bronchial cough heart regular in rhythm      Assessment & Plan:  Impression rhinosinusitis/bronchitis post flu plan antibiotics prescribed. Symptomatic care discussed warning signs discussed WSL

## 2016-01-02 ENCOUNTER — Ambulatory Visit (INDEPENDENT_AMBULATORY_CARE_PROVIDER_SITE_OTHER): Payer: Managed Care, Other (non HMO) | Admitting: Internal Medicine

## 2016-01-02 ENCOUNTER — Encounter (INDEPENDENT_AMBULATORY_CARE_PROVIDER_SITE_OTHER): Payer: Self-pay | Admitting: Internal Medicine

## 2016-01-02 VITALS — BP 134/86 | HR 60 | Temp 98.1°F | Ht 70.0 in | Wt 233.3 lb

## 2016-01-02 DIAGNOSIS — D509 Iron deficiency anemia, unspecified: Secondary | ICD-10-CM | POA: Diagnosis not present

## 2016-01-02 LAB — CBC WITH DIFFERENTIAL/PLATELET
BASOS ABS: 0 10*3/uL (ref 0.0–0.1)
BASOS PCT: 0 % (ref 0–1)
EOS ABS: 0.2 10*3/uL (ref 0.0–0.7)
EOS PCT: 4 % (ref 0–5)
HCT: 39.8 % (ref 39.0–52.0)
Hemoglobin: 13.4 g/dL (ref 13.0–17.0)
LYMPHS PCT: 43 % (ref 12–46)
Lymphs Abs: 2.1 10*3/uL (ref 0.7–4.0)
MCH: 29.4 pg (ref 26.0–34.0)
MCHC: 33.7 g/dL (ref 30.0–36.0)
MCV: 87.3 fL (ref 78.0–100.0)
MPV: 8.9 fL (ref 8.6–12.4)
Monocytes Absolute: 0.5 10*3/uL (ref 0.1–1.0)
Monocytes Relative: 11 % (ref 3–12)
Neutro Abs: 2 10*3/uL (ref 1.7–7.7)
Neutrophils Relative %: 42 % — ABNORMAL LOW (ref 43–77)
PLATELETS: 284 10*3/uL (ref 150–400)
RBC: 4.56 MIL/uL (ref 4.22–5.81)
RDW: 17.1 % — AB (ref 11.5–15.5)
WBC: 4.8 10*3/uL (ref 4.0–10.5)

## 2016-01-02 LAB — IRON AND TIBC
%SAT: 41 % (ref 15–60)
Iron: 183 ug/dL — ABNORMAL HIGH (ref 50–180)
TIBC: 449 ug/dL — ABNORMAL HIGH (ref 250–425)
UIBC: 266 ug/dL (ref 125–400)

## 2016-01-02 LAB — FERRITIN: FERRITIN: 26 ng/mL (ref 20–380)

## 2016-01-02 NOTE — Patient Instructions (Addendum)
3 stool cards home with patient. Iron studies and CBC OV pending Stool softener.

## 2016-01-02 NOTE — Progress Notes (Signed)
Subjective:    Patient ID: Jacob Hanna, male    DOB: May 19, 1961, 55 y.o.   MRN: [id number]  HPI Referred by Dr. Gerda Diss for positive stool card. Hx of IDA and heme positive stool. He underwent a colonoscopy, EGD, and Capsule study in 2013 for this.   See below. He recently started iron about a month ago. He is doing Iron two a day.  He thinks in the past 4 months he saw blood x 1 when his stool was hard and had to strain.  Noted recently his ferritin was 9.  His appetite is good. No weight loss. He takes Motrin about 2 times a month for a headache. Has been taking iron for about a month.  He tells me as a child he had anemia  12/02/2015 H and H 13.2 and 38.9, MCV 86, ferritin 9.      09/07/2012 Small bowel study Given Capsule Indication for procedure: Patient is 55 year old Caucasian male who was evaluated in August 2013 for iron deficiency anemia and heme-positive stool. He underwent EGD and colonoscopy. He had moderate size heart hernia with erosions and duodenitis and colonoscopy was unremarkable. He was seen in the office recently and noted to have heme positive stool and his serum ferritin was still low at 16 but his hemoglobin has normalized to 14.3. Since he is still losing blood from his GI tract he is undergoing small bowel study to complete his workup. Patient states he takes OTC NSAIDs sporadically  Summary & Recommendations: Two small punctate telangiectasia noted at jejunum without stigmata of bleed. Focal mucosal abnormality at distal ileum possibly healing ulcer related to NSAID use. Findings are reviewed with patient over the phone. He was advised to keep OTC NSAIDs used to minimum. He'll have H&H and serum ferritin in 8 weeks.     07/01/2012 Colonoscopy followed by esophagogastroduodenoscopy.  Indications: Patient is 55 year old Caucasian male who was recently found to have iron deficiency anemia. He does give history of intermittent constipation and  hematochezia. When he was seen in the office recently as stool was noted to be heme positive. He does take Naprosyn and/or Advil 2-3 times a week. He denies frank rectal bleeding melena abdominal pain frequent heartburn nausea vomiting or dysphagia. Family history is negative for colorectal carcinoma. He will undergo colonoscopy to be followed by EGD if colonoscopy is negative. Normal colonoscopy except small external hemorrhoids. Mild changes of reflux esophagitis limited to GE junction. Moderate size hiatal hernia with erosions involving herniated part of the stomach and at the level of hiatus. Incidental finding of 5 mm submucosal nodule at antrum possibly a small lipoma. Bulbar duodenitis. It is possible that he has been losing blood from his small bowel secondary to NSAID injury.   Review of Systems Past Medical History  Diagnosis Date  . Anemia   . Low HDL (under 40)   . Elevated hemoglobin A1c     Past Surgical History  Procedure Laterality Date  . Givens capsule study  09/07/2012    Procedure: GIVENS CAPSULE STUDY;  Surgeon: Malissa Hippo, MD;  Location: [facility];  Service: Endoscopy;  Laterality: N/A;  730  . Colonoscopy    . Esophagogastroduodenoscopy    . Knee arthroscopy with medial menisectomy Left 01/31/2013    Procedure: KNEE ARTHROSCOPY WITH PARTIAL  MEDIAL MENISECTOMY;  Surgeon: Vickki Hearing, MD;  Location: [facility];  Service: Orthopedics;  Laterality: Left;  . Knee arthroscopy with excision plica Left 01/31/2013  Procedure: KNEE ARTHROSCOPY WITH EXCISION PLICA;  Surgeon: Vickki Hearing, MD;  Location: [facility];  Service: Orthopedics;  Laterality: Left;  . Knee surgery    . Knee arthroscopy with medial menisectomy Left 04/14/2013    Procedure: KNEE ARTHROSCOPY WITH PARTIAL MEDIAL MENISECTOMY;  Surgeon: Vickki Hearing, MD;  Location: [facility];  Service:  Orthopedics;  Laterality: Left;  Partial medial menisectomy  . Chondroplasty Left 04/14/2013    Procedure: CHONDROPLASTY;  Surgeon: Vickki Hearing, MD;  Location: [facility];  Service: Orthopedics;  Laterality: Left;    No Known Allergies  No current outpatient prescriptions on file prior to visit.   No current facility-administered medications on file prior to visit.        Objective:   Physical Exam Blood pressure 134/86, pulse 60, temperature 98.1 F (36.7 C), height  (1.778 m), weight 233 lb 4.8 oz (105.824 kg).  Alert and oriented. Skin warm and dry. Oral mucosa is moist.   . Sclera anicteric, conjunctivae is pink. Thyroid not enlarged. No cervical lymphadenopathy. Lungs clear. Heart regular rate and rhythm.  Abdomen is soft. Bowel sounds are positive. No hepatomegaly. No abdominal masses felt. No tenderness.  No edema to lower extremities.        Assessment & Plan:  IDA. Heme positive stool x 1. Am going to send 3 stool cards home with him.  CBC, Ferritin, Iron, TIBC today . I discussed with Dr. Karilyn Cota. Will decide once we have lab back.

## 2016-01-07 ENCOUNTER — Other Ambulatory Visit (INDEPENDENT_AMBULATORY_CARE_PROVIDER_SITE_OTHER): Payer: Self-pay | Admitting: Internal Medicine

## 2016-01-07 ENCOUNTER — Telehealth (INDEPENDENT_AMBULATORY_CARE_PROVIDER_SITE_OTHER): Payer: Self-pay | Admitting: Internal Medicine

## 2016-01-07 DIAGNOSIS — D62 Acute posthemorrhagic anemia: Secondary | ICD-10-CM

## 2016-01-07 DIAGNOSIS — R195 Other fecal abnormalities: Secondary | ICD-10-CM

## 2016-01-07 NOTE — Telephone Encounter (Signed)
Jacob Hanna, I have spoken with patient. He needs capsule study. (I have not put order in )

## 2016-01-10 NOTE — Telephone Encounter (Signed)
I have left several messages for patient to call me to schedule GIVENS, he hasn't returned any calls

## 2016-01-17 ENCOUNTER — Telehealth (INDEPENDENT_AMBULATORY_CARE_PROVIDER_SITE_OTHER): Payer: Self-pay | Admitting: *Deleted

## 2016-01-17 NOTE — Telephone Encounter (Signed)
   Diagnosis:    Result(s)   Card 1: Positive    Card 2: Positive   Card 3: Positive    Completed by:    HEMOCCULT SENSA DEVELOPER: LOT#:  [id number] EXPIRATION DATE: 9-17   HEMOCCULT SENSA CARD:  LOT#:  02/14 EXPIRATION DATE: 07/18   CARD CONTROL RESULTS:  POSITIVE: Positive NEGATIVE: Negative    ADDITIONAL COMMENTS: Forwarded to Terri for review.

## 2016-01-23 NOTE — Telephone Encounter (Signed)
Message left at home 

## 2016-01-23 NOTE — Telephone Encounter (Signed)
No answer at home #

## 2016-05-18 ENCOUNTER — Telehealth: Payer: Self-pay | Admitting: Family Medicine

## 2016-05-18 DIAGNOSIS — D649 Anemia, unspecified: Secondary | ICD-10-CM

## 2016-05-18 DIAGNOSIS — Z125 Encounter for screening for malignant neoplasm of prostate: Secondary | ICD-10-CM

## 2016-05-18 DIAGNOSIS — E785 Hyperlipidemia, unspecified: Secondary | ICD-10-CM

## 2016-05-18 NOTE — Telephone Encounter (Signed)
bw orders for wellness   Last labs 2/23 CBC, Ferritin, TIBC, Iron 12/02/15 CBC, Ferritin, TSH, T4 Free 05/17/15  PSA, A1C

## 2016-05-18 NOTE — Telephone Encounter (Signed)
Met 7, lipid, liver, PSA, CBC, TIBC, ferritin-iron deficient anemia, hyperlipidemia, screening prostate

## 2016-05-18 NOTE — Telephone Encounter (Signed)
Spoke with patient and informed him per Dr.Scott Luking- labs were ordered for upcoming appointment. Patient verbalized understanding.  

## 2016-05-28 LAB — HEPATIC FUNCTION PANEL
ALBUMIN: 4.5 g/dL (ref 3.5–5.5)
ALT: 9 IU/L (ref 0–44)
AST: 18 IU/L (ref 0–40)
Alkaline Phosphatase: 131 IU/L — ABNORMAL HIGH (ref 39–117)
BILIRUBIN TOTAL: 0.2 mg/dL (ref 0.0–1.2)
Bilirubin, Direct: 0.08 mg/dL (ref 0.00–0.40)
TOTAL PROTEIN: 6.9 g/dL (ref 6.0–8.5)

## 2016-05-28 LAB — IRON AND TIBC
IRON: 24 ug/dL — AB (ref 38–169)
Iron Saturation: 5 % — CL (ref 15–55)
Total Iron Binding Capacity: 495 ug/dL — ABNORMAL HIGH (ref 250–450)
UIBC: 471 ug/dL — ABNORMAL HIGH (ref 111–343)

## 2016-05-28 LAB — CBC WITH DIFFERENTIAL/PLATELET
BASOS: 0 %
Basophils Absolute: 0 10*3/uL (ref 0.0–0.2)
EOS (ABSOLUTE): 0.2 10*3/uL (ref 0.0–0.4)
EOS: 5 %
HEMATOCRIT: 33.7 % — AB (ref 37.5–51.0)
Hemoglobin: 10.9 g/dL — ABNORMAL LOW (ref 12.6–17.7)
IMMATURE GRANULOCYTES: 0 %
Immature Grans (Abs): 0 10*3/uL (ref 0.0–0.1)
LYMPHS ABS: 2 10*3/uL (ref 0.7–3.1)
Lymphs: 45 %
MCH: 28.2 pg (ref 26.6–33.0)
MCHC: 32.3 g/dL (ref 31.5–35.7)
MCV: 87 fL (ref 79–97)
MONOS ABS: 0.5 10*3/uL (ref 0.1–0.9)
Monocytes: 10 %
NEUTROS ABS: 1.8 10*3/uL (ref 1.4–7.0)
NEUTROS PCT: 40 %
Platelets: 387 10*3/uL — ABNORMAL HIGH (ref 150–379)
RBC: 3.87 x10E6/uL — ABNORMAL LOW (ref 4.14–5.80)
RDW: 14.5 % (ref 12.3–15.4)
WBC: 4.5 10*3/uL (ref 3.4–10.8)

## 2016-05-28 LAB — BASIC METABOLIC PANEL
BUN / CREAT RATIO: 13 (ref 9–20)
BUN: 12 mg/dL (ref 6–24)
CO2: 24 mmol/L (ref 18–29)
CREATININE: 0.96 mg/dL (ref 0.76–1.27)
Calcium: 9.3 mg/dL (ref 8.7–10.2)
Chloride: 102 mmol/L (ref 96–106)
GFR calc non Af Amer: 90 mL/min/{1.73_m2} (ref >59)
GFR, EST AFRICAN AMERICAN: 104 mL/min/{1.73_m2} (ref >59)
GLUCOSE: 92 mg/dL (ref 65–99)
Potassium: 4.9 mmol/L (ref 3.5–5.2)
SODIUM: 141 mmol/L (ref 134–144)

## 2016-05-28 LAB — LIPID PANEL
CHOLESTEROL TOTAL: 162 mg/dL (ref 100–199)
Chol/HDL Ratio: 4 ratio units (ref 0.0–5.0)
HDL: 41 mg/dL (ref >39)
LDL CALC: 103 mg/dL — AB (ref 0–99)
Triglycerides: 91 mg/dL (ref 0–149)
VLDL Cholesterol Cal: 18 mg/dL (ref 5–40)

## 2016-05-28 LAB — PSA: Prostate Specific Ag, Serum: 0.7 ng/mL (ref 0.0–4.0)

## 2016-06-04 ENCOUNTER — Encounter: Payer: Self-pay | Admitting: Family Medicine

## 2016-06-04 ENCOUNTER — Ambulatory Visit (INDEPENDENT_AMBULATORY_CARE_PROVIDER_SITE_OTHER): Payer: Managed Care, Other (non HMO) | Admitting: Family Medicine

## 2016-06-04 VITALS — BP 136/82 | Ht 70.0 in | Wt 236.2 lb

## 2016-06-04 DIAGNOSIS — Z0000 Encounter for general adult medical examination without abnormal findings: Secondary | ICD-10-CM

## 2016-06-04 DIAGNOSIS — D509 Iron deficiency anemia, unspecified: Secondary | ICD-10-CM | POA: Diagnosis not present

## 2016-06-04 NOTE — Progress Notes (Signed)
   Subjective:    Patient ID: Jacob Hanna, male    DOB: 1961-09-06, 55 y.o.   MRN: [id number]  HPI  The patient comes in today for a wellness visit.    A review of their health history was completed.  A review of medications was also completed.  Any needed refills; no  Eating habits: trying to eat healthy  Falls/  MVA accidents in past few months: none  Regular exercise: try  Specialist pt sees on regular basis: no  Preventative health issues were discussed.   Additional concerns: needs for filled out for work This patient states he is trying to eat healthy is trying to stay physically active denies any sweats chills recent lab work shows improvement on his cholesterol sugar looks good but does show anemia. This will need to have Hemoccult cards. The patient denies seeing any blood in his stools. He did have a workup several years ago including EGD colonoscopy and capsule. 2 separate times over the past year is stool shown positive for blood and now his hemoglobin is low and he has low energy Review of Systems  Constitutional: Negative for activity change, appetite change and fever.  HENT: Negative for congestion and rhinorrhea.   Eyes: Negative for discharge.  Respiratory: Negative for cough and wheezing.   Cardiovascular: Negative for chest pain.  Gastrointestinal: Negative for abdominal pain, blood in stool and vomiting.  Genitourinary: Negative for difficulty urinating and frequency.  Musculoskeletal: Negative for neck pain.  Skin: Negative for rash.  Allergic/Immunologic: Negative for environmental allergies and food allergies.  Neurological: Negative for weakness and headaches.  Psychiatric/Behavioral: Negative for agitation.       Objective:   Physical Exam  Constitutional: He appears well-developed and well-nourished.  HENT:  Head: Normocephalic and atraumatic.  Right Ear: External ear normal.  Left Ear: External ear normal.  Nose: Nose normal.    Mouth/Throat: Oropharynx is clear and moist.  Eyes: EOM are normal. Pupils are equal, round, and reactive to light.  Neck: Normal range of motion. Neck supple. No thyromegaly present.  Cardiovascular: Normal rate, regular rhythm and normal heart sounds.   No murmur heard. Pulmonary/Chest: Effort normal and breath sounds normal. No respiratory distress. He has no wheezes.  Abdominal: Soft. Bowel sounds are normal. He exhibits no distension and no mass. There is no tenderness.  Genitourinary: Prostate normal and penis normal.  Musculoskeletal: Normal range of motion. He exhibits no edema.  Lymphadenopathy:    He has no cervical adenopathy.  Neurological: He is alert. He exhibits normal muscle tone.  Skin: Skin is warm and dry. No erythema.  Psychiatric: He has a normal mood and affect. His behavior is normal. Judgment normal.          Assessment & Plan:  Adult wellness-complete.wellness physical was conducted today. Importance of diet and exercise were discussed in detail. In addition to this a discussion regarding safety was also covered. We also reviewed over immunizations and gave recommendations regarding current immunization needed for age. In addition to this additional areas were also touched on including: Preventative health exams needed: Colonoscopy His last colonoscopy 2013 We will go ahead and do Hemoccult cards Ion deficient anemia We will send copies of his office note back to D Rehman I believe the patient may well need to have another colonoscopy or EGD because of his hemoglobin being low  Patient was advised yearly wellness exam

## 2016-06-09 ENCOUNTER — Other Ambulatory Visit: Payer: Self-pay | Admitting: *Deleted

## 2016-06-09 DIAGNOSIS — D509 Iron deficiency anemia, unspecified: Secondary | ICD-10-CM

## 2016-06-09 LAB — POC HEMOCCULT BLD/STL (HOME/3-CARD/SCREEN)
Card #2 Fecal Occult Blod, POC: NEGATIVE
FECAL OCCULT BLD: NEGATIVE
FECAL OCCULT BLD: NEGATIVE

## 2016-06-09 NOTE — Progress Notes (Signed)
Needs OV.  

## 2016-06-11 NOTE — Progress Notes (Signed)
Patient was offered an appointment for today or tomorrow, but he chose to come in on Monday, 06/15/16 at 1:45pm.  He'll be seeing Terri Setzer, NP. °

## 2016-06-11 NOTE — Progress Notes (Signed)
Patient was offered an appointment for today or tomorrow, but he chose to come in on Monday, 06/15/16 at 1:45pm.  He'll be seeing Dorene Ar, NP.

## 2016-06-15 ENCOUNTER — Encounter (INDEPENDENT_AMBULATORY_CARE_PROVIDER_SITE_OTHER): Payer: Self-pay | Admitting: Internal Medicine

## 2016-06-15 ENCOUNTER — Other Ambulatory Visit (INDEPENDENT_AMBULATORY_CARE_PROVIDER_SITE_OTHER): Payer: Self-pay | Admitting: Internal Medicine

## 2016-06-15 ENCOUNTER — Encounter (INDEPENDENT_AMBULATORY_CARE_PROVIDER_SITE_OTHER): Payer: Self-pay | Admitting: *Deleted

## 2016-06-15 ENCOUNTER — Ambulatory Visit (INDEPENDENT_AMBULATORY_CARE_PROVIDER_SITE_OTHER): Payer: Managed Care, Other (non HMO) | Admitting: Internal Medicine

## 2016-06-15 VITALS — BP 142/90 | HR 72 | Temp 98.2°F | Ht 70.0 in | Wt 235.9 lb

## 2016-06-15 DIAGNOSIS — D509 Iron deficiency anemia, unspecified: Secondary | ICD-10-CM | POA: Insufficient documentation

## 2016-06-15 DIAGNOSIS — R195 Other fecal abnormalities: Secondary | ICD-10-CM

## 2016-06-15 LAB — CBC WITH DIFFERENTIAL/PLATELET
BASOS PCT: 1 %
Basophils Absolute: 50 cells/uL (ref 0–200)
EOS PCT: 6 %
Eosinophils Absolute: 300 cells/uL (ref 15–500)
HEMATOCRIT: 35.2 % — AB (ref 38.5–50.0)
HEMOGLOBIN: 11.4 g/dL — AB (ref 13.2–17.1)
LYMPHS ABS: 2100 {cells}/uL (ref 850–3900)
LYMPHS PCT: 42 %
MCH: 27.8 pg (ref 27.0–33.0)
MCHC: 32.4 g/dL (ref 32.0–36.0)
MCV: 85.9 fL (ref 80.0–100.0)
MONO ABS: 450 {cells}/uL (ref 200–950)
MPV: 8.9 fL (ref 7.5–12.5)
Monocytes Relative: 9 %
NEUTROS PCT: 42 %
Neutro Abs: 2100 cells/uL (ref 1500–7800)
Platelets: 344 10*3/uL (ref 140–400)
RBC: 4.1 MIL/uL — AB (ref 4.20–5.80)
RDW: 14.8 % (ref 11.0–15.0)
WBC: 5 10*3/uL (ref 3.8–10.8)

## 2016-06-15 NOTE — Progress Notes (Signed)
Subjective:    Patient ID: Jacob Hanna, male    DOB: August 12, 1961, 55 y.o.   MRN: [id number]   HPI Here for f/u. Patient Dr. Gerda Diss. Saw Dr. Gerda Diss last month and noted his hemoglobin was 10.9. Three stool cards were negative.  Last OV with me, his stool cards were positive. Hx of IDA.   He denies seeing any blood. Stools are brown in color. He does say if he has a hard stool, he sometimes see blood.  Appetite is good. No weight loss. No abdominal pain . He rarely takes ASA.   He underwent a colonoscopy, EGD, and Capsule study in 2013 for same. 05/27/2016 Hemoglobin 10.9.   7.19 TIBC 495, Iron 24, Iron sat 5,   CBC Latest Ref Rng & Units 05/27/2016 01/02/2016 12/02/2015  WBC 3.4 - 10.8 x10E3/uL 4.5 4.8 5.3  Hemoglobin 13.0 - 17.0 g/dL - 40.9 -  Hematocrit 81.1 - 51.0 % 33.7(L) 39.8 38.9  Platelets 150 - 379 x10E3/uL 387(H) 284 328      09/07/2012 Small bowel study Given Capsule Indication for procedure: Patient is 55 year old Caucasian male who was evaluated in August 2013 for iron deficiency anemia and heme-positive stool. He underwent EGD and colonoscopy. He had moderate size heart hernia with erosions and duodenitis and colonoscopy was unremarkable. He was seen in the office recently and noted to have heme positive stool and his serum ferritin was still low at 16 but his hemoglobin has normalized to 14.3. Since he is still losing blood from his GI tract he is undergoing small bowel study to complete his workup. Patient states he takes OTC NSAIDs sporadically  Summary & Recommendations: Two small punctate telangiectasia noted at jejunum without stigmata of bleed. Focal mucosal abnormality at distal ileum possibly healing ulcer related to NSAID use. Findings are reviewed with patient over the phone. He was advised to keep OTC NSAIDs used to minimum. He'll have H&H and serum ferritin in 8 weeks.     07/01/2012 Colonoscopy followed by  esophagogastroduodenoscopy.  Indications: Patient is 55 year old Caucasian male who was recently found to have iron deficiency anemia. He does give history of intermittent constipation and hematochezia. When he was seen in the office recently as stool was noted to be heme positive. He does take Naprosyn and/or Advil 2-3 times a week. He denies frank rectal bleeding melena abdominal pain frequent heartburn nausea vomiting or dysphagia. Family history is negative for colorectal carcinoma. He will undergo colonoscopy to be followed by EGD if colonoscopy is negative. Normal colonoscopy except small external hemorrhoids. Mild changes of reflux esophagitis limited to GE junction. Moderate size hiatal hernia with erosions involving herniated part of the stomach and at the level of hiatus. Incidental finding of 5 mm submucosal nodule at antrum possibly a small lipoma. Bulbar duodenitis. It is possible that he has been losing blood from his small bowel secondary to NSAID injury.   Review of Systems Past Medical History:  Diagnosis Date  . Anemia   . Elevated hemoglobin A1c   . Low HDL (under 40)     Past Surgical History:  Procedure Laterality Date  . CHONDROPLASTY Left 04/14/2013   Procedure: CHONDROPLASTY;  Surgeon: Vickki Hearing, MD;  Location: [facility];  Service: Orthopedics;  Laterality: Left;  . COLONOSCOPY    . ESOPHAGOGASTRODUODENOSCOPY    . GIVENS CAPSULE STUDY  09/07/2012   Procedure: GIVENS CAPSULE STUDY;  Surgeon: Malissa Hippo, MD;  [facility];  Service: Endoscopy;  Laterality: N/A;  730  . KNEE ARTHROSCOPY WITH EXCISION PLICA Left 01/31/2013   Procedure: KNEE ARTHROSCOPY WITH EXCISION PLICA;  Surgeon: Vickki HearingStanley E Harrison, MD;  Location: [facility];  Service: Orthopedics;  Laterality: Left;  . KNEE ARTHROSCOPY WITH MEDIAL MENISECTOMY Left 01/31/2013   Procedure: KNEE  ARTHROSCOPY WITH PARTIAL  MEDIAL MENISECTOMY;  Surgeon: Vickki HearingStanley E Harrison, MD;  Location: [facility];  Service: Orthopedics;  Laterality: Left;  . KNEE ARTHROSCOPY WITH MEDIAL MENISECTOMY Left 04/14/2013   Procedure: KNEE ARTHROSCOPY WITH PARTIAL MEDIAL MENISECTOMY;  Surgeon: Vickki HearingStanley E Harrison, MD;  Location: [facility];  Service: Orthopedics;  Laterality: Left;  Partial medial menisectomy  . KNEE SURGERY      No Known Allergies  Current Outpatient Prescriptions on File Prior to Visit  Medication Sig Dispense Refill  . Cinnamon 500 MG capsule Take 1,000 mg by mouth 2 (two) times daily.    . cyanocobalamin 100 MCG tablet Take 100 mcg by mouth daily.    . ferrous sulfate 325 (65 FE) MG tablet Take 325 mg by mouth 2 (two) times daily with a meal.     No current facility-administered medications on file prior to visit.        Objective:   Physical Exam Blood pressure (!) 142/90, pulse 72, temperature 98.2 F (36.8 C), height 5\' 10"  (1.778 m), weight 235 lb 14.4 oz (107 kg).  Alert and oriented. Skin warm and dry. Oral mucosa is moist.   . Sclera anicteric, conjunctivae is pink. Thyroid not enlarged. No cervical lymphadenopathy. Lungs clear. Heart regular rate and rhythm.  Abdomen is soft. Bowel sounds are positive. No hepatomegaly. No abdominal masses felt. No tenderness.  No edema to lower extremities.  . Stool brown and guaiac positive.      Assessment & Plan:  Guaiac positive stool. IDA. Needs Given's capsule. Colonic ulcer needs to be ruled out. ( I discussed this case with Dr. Karilyn Cotaehman in April of this year. We were unable to reach patient for the procedure.)

## 2016-06-15 NOTE — Patient Instructions (Signed)
CBC, ferritin today 

## 2016-06-16 LAB — FERRITIN: FERRITIN: 7 ng/mL — AB (ref 20–380)

## 2016-06-23 ENCOUNTER — Ambulatory Visit (HOSPITAL_COMMUNITY)
Admission: RE | Admit: 2016-06-23 | Discharge: 2016-06-23 | Disposition: A | Discharge status: Home / Self Care | Payer: Managed Care, Other (non HMO) | Source: Ambulatory Visit | Attending: Internal Medicine | Admitting: Internal Medicine

## 2016-06-23 ENCOUNTER — Encounter (HOSPITAL_COMMUNITY)
Admission: RE | Disposition: A | Discharge status: Home / Self Care | Payer: Self-pay | Source: Ambulatory Visit | Attending: Internal Medicine

## 2016-06-23 DIAGNOSIS — D500 Iron deficiency anemia secondary to blood loss (chronic): Secondary | ICD-10-CM | POA: Insufficient documentation

## 2016-06-23 DIAGNOSIS — D509 Iron deficiency anemia, unspecified: Secondary | ICD-10-CM | POA: Insufficient documentation

## 2016-06-23 HISTORY — PX: GIVENS CAPSULE STUDY: SHX5432

## 2016-06-23 SURGERY — IMAGING PROCEDURE, GI TRACT, INTRALUMINAL, VIA CAPSULE

## 2016-06-23 NOTE — H&P (Signed)
Jacob Hanna is an 55 y.o. male.   Chief Complaint: Patient is here for small bowel given capsule study. HPI: Patient is 55 year old Caucasian male who presents with iron deficiency anemia and heme positive stool. He has history of same for which is evaluated in 2013. 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 He does not have any symptoms pertaining to upper or lower GI tract. Colonoscopy in August 2013 was unremarkable. EGD did reveal moderate-sized sliding hiatal hernia. I feel he should be further evaluated with small bowel study which may also provide opportunity to evaluate upper GI tract. Patient does not take OTC NSAIDs.  Past Medical History:  Diagnosis Date  . Anemia   . Elevated hemoglobin A1c   . Low HDL (under 40)     Past Surgical History:  Procedure Laterality Date  . CHONDROPLASTY Left 04/14/2013   Procedure: CHONDROPLASTY;  Surgeon: Vickki HearingStanley E Harrison, MD;  Location: [facility];  Service: Orthopedics;  Laterality: Left;  . COLONOSCOPY    . ESOPHAGOGASTRODUODENOSCOPY    . GIVENS CAPSULE STUDY  09/07/2012   Procedure: GIVENS CAPSULE STUDY;  Surgeon: Malissa HippoNajeeb U Rehman, MD;  [facility];  Service: Endoscopy;  Laterality: N/A;  730  . KNEE ARTHROSCOPY WITH EXCISION PLICA Left 01/31/2013   Procedure: KNEE ARTHROSCOPY WITH EXCISION PLICA;  Surgeon: Vickki HearingStanley E Harrison, MD;  Location: [facility];  Service: Orthopedics;  Laterality: Left;  . KNEE ARTHROSCOPY WITH MEDIAL MENISECTOMY Left 01/31/2013   Procedure: KNEE ARTHROSCOPY WITH PARTIAL  MEDIAL MENISECTOMY;  Surgeon: Vickki HearingStanley E Harrison, MD;  Location: [facility];  Service: Orthopedics;  Laterality: Left;  . KNEE ARTHROSCOPY WITH MEDIAL MENISECTOMY Left 04/14/2013   Procedure: KNEE ARTHROSCOPY WITH PARTIAL MEDIAL MENISECTOMY;  Surgeon: Vickki HearingStanley E Harrison, MD;  Location: [facility];  Service: Orthopedics;  Laterality: Left;  Partial medial menisectomy  . KNEE SURGERY      Family History  Problem Relation  Age of Onset  . Heart attack Father     Pacemaker  . Diabetes Sister   . Diabetes Sister    Social History:  reports that he has never smoked. He has never used smokeless tobacco. He reports that he does not drink alcohol or use drugs.  Allergies: No Known Allergies  No prescriptions prior to admission.    No results found for this or any previous visit (from the past 48 hour(s)). No results found.  ROS  There were no vitals taken for this visit. Physical Exam   Assessment/Plan  Iron deficiency anemia secondary to chronic GI bleed possibly from small bowel source based on prior evaluation of 2013.  Lionel DecemberNajeeb Rehman, MD 06/23/2016, 3:03 PM

## 2016-06-24 ENCOUNTER — Encounter (HOSPITAL_COMMUNITY): Payer: Self-pay | Admitting: Internal Medicine

## 2016-06-26 NOTE — Op Note (Signed)
Small Bowel Givens Capsule Study Procedure date:  06/23/2016  Referring Provider: Lilyan PuntScott Luking, MD PCP:  Dr. Lilyan PuntScott Luking, MD  Indication for procedure:   Patient is 55 year old Caucasian male who presents with iron deficiency anemia and heme positive stool. He was last evaluated 4 years ago for iron deficiency anemia and heme positive stool. He underwent colonoscopy for by EGD as well as small bowel given capsule study. It was felt that he bled from a small bowel. I therefore recommended small bowel given capsule study to further evaluate his condition. He has no symptoms pertaining to upper or lower GI tract that would warrant repeat EGD and colonoscopy. He has been off iron for about 10 days.     Findings:  Patient was able to swallow given capsule without any difficulty. 2 small AV malformations noted without stigmata of bleed. First one was noted at 00:37:00 and the second one at 03:05:30. Submucosal abnormality noted in distal small bowel either extrinsic or secondary to submucosal cyst or lipoma. Overlying mucosa appeared to be normal. Best seen on image at 03:52:52. 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 Submucosal abnormality noted in distal small bowel which could be secondary to cyst or submucosal lipoma or an intrinsic process. Patient advised to resume ferrous sulfate. Repeat H&H and serum ferritin in three months. Hold off small bowel barium study per patient request. Patient will call office if he has frank rectal bleeding or melena.

## 2016-12-04 ENCOUNTER — Ambulatory Visit: Payer: Managed Care, Other (non HMO) | Admitting: Family Medicine

## 2017-05-24 ENCOUNTER — Encounter: Payer: Self-pay | Admitting: Family Medicine

## 2017-05-24 DIAGNOSIS — E785 Hyperlipidemia, unspecified: Secondary | ICD-10-CM

## 2017-05-24 DIAGNOSIS — Z125 Encounter for screening for malignant neoplasm of prostate: Secondary | ICD-10-CM

## 2017-05-24 DIAGNOSIS — R7303 Prediabetes: Secondary | ICD-10-CM

## 2017-05-24 DIAGNOSIS — Z79899 Other long term (current) drug therapy: Secondary | ICD-10-CM

## 2017-05-24 NOTE — Telephone Encounter (Signed)
Patient will need lipid, liver, metabolic 7, PSA-see his my chart message please

## 2017-05-25 NOTE — Addendum Note (Signed)
Addended by: Margaretha SheffieldBROWN, AUTUMN S on: 05/25/2017 08:32 AM   Modules accepted: Orders

## 2017-06-03 LAB — HEPATIC FUNCTION PANEL
ALBUMIN: 4.5 g/dL (ref 3.5–5.5)
ALK PHOS: 122 IU/L — AB (ref 39–117)
ALT: 16 IU/L (ref 0–44)
AST: 25 IU/L (ref 0–40)
BILIRUBIN TOTAL: 0.3 mg/dL (ref 0.0–1.2)
Bilirubin, Direct: 0.09 mg/dL (ref 0.00–0.40)
Total Protein: 6.7 g/dL (ref 6.0–8.5)

## 2017-06-03 LAB — BASIC METABOLIC PANEL
BUN/Creatinine Ratio: 11 (ref 9–20)
BUN: 11 mg/dL (ref 6–24)
CO2: 25 mmol/L (ref 20–29)
Calcium: 9.4 mg/dL (ref 8.7–10.2)
Chloride: 102 mmol/L (ref 96–106)
Creatinine, Ser: 1.01 mg/dL (ref 0.76–1.27)
GFR calc Af Amer: 97 mL/min/{1.73_m2} (ref >59)
GFR, EST NON AFRICAN AMERICAN: 84 mL/min/{1.73_m2} (ref >59)
Glucose: 102 mg/dL — ABNORMAL HIGH (ref 65–99)
POTASSIUM: 4.8 mmol/L (ref 3.5–5.2)
SODIUM: 141 mmol/L (ref 134–144)

## 2017-06-03 LAB — LIPID PANEL
CHOLESTEROL TOTAL: 196 mg/dL (ref 100–199)
Chol/HDL Ratio: 4.8 ratio (ref 0.0–5.0)
HDL: 41 mg/dL (ref >39)
LDL Calculated: 128 mg/dL — ABNORMAL HIGH (ref 0–99)
TRIGLYCERIDES: 134 mg/dL (ref 0–149)
VLDL Cholesterol Cal: 27 mg/dL (ref 5–40)

## 2017-06-03 LAB — PSA: Prostate Specific Ag, Serum: 0.7 ng/mL (ref 0.0–4.0)

## 2017-06-10 ENCOUNTER — Encounter: Payer: Self-pay | Admitting: Family Medicine

## 2017-06-10 ENCOUNTER — Ambulatory Visit (INDEPENDENT_AMBULATORY_CARE_PROVIDER_SITE_OTHER): Payer: Managed Care, Other (non HMO) | Admitting: Family Medicine

## 2017-06-10 VITALS — BP 132/86 | HR 104 | Ht 69.25 in | Wt 236.0 lb

## 2017-06-10 DIAGNOSIS — Z0000 Encounter for general adult medical examination without abnormal findings: Secondary | ICD-10-CM | POA: Diagnosis not present

## 2017-06-10 DIAGNOSIS — D509 Iron deficiency anemia, unspecified: Secondary | ICD-10-CM

## 2017-06-10 DIAGNOSIS — R0609 Other forms of dyspnea: Secondary | ICD-10-CM | POA: Diagnosis not present

## 2017-06-10 NOTE — Progress Notes (Signed)
   Subjective:    Patient ID: Jacob Hanna, male    DOB: 07/19/1961, 56 y.o.   MRN: [id number]  HPI The patient comes in today for a wellness visit.     A review of their health history was completed.  A review of medications was also completed.  Any needed refills; not on any prescription meds  Eating habits: health conscious  Falls/  MVA accidents in past few months: none  Regular exercise: swimming, walking, yard work  Barrister's clerkpecialist pt sees on regular basis: none  Preventative health issues were discussed.   Additional concerns: shortness of breath. 02 96% Patient relates significant shortness of breath gives out easily has to catch his breath before he can do anything even with significant rest gets winded very quickly he denies chest pressure tightness or pain denies breaking a sweat he denies wheezing or cough does not smoke or drink patient was exposed to smoke as a child   Review of Systems Relates shortness of breath denies headaches fever chills denies coughing vomiting diarrhea rash denies joint pain abdominal pain and hematuria rectal bleeding    Objective:   Physical Exam Eardrums are normal throat is normal neck no masses lungs are clear no crackles heart regular abdomen soft obese GU normal prostate normal extremities no edema skin warm dry   EKG-this was run because of DOE-EKG appears normal   prostate exam normal PSA normal Assessment & Plan:  Adult wellness-complete.wellness physical was conducted today. Importance of diet and exercise were discussed in detail. In addition to this a discussion regarding safety was also covered. We also reviewed over immunizations and gave recommendations regarding current immunization needed for age. In addition to this additional areas were also touched on including: Preventative health exams needed: Colonoscopy up-to-date on colonoscopy  Patient was advised yearly wellness exam  DOE-we need to do some lab work make sure  the patient is not anemic he has a history of iron deficient anemia in addition to this if this is normal we will need to get a man with cardiology I doubt the patient has any lung cancer I'm holding off on x-ray until we systematically eliminate other options.  Out hyperlipidemia HDL acceptable LDL mildly elevated  Morbid obesity patient was encouraged to lose weight  Glucose slightly elevated

## 2017-06-11 LAB — CBC WITH DIFFERENTIAL/PLATELET
Basophils Absolute: 0 10*3/uL (ref 0.0–0.2)
Basos: 1 %
EOS (ABSOLUTE): 0.3 10*3/uL (ref 0.0–0.4)
Eos: 7 %
HEMOGLOBIN: 13.1 g/dL (ref 13.0–17.7)
Hematocrit: 39.1 % (ref 37.5–51.0)
IMMATURE GRANS (ABS): 0 10*3/uL (ref 0.0–0.1)
Immature Granulocytes: 0 %
LYMPHS: 51 %
Lymphocytes Absolute: 2.3 10*3/uL (ref 0.7–3.1)
MCH: 31.5 pg (ref 26.6–33.0)
MCHC: 33.5 g/dL (ref 31.5–35.7)
MCV: 94 fL (ref 79–97)
MONOCYTES: 10 %
Monocytes Absolute: 0.4 10*3/uL (ref 0.1–0.9)
NEUTROS ABS: 1.4 10*3/uL (ref 1.4–7.0)
NEUTROS PCT: 31 %
PLATELETS: 297 10*3/uL (ref 150–379)
RBC: 4.16 x10E6/uL (ref 4.14–5.80)
RDW: 15.8 % — ABNORMAL HIGH (ref 12.3–15.4)
WBC: 4.4 10*3/uL (ref 3.4–10.8)

## 2017-06-11 LAB — IRON AND TIBC
Iron Saturation: 17 % (ref 15–55)
Iron: 74 ug/dL (ref 38–169)
TIBC: 430 ug/dL (ref 250–450)
UIBC: 356 ug/dL — ABNORMAL HIGH (ref 111–343)

## 2017-06-11 LAB — FERRITIN: Ferritin: 25 ng/mL — ABNORMAL LOW (ref 30–400)

## 2017-06-11 NOTE — Addendum Note (Signed)
Addended by: Margaretha SheffieldBROWN, AUTUMN S on: 06/11/2017 03:51 PM   Modules accepted: Orders

## 2017-06-14 ENCOUNTER — Encounter: Payer: Self-pay | Admitting: Family Medicine

## 2017-06-14 NOTE — Telephone Encounter (Signed)
Cancel hematology consult, stick with cardiology consult, let the patient know that we canceled hematology-he should repeat lab work again within 4 months time. Also I would like for the patient to do stool tests for blood IFBOT

## 2017-06-14 NOTE — Telephone Encounter (Signed)
Discussed with pt. Pt verbalized understanding. Hematology appt canceled per brendale. ifbot test up front for pt to pickup.

## 2017-06-17 ENCOUNTER — Encounter: Payer: Self-pay | Admitting: Family Medicine

## 2017-07-13 ENCOUNTER — Encounter: Payer: Self-pay | Admitting: Cardiovascular Disease

## 2017-07-13 ENCOUNTER — Ambulatory Visit (INDEPENDENT_AMBULATORY_CARE_PROVIDER_SITE_OTHER): Payer: Managed Care, Other (non HMO) | Admitting: Cardiovascular Disease

## 2017-07-13 VITALS — BP 122/78 | HR 66 | Ht 70.0 in | Wt 237.0 lb

## 2017-07-13 DIAGNOSIS — R0609 Other forms of dyspnea: Secondary | ICD-10-CM

## 2017-07-13 NOTE — Progress Notes (Signed)
CARDIOLOGY CONSULT NOTE  Patient ID: Jacob Hanna MRN: [id number] DOB/AGE: 06/16/1961 56 y.o.  Admit date: (Not on file) Primary Physician: Babs Sciara, MD Referring Physician: S. Luking  Reason for Consultation: Exertional dyspnea  HPI: Jacob Hanna is a 56 y.o. male who is being seen today for the evaluation of exertional dyspnea at the request of Luking, Jonna Coup, MD.   He does not smoke.  Recent CBC 06/10/17: Hemoglobin 13.1. Recent basic metabolic panel 06/02/17: BUN 11, creatinine 1.  ECG at PCPs office on 06/17/17 which I personally interpreted demonstrated sinus rhythm with no ischemic ST segment or T-wave abnormalities, nor any arrhythmias.  He has noticed progressive exertional dyspnea over the past 2 years. 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 He denies a history of pulmonary disease such as asthma and COPD. He denies chest tightness. He does have associated dizziness. He denies orthopnea, leg swelling, and paroxysmal nocturnal dyspnea. Denies audible wheezing.  He occasionally has a cough which he attributes to postnasal drip and seasonal allergies.  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  He has a history of GI bleeding but no recent bleeds.  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   No Known Allergies  Current Outpatient Prescriptions  Medication Sig Dispense Refill  . Cinnamon 500 MG capsule Take 1,000 mg by mouth 2 (two) times daily.    . cyanocobalamin 100 MCG tablet Take 100 mcg by mouth daily.    . ferrous sulfate 325 (65 FE) MG tablet Take 325 mg by mouth 2 (two) times daily with a meal.     No current facility-administered medications  for this visit.     Past Medical History:  Diagnosis Date  . Anemia   . Elevated hemoglobin A1c   . Low HDL (under 40)     Past Surgical History:  Procedure Laterality Date  . CHONDROPLASTY Left 04/14/2013   Procedure: CHONDROPLASTY;  Surgeon: Vickki Hearing, MD;  Location: [facility];  Service: Orthopedics;  Laterality: Left;  . COLONOSCOPY    . ESOPHAGOGASTRODUODENOSCOPY    . GIVENS CAPSULE STUDY  09/07/2012   Procedure: GIVENS CAPSULE STUDY;  Surgeon: Malissa Hippo, MD;  [facility];  Service: Endoscopy;  Laterality: N/A;  730  . GIVENS CAPSULE STUDY N/A 06/23/2016   Procedure: GIVENS CAPSULE STUDY;  Surgeon: Malissa Hippo, MD;  [facility];  Service: Endoscopy;  Laterality: N/A;  730  . KNEE ARTHROSCOPY WITH EXCISION PLICA Left 01/31/2013   Procedure: KNEE ARTHROSCOPY WITH EXCISION PLICA;  Surgeon: Vickki Hearing, MD;  Location: [facility];  Service: Orthopedics;  Laterality: Left;  . KNEE ARTHROSCOPY WITH MEDIAL MENISECTOMY Left 01/31/2013   Procedure: KNEE ARTHROSCOPY WITH PARTIAL  MEDIAL MENISECTOMY;  Surgeon: Vickki Hearing, MD;  Location: [facility];  Service: Orthopedics;  Laterality: Left;  . KNEE ARTHROSCOPY WITH MEDIAL MENISECTOMY Left 04/14/2013   Procedure: KNEE ARTHROSCOPY WITH PARTIAL MEDIAL MENISECTOMY;  Surgeon: Vickki Hearing, MD;  Location: [facility];  Service: Orthopedics;  Laterality: Left;  Partial medial menisectomy  . KNEE SURGERY      Social History  Social History  . Marital status: Married    Spouse name: N/A  . Number of children: N/A  . Years of education: N/A   Occupational History  . Not on file.   Social History Main Topics  . Smoking status: Never Smoker  . Smokeless tobacco: Never Used  . Alcohol use No  . Drug use: No  . Sexual activity: Yes    Birth control/ protection: None   Other Topics Concern  . Not on file   Social History Narrative  . No narrative on file     No family history of premature CAD  in 1st degree relatives.  Current Meds  Medication Sig  . Cinnamon 500 MG capsule Take 1,000 mg by mouth 2 (two) times daily.  . cyanocobalamin 100 MCG tablet Take 100 mcg by mouth daily.  . ferrous sulfate 325 (65 FE) MG tablet Take 325 mg by mouth 2 (two) times daily with a meal.      Review of systems complete and found to be negative unless listed above in HPI    Physical exam Blood pressure 122/78, pulse 66, height 5\' 10"  (1.778 m), weight 237 lb (107.5 kg), SpO2 97 %. General: NAD Neck: No JVD, no thyromegaly or thyroid nodule.  Lungs: Clear to auscultation bilaterally with normal respiratory effort. CV: Nondisplaced PMI. Regular rate and rhythm, normal S1/S2, no S3/S4, no murmur.  No peripheral edema.  No carotid bruit.    Abdomen: Soft, nontender, no distention.  Skin: Intact without lesions or rashes.  Neurologic: Alert and oriented x 3.  Psych: Normal affect. Extremities: No clubbing or cyanosis.  HEENT: Normal.   ECG: Most recent ECG reviewed.   Labs: Lab Results  Component Value Date/Time   K 4.8 06/02/2017 09:33 AM   BUN 11 06/02/2017 09:33 AM   CREATININE 1.01 06/02/2017 09:33 AM   CREATININE 1.02 05/18/2014 09:02 AM   ALT 16 06/02/2017 09:33 AM   TSH 1.480 12/02/2015 09:11 AM   HGB 13.1 06/10/2017 04:00 PM     Lipids: Lab Results  Component Value Date/Time   LDLCALC 128 (H) 06/02/2017 09:33 AM   CHOL 196 06/02/2017 09:33 AM   TRIG 134 06/02/2017 09:33 AM   HDL 41 06/02/2017 09:33 AM        ASSESSMENT AND PLAN:  1. Progressive exertional dyspnea: No physical exam findings suggestive of pulmonary disease. He has not had a chest x-ray. There is no family history of premature coronary artery disease. I will proceed with a nuclear myocardial perfusion imaging study to evaluate for ischemic heart disease (exercise Myoview). I will order a 2-D echocardiogram with Doppler to evaluate cardiac structure, function, and regional wall  motion.      Disposition: Follow up in 3 weeks.  Signed: Prentice DockerSuresh Kensey Luepke, M.D., F.A.C.C.  07/13/2017, 9:03 AM

## 2017-07-13 NOTE — Patient Instructions (Signed)
Your physician recommends that you schedule a follow-up appointment in:  3 months Dr Purvis SheffieldKoneswaran    Your physician recommends that you continue on your current medications as directed. Please refer to the Current Medication list given to you today.    Your physician has requested that you have an echocardiogram. Echocardiography is a painless test that uses sound waves to create images of your heart. It provides your doctor with information about the size and shape of your heart and how well your heart's chambers and valves are working. This procedure takes approximately one hour. There are no restrictions for this procedure.     Your physician has requested that you have en exercise stress myoview. For further information please visit [email or web address]. Please follow instruction sheet, as given.       Thank you for choosing [facility] !

## 2017-07-26 ENCOUNTER — Telehealth: Payer: Self-pay

## 2017-07-26 ENCOUNTER — Encounter (HOSPITAL_BASED_OUTPATIENT_CLINIC_OR_DEPARTMENT_OTHER)
Admission: RE | Admit: 2017-07-26 | Discharge: 2017-07-26 | Disposition: A | Discharge status: Home / Self Care | Payer: Managed Care, Other (non HMO) | Source: Ambulatory Visit | Attending: Cardiovascular Disease | Admitting: Cardiovascular Disease

## 2017-07-26 ENCOUNTER — Encounter (HOSPITAL_COMMUNITY): Payer: Self-pay

## 2017-07-26 ENCOUNTER — Ambulatory Visit (HOSPITAL_BASED_OUTPATIENT_CLINIC_OR_DEPARTMENT_OTHER)
Admission: RE | Admit: 2017-07-26 | Discharge: 2017-07-26 | Disposition: A | Discharge status: Home / Self Care | Payer: Managed Care, Other (non HMO) | Source: Ambulatory Visit | Attending: Cardiovascular Disease | Admitting: Cardiovascular Disease

## 2017-07-26 ENCOUNTER — Encounter (HOSPITAL_COMMUNITY)
Admission: RE | Admit: 2017-07-26 | Discharge: 2017-07-26 | Disposition: A | Discharge status: Home / Self Care | Payer: Managed Care, Other (non HMO) | Source: Ambulatory Visit | Attending: Cardiovascular Disease | Admitting: Cardiovascular Disease

## 2017-07-26 DIAGNOSIS — R0609 Other forms of dyspnea: Secondary | ICD-10-CM

## 2017-07-26 DIAGNOSIS — E785 Hyperlipidemia, unspecified: Secondary | ICD-10-CM

## 2017-07-26 DIAGNOSIS — R7303 Prediabetes: Secondary | ICD-10-CM | POA: Insufficient documentation

## 2017-07-26 DIAGNOSIS — D649 Anemia, unspecified: Secondary | ICD-10-CM

## 2017-07-26 DIAGNOSIS — R0600 Dyspnea, unspecified: Secondary | ICD-10-CM | POA: Insufficient documentation

## 2017-07-26 DIAGNOSIS — I361 Nonrheumatic tricuspid (valve) insufficiency: Secondary | ICD-10-CM

## 2017-07-26 HISTORY — PX: CARDIOVASCULAR STRESS TEST: SHX262

## 2017-07-26 LAB — NM MYOCAR MULTI W/SPECT W/WALL MOTION / EF
CHL CUP MPHR: 166 {beats}/min
CHL CUP NUCLEAR SSS: 3
CHL CUP RESTING HR STRESS: 61 {beats}/min
CSEPED: 6 min
CSEPEDS: 14 s
CSEPHR: 89 %
Estimated workload: 7.5 METS
LHR: 0.34
LV sys vol: 39 mL
LVDIAVOL: 97 mL (ref 62–150)
Peak HR: 148 {beats}/min
RPE: 15
SDS: 3
SRS: 0
TID: 0.63

## 2017-07-26 MED ORDER — SODIUM CHLORIDE 0.9% FLUSH
INTRAVENOUS | Status: AC
  Administered 2017-07-26: 10 mL via INTRAVENOUS
  Filled 2017-07-26: qty 10

## 2017-07-26 MED ORDER — TECHNETIUM TC 99M TETROFOSMIN IV KIT
30.0000 | PACK | Freq: Once | INTRAVENOUS | Status: AC | PRN
  Administered 2017-07-26: 32 via INTRAVENOUS

## 2017-07-26 MED ORDER — REGADENOSON 0.4 MG/5ML IV SOLN
INTRAVENOUS | Status: AC
  Filled 2017-07-26: qty 5

## 2017-07-26 MED ORDER — TECHNETIUM TC 99M TETROFOSMIN IV KIT
10.0000 | PACK | Freq: Once | INTRAVENOUS | Status: AC | PRN
  Administered 2017-07-26: 10.4 via INTRAVENOUS

## 2017-07-26 NOTE — Progress Notes (Signed)
*  PRELIMINARY RESULTS* Echocardiogram 2D Echocardiogram has been performed.  Jacob Hanna 07/26/2017, 10:29 AM

## 2017-07-26 NOTE — Telephone Encounter (Signed)
-----   Message from Laqueta Linden, MD sent at 07/26/2017  4:28 PM EDT ----- Low risk.

## 2017-07-26 NOTE — Telephone Encounter (Signed)
His primary complaint is exertional dyspnea. As the stress test was not suggestive of blockages and his echo was normal, let's obtain spirometry to evaluate for a pulmonary process.

## 2017-07-26 NOTE — Telephone Encounter (Signed)
-----   Message from Laqueta Linden, MD sent at 07/26/2017 11:57 AM EDT ----- Normal heart function.

## 2017-07-26 NOTE — Telephone Encounter (Signed)
Spoke with pt's wife. Let her know results to stress test. She wanted to know why he is still feeling so bad. She states that he cannot walk any amount of time without getting extremely SOB. He stays exhausted without evening exerting himself. His wife asks what else can they do? Please advise.

## 2017-07-26 NOTE — Telephone Encounter (Signed)
Called pt, he was not available. Left message to have pt. Return call.

## 2017-07-27 ENCOUNTER — Other Ambulatory Visit: Payer: Self-pay

## 2017-07-27 ENCOUNTER — Encounter: Payer: Self-pay | Admitting: Family Medicine

## 2017-07-27 DIAGNOSIS — R0789 Other chest pain: Secondary | ICD-10-CM | POA: Insufficient documentation

## 2017-07-27 DIAGNOSIS — R0602 Shortness of breath: Secondary | ICD-10-CM

## 2017-07-27 NOTE — Telephone Encounter (Signed)
Made pt aware of test. He voiced understanding of plan. Sent a message upfront to schedule.

## 2017-08-06 ENCOUNTER — Ambulatory Visit (HOSPITAL_COMMUNITY)
Admission: RE | Admit: 2017-08-06 | Discharge: 2017-08-06 | Disposition: A | Discharge status: Home / Self Care | Payer: Managed Care, Other (non HMO) | Source: Ambulatory Visit | Attending: Cardiovascular Disease | Admitting: Cardiovascular Disease

## 2017-08-06 DIAGNOSIS — R0602 Shortness of breath: Secondary | ICD-10-CM | POA: Insufficient documentation

## 2017-08-11 LAB — SPIROMETRY WITH GRAPH
FEF 25-75 Pre: 1.38 L/sec
FEF2575-%PRED-PRE: 42 %
FEV1-%Pred-Pre: 64 %
FEV1-PRE: 2.45 L
FEV1FVC-%Pred-Pre: 78 %
FEV6-%PRED-PRE: 84 %
FEV6-PRE: 4 L
FEV6FVC-%Pred-Pre: 103 %
FVC-%PRED-PRE: 81 %
FVC-Pre: 4.03 L
PRE FEV6/FVC RATIO: 99 %
Pre FEV1/FVC ratio: 61 %

## 2017-08-12 ENCOUNTER — Telehealth: Payer: Self-pay

## 2017-08-12 NOTE — Telephone Encounter (Signed)
Called pt., no answer, left message for pt  To return call.  

## 2017-08-12 NOTE — Telephone Encounter (Signed)
-----   Message from Lesle Chris, LPN sent at 16/11/958  3:46 PM EDT -----   ----- Message ----- From: Albertine Patricia, CMA Sent: 08/12/2017  12:55 PM To: Lesle Chris, LPN    ----- Message ----- From: Laqueta Linden, MD Sent: 08/12/2017  12:12 PM To: Staci T Ashworth, CMA  Has moderate COPD which would explain his shortness of breath. Start Advair 100 mcg/50 mcg 1 puff bid.

## 2017-08-16 ENCOUNTER — Telehealth: Payer: Self-pay

## 2017-08-16 MED ORDER — FLUTICASONE-SALMETEROL 100-50 MCG/DOSE IN AEPB: 1.0000 | INHALATION_SPRAY | Freq: Two times a day (BID) | RESPIRATORY_TRACT | 3 refills | Status: DC

## 2017-08-16 NOTE — Telephone Encounter (Signed)
-----   Message from Angela G Hill, LPN sent at 08/12/2017  3:46 PM EDT -----   ----- Message ----- From: Ashworth, Staci T, CMA Sent: 08/12/2017  12:55 PM To: Angela G Hill, LPN    ----- Message ----- From: Koneswaran, Suresh A, MD Sent: 08/12/2017  12:12 PM To: Staci T Ashworth, CMA  Has moderate COPD which would explain his shortness of breath. Start Advair 100 mcg/50 mcg 1 puff bid. 

## 2017-08-16 NOTE — Telephone Encounter (Signed)
Per patient, I sent Advair to [facility].

## 2017-08-30 ENCOUNTER — Encounter: Payer: Self-pay | Admitting: Cardiovascular Disease

## 2017-08-30 ENCOUNTER — Telehealth: Payer: Self-pay

## 2017-08-30 MED ORDER — FLUTICASONE-SALMETEROL 113-14 MCG/ACT IN AEPB: 1.0000 | INHALATION_SPRAY | Freq: Two times a day (BID) | RESPIRATORY_TRACT | 3 refills | Status: DC

## 2017-08-30 NOTE — Telephone Encounter (Signed)
I e-scribed inhalers to [employer], pt made aware

## 2017-08-30 NOTE — Telephone Encounter (Signed)
-----   Message from Laqueta LindenSuresh A Koneswaran, MD sent at 08/30/2017 10:29 AM EDT ----- Regarding: RE: rx I can prescribe.  ----- Message ----- From: Nori Riisarlton, Eneida Evers A, RN Sent: 08/30/2017  10:02 AM To: Laqueta LindenSuresh A Koneswaran, MD Subject: rx                                             Jacob Hanna  to Laqueta LindenSuresh A Koneswaran, MD       9:36 AM  I was told after all those tests for the shortness of breath and tiredness that I have Moderate COPD and was going to get a prescription of Fluticasone-Salmeterol 100-50 MCG/DOSE [facility]b [facility] [place]. I checked what the cost of it will be through my insurance and it will cost about 385.00 each month. [employer] gave me a alternative that will cost only 70.00 a month and that is Fluticasone Propionate/Sammeterol INH Aer Powder BA 113/14 MCG/ACT. Will this be a good alternative? If so [facility] [place]. Please let me know if this is ok   Thank You  [name]      Hi Dr Kirtland BouchardK,  Are we prescribing this, or should his pcp be involved ?   Thanks  [employer]

## 2017-08-31 ENCOUNTER — Encounter: Payer: Self-pay | Admitting: Cardiovascular Disease

## 2017-10-18 ENCOUNTER — Ambulatory Visit: Payer: Managed Care, Other (non HMO) | Admitting: Cardiovascular Disease

## 2017-12-21 ENCOUNTER — Emergency Department (HOSPITAL_COMMUNITY)
Admission: EM | Admit: 2017-12-21 | Discharge: 2017-12-21 | Disposition: A | Discharge status: Home / Self Care | Attending: Emergency Medicine | Admitting: Emergency Medicine

## 2017-12-21 ENCOUNTER — Other Ambulatory Visit: Payer: Self-pay

## 2017-12-21 ENCOUNTER — Encounter (HOSPITAL_COMMUNITY): Payer: Self-pay | Admitting: Emergency Medicine

## 2017-12-21 ENCOUNTER — Emergency Department (HOSPITAL_COMMUNITY)

## 2017-12-21 DIAGNOSIS — Z79899 Other long term (current) drug therapy: Secondary | ICD-10-CM | POA: Diagnosis not present

## 2017-12-21 DIAGNOSIS — Y92410 Unspecified street and highway as the place of occurrence of the external cause: Secondary | ICD-10-CM | POA: Insufficient documentation

## 2017-12-21 DIAGNOSIS — S39012A Strain of muscle, fascia and tendon of lower back, initial encounter: Secondary | ICD-10-CM | POA: Insufficient documentation

## 2017-12-21 DIAGNOSIS — S8002XA Contusion of left knee, initial encounter: Secondary | ICD-10-CM

## 2017-12-21 DIAGNOSIS — S3992XA Unspecified injury of lower back, initial encounter: Secondary | ICD-10-CM | POA: Diagnosis present

## 2017-12-21 DIAGNOSIS — Y999 Unspecified external cause status: Secondary | ICD-10-CM | POA: Diagnosis not present

## 2017-12-21 DIAGNOSIS — Y9389 Activity, other specified: Secondary | ICD-10-CM | POA: Diagnosis not present

## 2017-12-21 MED ORDER — IBUPROFEN 800 MG PO TABS: 800.0000 mg | ORAL_TABLET | Freq: Three times a day (TID) | ORAL | 0 refills | Status: DC

## 2017-12-21 MED ORDER — IBUPROFEN 800 MG PO TABS
800.0000 mg | ORAL_TABLET | Freq: Once | ORAL | Status: AC
  Administered 2017-12-21: 800 mg via ORAL
  Filled 2017-12-21: qty 1

## 2017-12-21 MED ORDER — HYDROCODONE-ACETAMINOPHEN 5-325 MG PO TABS
1.0000 | ORAL_TABLET | Freq: Once | ORAL | Status: AC
  Administered 2017-12-21: 1 via ORAL
  Filled 2017-12-21: qty 1

## 2017-12-21 MED ORDER — CYCLOBENZAPRINE HCL 5 MG PO TABS
5.0000 mg | ORAL_TABLET | Freq: Three times a day (TID) | ORAL | 0 refills | Status: DC
Start: 2017-12-21 — End: 2017-12-21

## 2017-12-21 MED ORDER — CYCLOBENZAPRINE HCL 5 MG PO TABS: 5.0000 mg | ORAL_TABLET | Freq: Three times a day (TID) | ORAL | 0 refills | Status: DC

## 2017-12-21 MED ORDER — HYDROCODONE-ACETAMINOPHEN 5-325 MG PO TABS: 1.0000 | ORAL_TABLET | ORAL | 0 refills | Status: DC | PRN
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
## 2017-12-28 ENCOUNTER — Telehealth: Payer: Self-pay | Admitting: Orthopedic Surgery
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
## 2017-12-28 NOTE — Telephone Encounter (Signed)
Really   I ll let u know

## 2018-01-13 ENCOUNTER — Other Ambulatory Visit: Payer: Self-pay | Admitting: *Deleted

## 2018-01-13 ENCOUNTER — Telehealth: Payer: Self-pay | Admitting: *Deleted

## 2018-01-13 MED ORDER — FLUTICASONE-SALMETEROL 113-14 MCG/ACT IN AEPB: 1.0000 | INHALATION_SPRAY | Freq: Two times a day (BID) | RESPIRATORY_TRACT | 3 refills | Status: DC

## 2018-01-13 NOTE — Telephone Encounter (Signed)
-----   Message from Laqueta LindenSuresh A Koneswaran, MD sent at 01/13/2018  9:30 AM EST ----- This note says only $70 per month?

## 2018-01-13 NOTE — Telephone Encounter (Signed)
Spoke with pt. Informed him that [employer] states they are unable to supply his Fluticasone-Salmeterol inh. All other inhalers in pts plan range from $700- $900. Pt states he is unable to pay this at this time. Pt inform to be seen by PCP. And given [employer] discount card for current inh.

## 2018-02-07 ENCOUNTER — Other Ambulatory Visit: Payer: Self-pay

## 2018-02-07 ENCOUNTER — Ambulatory Visit (INDEPENDENT_AMBULATORY_CARE_PROVIDER_SITE_OTHER): Payer: Managed Care, Other (non HMO) | Admitting: Family Medicine

## 2018-02-07 ENCOUNTER — Encounter: Payer: Self-pay | Admitting: Family Medicine

## 2018-02-07 DIAGNOSIS — D509 Iron deficiency anemia, unspecified: Secondary | ICD-10-CM

## 2018-02-07 DIAGNOSIS — Z125 Encounter for screening for malignant neoplasm of prostate: Secondary | ICD-10-CM

## 2018-02-07 DIAGNOSIS — J449 Chronic obstructive pulmonary disease, unspecified: Secondary | ICD-10-CM | POA: Diagnosis not present

## 2018-02-07 DIAGNOSIS — E119 Type 2 diabetes mellitus without complications: Secondary | ICD-10-CM | POA: Diagnosis not present

## 2018-02-07 DIAGNOSIS — Z23 Encounter for immunization: Secondary | ICD-10-CM

## 2018-02-07 DIAGNOSIS — R5383 Other fatigue: Secondary | ICD-10-CM

## 2018-02-07 DIAGNOSIS — Z13220 Encounter for screening for lipoid disorders: Secondary | ICD-10-CM | POA: Diagnosis not present

## 2018-02-07 DIAGNOSIS — Z79899 Other long term (current) drug therapy: Secondary | ICD-10-CM | POA: Diagnosis not present

## 2018-02-07 MED ORDER — MOMETASONE FUROATE 0.1 % EX CREA: TOPICAL_CREAM | CUTANEOUS | 1 refills | Status: DC

## 2018-02-07 MED ORDER — ALBUTEROL SULFATE HFA 108 (90 BASE) MCG/ACT IN AERS: 2.0000 | INHALATION_SPRAY | RESPIRATORY_TRACT | 4 refills | Status: DC | PRN

## 2018-02-07 NOTE — Progress Notes (Signed)
   Subjective:    Patient ID: Jacob Hanna, male    DOB: 11/23/1960, 57 y.o.   MRN: [id number]  HPI  Patient is here today to follow up on Copd. He states he can not afford fluticason 113-14 one puff Bid. Significant COPD issues.  Because of some shortness of breath when he is doing physical exercise does not bother him when he is doing routine activity Cannot afford COPD medications Does not smoke Left knee intermittent pain he will be having a follow-up with his orthopedist    Review of Systems  Constitutional: Negative for activity change, fatigue and fever.  HENT: Negative for congestion and rhinorrhea.   Respiratory: Negative for cough and shortness of breath.   Cardiovascular: Negative for chest pain and leg swelling.  Gastrointestinal: Negative for abdominal pain, diarrhea and nausea.  Genitourinary: Negative for dysuria and hematuria.  Neurological: Negative for weakness and headaches.  Psychiatric/Behavioral: Negative for behavioral problems.       Objective:   Physical Exam  Constitutional: He appears well-nourished. No distress.  HENT:  Head: Normocephalic and atraumatic.  Eyes: Right eye exhibits no discharge. Left eye exhibits no discharge.  Neck: No tracheal deviation present.  Cardiovascular: Normal rate, regular rhythm and normal heart sounds.  No murmur heard. Pulmonary/Chest: Effort normal and breath sounds normal. No respiratory distress. He has no wheezes.  Musculoskeletal: He exhibits no edema.  Lymphadenopathy:    He has no cervical adenopathy.  Neurological: He is alert.  Skin: Skin is warm. No rash noted.  Psychiatric: His behavior is normal.  Vitals reviewed.         Assessment & Plan:  COPD Pneumonia vaccine Use albuterol as needed Stop other medication Follow-up of ongoing troubles His level of COPD is not severe therefore daily medicine not necessary currently Keep all regular health checkups  The patient also has been small  amount of poison ivy steroid cream prescribed  Labs for wellness visit later this year

## 2018-06-11 LAB — PSA: PROSTATE SPECIFIC AG, SERUM: 0.7 ng/mL (ref 0.0–4.0)

## 2018-06-11 LAB — LIPID PANEL
CHOL/HDL RATIO: 4.3 ratio (ref 0.0–5.0)
Cholesterol, Total: 189 mg/dL (ref 100–199)
HDL: 44 mg/dL (ref >39)
LDL Calculated: 124 mg/dL — ABNORMAL HIGH (ref 0–99)
TRIGLYCERIDES: 106 mg/dL (ref 0–149)
VLDL Cholesterol Cal: 21 mg/dL (ref 5–40)

## 2018-06-11 LAB — HEPATIC FUNCTION PANEL
ALBUMIN: 4.3 g/dL (ref 3.5–5.5)
ALT: 9 IU/L (ref 0–44)
AST: 15 IU/L (ref 0–40)
Alkaline Phosphatase: 112 IU/L (ref 39–117)
Bilirubin Total: 0.2 mg/dL (ref 0.0–1.2)
Bilirubin, Direct: 0.08 mg/dL (ref 0.00–0.40)
Total Protein: 6.6 g/dL (ref 6.0–8.5)

## 2018-06-11 LAB — BASIC METABOLIC PANEL
BUN/Creatinine Ratio: 10 (ref 9–20)
BUN: 10 mg/dL (ref 6–24)
CHLORIDE: 102 mmol/L (ref 96–106)
CO2: 23 mmol/L (ref 20–29)
Calcium: 9.1 mg/dL (ref 8.7–10.2)
Creatinine, Ser: 1.03 mg/dL (ref 0.76–1.27)
GFR calc Af Amer: 94 mL/min/{1.73_m2} (ref >59)
GFR, EST NON AFRICAN AMERICAN: 81 mL/min/{1.73_m2} (ref >59)
Glucose: 101 mg/dL — ABNORMAL HIGH (ref 65–99)
POTASSIUM: 4.3 mmol/L (ref 3.5–5.2)
SODIUM: 140 mmol/L (ref 134–144)

## 2018-06-11 LAB — CBC WITH DIFFERENTIAL/PLATELET
BASOS ABS: 0.1 10*3/uL (ref 0.0–0.2)
Basos: 1 %
EOS (ABSOLUTE): 0.2 10*3/uL (ref 0.0–0.4)
Eos: 4 %
Hematocrit: 35.4 % — ABNORMAL LOW (ref 37.5–51.0)
Hemoglobin: 11.4 g/dL — ABNORMAL LOW (ref 13.0–17.7)
IMMATURE GRANS (ABS): 0 10*3/uL (ref 0.0–0.1)
IMMATURE GRANULOCYTES: 0 %
LYMPHS: 38 %
Lymphocytes Absolute: 1.8 10*3/uL (ref 0.7–3.1)
MCH: 28 pg (ref 26.6–33.0)
MCHC: 32.2 g/dL (ref 31.5–35.7)
MCV: 87 fL (ref 79–97)
Monocytes Absolute: 0.6 10*3/uL (ref 0.1–0.9)
Monocytes: 12 %
NEUTROS ABS: 2.1 10*3/uL (ref 1.4–7.0)
NEUTROS PCT: 45 %
PLATELETS: 384 10*3/uL (ref 150–450)
RBC: 4.07 x10E6/uL — AB (ref 4.14–5.80)
RDW: 14.5 % (ref 12.3–15.4)
WBC: 4.8 10*3/uL (ref 3.4–10.8)

## 2018-06-11 LAB — IRON AND TIBC
IRON SATURATION: 5 % — AB (ref 15–55)
IRON: 25 ug/dL — AB (ref 38–169)
Total Iron Binding Capacity: 511 ug/dL — ABNORMAL HIGH (ref 250–450)
UIBC: 486 ug/dL — ABNORMAL HIGH (ref 111–343)

## 2018-06-11 LAB — FERRITIN: FERRITIN: 7 ng/mL — AB (ref 30–400)

## 2018-06-27 ENCOUNTER — Ambulatory Visit (INDEPENDENT_AMBULATORY_CARE_PROVIDER_SITE_OTHER): Payer: Managed Care, Other (non HMO) | Admitting: Family Medicine

## 2018-06-27 ENCOUNTER — Encounter: Payer: Self-pay | Admitting: Family Medicine

## 2018-06-27 VITALS — BP 148/90 | Ht 69.25 in | Wt 238.0 lb

## 2018-06-27 DIAGNOSIS — R7301 Impaired fasting glucose: Secondary | ICD-10-CM | POA: Diagnosis not present

## 2018-06-27 DIAGNOSIS — Z0000 Encounter for general adult medical examination without abnormal findings: Secondary | ICD-10-CM

## 2018-06-27 DIAGNOSIS — D509 Iron deficiency anemia, unspecified: Secondary | ICD-10-CM | POA: Diagnosis not present

## 2018-06-27 LAB — POCT GLYCOSYLATED HEMOGLOBIN (HGB A1C): Hemoglobin A1C: 5 % (ref 4.0–5.6)
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
## 2018-06-28 ENCOUNTER — Other Ambulatory Visit: Payer: Self-pay

## 2018-06-28 DIAGNOSIS — D509 Iron deficiency anemia, unspecified: Secondary | ICD-10-CM

## 2018-06-28 LAB — IFOBT (OCCULT BLOOD): IMMUNOLOGICAL FECAL OCCULT BLOOD TEST: NEGATIVE

## 2018-08-25 NOTE — Patient Instructions (Addendum)
Jacob Hanna  1960/11/18     Your procedure is scheduled on:  09-12-2018    Report to [facility] Main  Entrance,  Report to admitting at  12:15 PM    Call this number if you have problems the morning of surgery [phone]     Remember: Do not eat food After Midnight.  May have clear liquid diet from midnight until 8:45 AM day of surgery then nothing by mouth.                                                           BRUSH YOUR TEETH MORNING OF SURGERY AND RINSE YOUR MOUTH OUT, NO CHEWING GUM CANDY OR MINTS.     Take these medicines the morning of surgery with A SIP OF WATER:   Bring Albuterol Inhaler with you day of surgery                                 You may not have any metal on your body including hair pins and              piercings               Do not wear jewelry, make-up, lotions, powders or perfumes, deodorant                         Men may shave face and neck.   Do not bring valuables to the hospital. [facility] IS NOT             RESPONSIBLE   FOR VALUABLES.  Contacts, dentures or bridgework may not be worn into surgery.  Leave suitcase in the car. After surgery it may be brought to your room.     Special Instructions: N/A               _____________________________________________________________________     CLEAR LIQUID DIET   Foods Allowed                                                                     Foods Excluded  Coffee and tea, regular and decaf                             liquids that you cannot  Plain Jell-O in any flavor                                             see through such as: Fruit ices (not with fruit pulp)  milk, soups, orange juice  Iced Popsicles                                    All solid food Carbonated beverages, regular and diet                                    Cranberry, grape and apple juices Sports drinks like Gatorade Lightly seasoned clear  broth or consume(fat free) Sugar, honey syrup  Sample Menu Breakfast                                Lunch                                     Supper Cranberry juice                    Beef broth                            Chicken broth Jell-O                                     Grape juice                           Apple juice Coffee or tea                        Jell-O                                      Popsicle                                                Coffee or tea                        Coffee or tea  _____________________________________________________________________            [facility] - Preparing for Surgery Before surgery, you can play an important role.  Because skin is not sterile, your skin needs to be as free of germs as possible.  You can reduce the number of germs on your skin by washing with CHG (chlorahexidine gluconate) soap before surgery.  CHG is an antiseptic cleaner which kills germs and bonds with the skin to continue killing germs even after washing. Please DO NOT use if you have an allergy to CHG or antibacterial soaps.  If your skin becomes reddened/irritated stop using the CHG and inform your nurse when you arrive at Short Stay. Do not shave (including legs and underarms) for at least 48 hours prior to the first CHG shower.  You may shave your face/neck. Please follow these instructions carefully:  1.  Shower with CHG Soap the night before surgery and the  morning of Surgery.  2.  If you choose to wash your hair, wash your hair first as usual with your  normal  shampoo.  3.  After you shampoo, rinse your hair and body thoroughly to remove the  shampoo.                            4.  Use CHG as you would any other liquid soap.  You can apply chg directly  to the skin and wash                       Gently with a scrungie or clean washcloth.  5.  Apply the CHG Soap to your body ONLY FROM THE NECK DOWN.   Do not use on face/ open                            Wound or open sores. Avoid contact with eyes, ears mouth and genitals (private parts).                       Wash face,  Genitals (private parts) with your normal soap.             6.  Wash thoroughly, paying special attention to the area where your surgery  will be performed.  7.  Thoroughly rinse your body with warm water from the neck down.  8.  DO NOT shower/wash with your normal soap after using and rinsing off  the CHG Soap.             9.  Pat yourself dry with a clean towel.            10.  Wear clean pajamas.            11.  Place clean sheets on your bed the night of your first shower and do not  sleep with pets. Day of Surgery : Do not apply any lotions/deodorants the morning of surgery.  Please wear clean clothes to the hospital/surgery center.  FAILURE TO FOLLOW THESE INSTRUCTIONS MAY RESULT IN THE CANCELLATION OF YOUR SURGERY PATIENT SIGNATURE_________________________________  NURSE SIGNATURE__________________________________  ________________________________________________________________________   Jacob Hanna  An incentive spirometer is a tool that can help keep your lungs clear and active. This tool measures how well you are filling your lungs with each breath. Taking long deep breaths may help reverse or decrease the chance of developing breathing (pulmonary) problems (especially infection) following:  A long period of time when you are unable to move or be active. BEFORE THE PROCEDURE   If the spirometer includes an indicator to show your best effort, your nurse or respiratory therapist will set it to a desired goal.  If possible, sit up straight or lean slightly forward. Try not to slouch.  Hold the incentive spirometer in an upright position. INSTRUCTIONS FOR USE  1. Sit on the edge of your bed if possible, or sit up as far as you can in bed or on a chair. 2. Hold the incentive spirometer in an upright position. 3. Breathe out normally. 4. Place the  mouthpiece in your mouth and seal your lips tightly around it. 5. Breathe in slowly and as deeply as possible, raising the piston or the ball toward the top of the column. 6. Hold your breath for 3-5 seconds or for as long as possible. Allow  the piston or ball to fall to the bottom of the column. 7. Remove the mouthpiece from your mouth and breathe out normally. 8. Rest for a few seconds and repeat Steps 1 through 7 at least 10 times every 1-2 hours when you are awake. Take your time and take a few normal breaths between deep breaths. 9. The spirometer may include an indicator to show your best effort. Use the indicator as a goal to work toward during each repetition. 10. After each set of 10 deep breaths, practice coughing to be sure your lungs are clear. If you have an incision (the cut made at the time of surgery), support your incision when coughing by placing a pillow or rolled up towels firmly against it. Once you are able to get out of bed, walk around indoors and cough well. You may stop using the incentive spirometer when instructed by your caregiver.  RISKS AND COMPLICATIONS  Take your time so you do not get dizzy or light-headed.  If you are in pain, you may need to take or ask for pain medication before doing incentive spirometry. It is harder to take a deep breath if you are having pain. AFTER USE  Rest and breathe slowly and easily.  It can be helpful to keep track of a log of your progress. Your caregiver can provide you with a simple table to help with this. If you are using the spirometer at home, follow these instructions: SEEK MEDICAL CARE IF:   You are having difficultly using the spirometer.  You have trouble using the spirometer as often as instructed.  Your pain medication is not giving enough relief while using the spirometer.  You develop fever of 100.5 F (38.1 C) or higher. SEEK IMMEDIATE MEDICAL CARE IF:   You cough up bloody sputum that had not been present  before.  You develop fever of 102 F (38.9 C) or greater.  You develop worsening pain at or near the incision site. MAKE SURE YOU:   Understand these instructions.  Will watch your condition.  Will get help right away if you are not doing well or get worse. Document Released: 03/08/2007 Document Revised: 01/18/2012 Document Reviewed: 05/09/2007 Exit[employer] Patient Information 2014 Exit[employer], [place].   ________________________________________________________________________  WHAT IS A BLOOD TRANSFUSION? Blood Transfusion Information  A transfusion is the replacement of blood or some of its parts. Blood is made up of multiple cells which provide different functions.  Red blood cells carry oxygen and are used for blood loss replacement.  White blood cells fight against infection.  Platelets control bleeding.  Plasma helps clot blood.  Other blood products are available for specialized needs, such as hemophilia or other clotting disorders. BEFORE THE TRANSFUSION  Who gives blood for transfusions?   Healthy volunteers who are fully evaluated to make sure their blood is safe. This is blood bank blood. Transfusion therapy is the safest it has ever been in the practice of medicine. Before blood is taken from a donor, a complete history is taken to make sure that person has no history of diseases nor engages in risky social behavior (examples are intravenous drug use or sexual activity with multiple partners). The donor's travel history is screened to minimize risk of transmitting infections, such as malaria. The donated blood is tested for signs of infectious diseases, such as HIV and hepatitis. The blood is then tested to be sure it is compatible with you in order to minimize the chance of a transfusion reaction. If  you or a relative donates blood, this is often done in anticipation of surgery and is not appropriate for emergency situations. It takes many days to process the donated  blood. RISKS AND COMPLICATIONS Although transfusion therapy is very safe and saves many lives, the main dangers of transfusion include:   Getting an infectious disease.  Developing a transfusion reaction. This is an allergic reaction to something in the blood you were given. Every precaution is taken to prevent this. The decision to have a blood transfusion has been considered carefully by your caregiver before blood is given. Blood is not given unless the benefits outweigh the risks. AFTER THE TRANSFUSION  Right after receiving a blood transfusion, you will usually feel much better and more energetic. This is especially true if your red blood cells have gotten low (anemic). The transfusion raises the level of the red blood cells which carry oxygen, and this usually causes an energy increase.  The nurse administering the transfusion will monitor you carefully for complications. HOME CARE INSTRUCTIONS  No special instructions are needed after a transfusion. You may find your energy is better. Speak with your caregiver about any limitations on activity for underlying diseases you may have. SEEK MEDICAL CARE IF:   Your condition is not improving after your transfusion.  You develop redness or irritation at the intravenous (IV) site. SEEK IMMEDIATE MEDICAL CARE IF:  Any of the following symptoms occur over the next 12 hours:  Shaking chills.  You have a temperature by mouth above 102 F (38.9 C), not controlled by medicine.  Chest, back, or muscle pain.  People around you feel you are not acting correctly or are confused.  Shortness of breath or difficulty breathing.  Dizziness and fainting.  You get a rash or develop hives.  You have a decrease in urine output.  Your urine turns a dark color or changes to pink, red, or brown. Any of the following symptoms occur over the next 10 days:  You have a temperature by mouth above 102 F (38.9 C), not controlled by  medicine.  Shortness of breath.  Weakness after normal activity.  The white part of the eye turns yellow (jaundice).  You have a decrease in the amount of urine or are urinating less often.  Your urine turns a dark color or changes to pink, red, or brown. Document Released: 10/23/2000 Document Revised: 01/18/2012 Document Reviewed: 06/11/2008 ExitCare Patient Information 2014 [place], [place].  _______________________________________________________________________    Incentive Spirometer  An incentive spirometer is a tool that can help keep your lungs clear and active. This tool measures how well you are filling your lungs with each breath. Taking long deep breaths may help reverse or decrease the chance of developing breathing (pulmonary) problems (especially infection) following:  A long period of time when you are unable to move or be active. BEFORE THE PROCEDURE   If the spirometer includes an indicator to show your best effort, your nurse or respiratory therapist will set it to a desired goal.  If possible, sit up straight or lean slightly forward. Try not to slouch.  Hold the incentive spirometer in an upright position. INSTRUCTIONS FOR USE  11. Sit on the edge of your bed if possible, or sit up as far as you can in bed or on a chair. 12. Hold the incentive spirometer in an upright position. 13. Breathe out normally. 14. Place the mouthpiece in your mouth and seal your lips tightly around it. 15. Breathe in slowly and  as deeply as possible, raising the piston or the ball toward the top of the column. 16. Hold your breath for 3-5 seconds or for as long as possible. Allow the piston or ball to fall to the bottom of the column. 17. Remove the mouthpiece from your mouth and breathe out normally. 18. Rest for a few seconds and repeat Steps 1 through 7 at least 10 times every 1-2 hours when you are awake. Take your time and take a few normal breaths between deep  breaths. 19. The spirometer may include an indicator to show your best effort. Use the indicator as a goal to work toward during each repetition. 20. After each set of 10 deep breaths, practice coughing to be sure your lungs are clear. If you have an incision (the cut made at the time of surgery), support your incision when coughing by placing a pillow or rolled up towels firmly against it. Once you are able to get out of bed, walk around indoors and cough well. You may stop using the incentive spirometer when instructed by your caregiver.  RISKS AND COMPLICATIONS  Take your time so you do not get dizzy or light-headed.  If you are in pain, you may need to take or ask for pain medication before doing incentive spirometry. It is harder to take a deep breath if you are having pain. AFTER USE  Rest and breathe slowly and easily.  It can be helpful to keep track of a log of your progress. Your caregiver can provide you with a simple table to help with this. If you are using the spirometer at home, follow these instructions: SEEK MEDICAL CARE IF:   You are having difficultly using the spirometer.  You have trouble using the spirometer as often as instructed.  Your pain medication is not giving enough relief while using the spirometer.  You develop fever of 100.5 F (38.1 C) or higher. SEEK IMMEDIATE MEDICAL CARE IF:   You cough up bloody sputum that had not been present before.  You develop fever of 102 F (38.9 C) or greater.  You develop worsening pain at or near the incision site. MAKE SURE YOU:   Understand these instructions.  Will watch your condition.  Will get help right away if you are not doing well or get worse. Document Released: 03/08/2007 Document Revised: 01/18/2012 Document Reviewed: 05/09/2007 [facility] Patient Information 2014 [place], [place].   ________________________________________________________________________

## 2018-09-05 ENCOUNTER — Other Ambulatory Visit: Payer: Self-pay

## 2018-09-05 ENCOUNTER — Other Ambulatory Visit: Payer: Self-pay | Admitting: Orthopedic Surgery

## 2018-09-05 ENCOUNTER — Encounter (HOSPITAL_COMMUNITY): Payer: Self-pay

## 2018-09-05 ENCOUNTER — Encounter (HOSPITAL_COMMUNITY)
Admission: RE | Admit: 2018-09-05 | Discharge: 2018-09-05 | Disposition: A | Discharge status: Home / Self Care | Source: Ambulatory Visit | Attending: Orthopedic Surgery | Admitting: Orthopedic Surgery

## 2018-09-05 DIAGNOSIS — Z01812 Encounter for preprocedural laboratory examination: Secondary | ICD-10-CM | POA: Diagnosis present

## 2018-09-05 HISTORY — DX: Obstructive sleep apnea (adult) (pediatric): G47.33

## 2018-09-05 HISTORY — DX: Chronic obstructive pulmonary disease, unspecified: J44.9

## 2018-09-05 HISTORY — DX: Unspecified osteoarthritis, unspecified site: M19.90

## 2018-09-05 HISTORY — DX: Other allergy status, other than to drugs and biological substances: Z91.09

## 2018-09-05 HISTORY — DX: Iron deficiency anemia, unspecified: D50.9

## 2018-09-05 HISTORY — DX: Exercise induced bronchospasm: J45.990

## 2018-09-05 LAB — BASIC METABOLIC PANEL
Anion gap: 6 (ref 5–15)
BUN: 16 mg/dL (ref 6–20)
CO2: 28 mmol/L (ref 22–32)
CREATININE: 0.94 mg/dL (ref 0.61–1.24)
Calcium: 9.1 mg/dL (ref 8.9–10.3)
Chloride: 107 mmol/L (ref 98–111)
GFR calc Af Amer: >60 mL/min (ref >60)
Glucose, Bld: 103 mg/dL — ABNORMAL HIGH (ref 70–99)
POTASSIUM: 4.3 mmol/L (ref 3.5–5.1)
SODIUM: 141 mmol/L (ref 135–145)

## 2018-09-05 LAB — SURGICAL PCR SCREEN
MRSA, PCR: NEGATIVE
Staphylococcus aureus: NEGATIVE

## 2018-09-05 LAB — CBC
HCT: 39.1 % (ref 39.0–52.0)
HEMOGLOBIN: 12.8 g/dL — AB (ref 13.0–17.0)
MCH: 31.1 pg (ref 26.0–34.0)
MCHC: 32.7 g/dL (ref 30.0–36.0)
MCV: 95.1 fL (ref 80.0–100.0)
Platelets: 321 10*3/uL (ref 150–400)
RBC: 4.11 MIL/uL — AB (ref 4.22–5.81)
RDW: 13.3 % (ref 11.5–15.5)
WBC: 4.6 10*3/uL (ref 4.0–10.5)
nRBC: 0 % (ref 0.0–0.2)

## 2018-09-09 DIAGNOSIS — M1712 Unilateral primary osteoarthritis, left knee: Secondary | ICD-10-CM | POA: Diagnosis present

## 2018-09-09 NOTE — H&P (Signed)
TOTAL KNEE ADMISSION H&P  Patient is being admitted for left total knee arthroplasty.  Subjective:  Chief Complaint:left knee pain.  HPI: Jacob Hanna, 57 y.o. male, has a history of pain and functional disability in the left knee due to trauma and arthritis and has failed non-surgical conservative treatments for greater than 12 weeks to includeNSAID's and/or analgesics, corticosteriod injections, viscosupplementation injections, flexibility and strengthening excercises, supervised PT with diminished ADL's post treatment, use of assistive devices, weight reduction as appropriate and activity modification.  Onset of symptoms was abrupt, starting 1 years ago with rapidlly worsening course since that time. The patient noted prior procedures on the knee to include  arthroscopy on the left knee(s).  Patient currently rates pain in the left knee(s) at 10 out of 10 with activity. Patient has night pain, worsening of pain with activity and weight bearing, pain that interferes with activities of daily living, pain with passive range of motion, crepitus and joint swelling.  Patient has evidence of joint space narrowing by imaging studies.   There is no active infection.  Patient Active Problem List   Diagnosis Date Noted  . COPD (chronic obstructive pulmonary disease) (HCC) 02/07/2018  . Other chest pain 07/27/2017  . IDA (iron deficiency anemia) 06/15/2016  . Obstructive sleep apnea 12/02/2015  . Prediabetes 04/27/2013  . Hyperlipidemia 04/27/2013  . Acute medial meniscus tear 04/11/2013  . S/P arthroscopy of left knee 03/21/2013  . Medial meniscus, posterior horn derangement 01/31/2013  . Synovial plica of left knee 01/31/2013  . Acute medial meniscus tear of left knee 01/26/2013  . GI bleed 08/29/2012  . Iron deficiency anemia 06/21/2012  . Guaiac positive stools 06/21/2012   Past Medical History:  Diagnosis Date  . Allergy to environmental factors   . COPD (chronic obstructive pulmonary  disease) (HCC)   . Exercise-induced asthma   . IDA (iron deficiency anemia)   . Mild obstructive sleep apnea    per study 03-27-2013 (in epic)  no cpap recommended  . OA (osteoarthritis)    left knee    Past Surgical History:  Procedure Laterality Date  . CARDIOVASCULAR STRESS TEST  07/26/2017   Low risk nuclear study w/ no ischemia/  normal LV function and wall motion, nuclear stress ef 60%  . CHONDROPLASTY Left 04/14/2013   Procedure: CHONDROPLASTY;  Surgeon: Vickki Hearing, MD;  Location: [facility];  Service: Orthopedics;  Laterality: Left;  . COLONOSCOPY    . ESOPHAGOGASTRODUODENOSCOPY    . GIVENS CAPSULE STUDY  09/07/2012   Procedure: GIVENS CAPSULE STUDY;  Surgeon: Malissa Hippo, MD;  Location: [facility] SUITE;  Service: Endoscopy;  Laterality: N/A;  730  . GIVENS CAPSULE STUDY N/A 06/23/2016   Procedure: GIVENS CAPSULE STUDY;  Surgeon: Malissa Hippo, MD;  Location: [facility];  Service: Endoscopy;  Laterality: N/A;  730  . KNEE ARTHROSCOPY Left 06/ 2019  @[facility]   . KNEE ARTHROSCOPY WITH EXCISION PLICA Left 01/31/2013   Procedure: KNEE ARTHROSCOPY WITH EXCISION PLICA;  Surgeon: Vickki Hearing, MD;  Location: [facility];  Service: Orthopedics;  Laterality: Left;  . KNEE ARTHROSCOPY WITH MEDIAL MENISECTOMY Left 01/31/2013   Procedure: KNEE ARTHROSCOPY WITH PARTIAL  MEDIAL MENISECTOMY;  Surgeon: Vickki Hearing, MD;  Location: [facility];  Service: Orthopedics;  Laterality: Left;  . KNEE ARTHROSCOPY WITH MEDIAL MENISECTOMY Left 04/14/2013   Procedure: KNEE ARTHROSCOPY WITH PARTIAL MEDIAL MENISECTOMY;  Surgeon: Vickki Hearing, MD;  Location: [facility];  Service: Orthopedics;  Laterality: Left;  Partial medial menisectomy    No current facility-administered medications for this encounter.    Current Outpatient Medications  Medication Sig Dispense Refill Last Dose  . albuterol (PROVENTIL HFA;VENTOLIN HFA) 108 (90 Base) MCG/ACT inhaler Inhale 2 puffs into the lungs every 4 (four) hours as  needed for wheezing or shortness of breath. 1 Inhaler 4 Taking  . Cinnamon 500 MG capsule Take 1,000 mg by mouth 2 (two) times daily.   Taking  . cyanocobalamin 1000 MCG tablet Take 1,000 mcg by mouth 2 (two) times daily.    Taking  . ferrous sulfate 325 (65 FE) MG tablet Take 325 mg by mouth 2 (two) times daily with a meal.   Taking  . meloxicam (MOBIC) 15 MG tablet Take 15 mg by mouth daily as needed for pain.    Taking  . guaiFENesin (MUCINEX) 600 MG 12 hr tablet Take by mouth 2 (two) times daily as needed.     Marland Kitchen HYDROcodone-acetaminophen (NORCO/VICODIN) 5-325 MG tablet Take 1 tablet by mouth every 4 (four) hours as needed. (Patient not taking: Reported on 08/30/2018) 15 tablet 0 Not Taking at Unknown time  . ibuprofen (ADVIL,MOTRIN) 200 MG tablet Take 800 mg by mouth every 6 (six) hours as needed.     Marland Kitchen ibuprofen (ADVIL,MOTRIN) 800 MG tablet Take 1 tablet (800 mg total) by mouth 3 (three) times daily. (Patient not taking: Reported on 09/05/2018) 21 tablet 0 Not Taking at Unknown time  . mometasone (ELOCON) 0.1 % cream Apply to affected area daily (Patient not taking: Reported on 09/05/2018) 45 g 1 Not Taking at Unknown time  . pseudoephedrine (SUDAFED) 30 MG tablet Take 30 mg by mouth every 4 (four) hours as needed for congestion.      No Known Allergies  Social History   Tobacco Use  . Smoking status: Never Smoker  . Smokeless tobacco: Never Used  Substance Use Topics  . Alcohol use: No    Family History  Problem Relation Age of Onset  . Heart attack Father        Pacemaker  . Diabetes Sister   . Diabetes Sister      Review of Systems  Constitutional: Negative.   HENT: Negative.   Eyes: Negative.   Respiratory:       COPD  Cardiovascular: Negative.   Gastrointestinal: Negative.   Genitourinary: Negative.   Musculoskeletal: Positive for joint pain.  Skin: Negative.   Neurological: Negative.   Endo/Heme/Allergies: Negative.   Psychiatric/Behavioral: Negative.      Objective:  Physical Exam  Constitutional: He is oriented to person, place, and time. He appears well-developed and well-nourished.  HENT:  Head: Normocephalic and atraumatic.  Eyes: Pupils are equal, round, and reactive to light.  Neck: Normal range of motion. Neck supple.  Cardiovascular: Intact distal pulses.  Respiratory: Effort normal.  Musculoskeletal: He exhibits tenderness.  knee has a 2+ effusion today.  Range of motion is from 10-110.  Collateral ligaments are stable.  Tender to palpation on the medial joint line and anteriorly.  He walks with a profound left-sided limp.    Neurological: He is alert and oriented to person, place, and time.  Skin: Skin is warm and dry.  Psychiatric: He has a normal mood and affect. His behavior is normal. Judgment and thought content normal.    Vital signs in last 24 hours:    Labs:   Estimated body mass index is 34.15 kg/m as calculated from the following:   Height as of 09/05/18: 5'  10" (1.778 m).   Weight as of 09/05/18: 108 kg.   Imaging Review Photographs from surgery in May reviewed with the patient and do show bare bone arthritis.   Preoperative templating of the joint replacement has been completed, documented, and submitted to the Operating Room personnel in order to optimize intra-operative equipment management.    Patient's anticipated LOS is less than 2 midnights, meeting these requirements: - Younger than 64 - Lives within 1 hour of care - Has a competent adult at home to recover with post-op recover - NO history of  - Chronic pain requiring opiods  - Diabetes  - Coronary Artery Disease  - Heart failure  - Heart attack  - Stroke  - DVT/VTE  - Cardiac arrhythmia  - Respiratory Failure/COPD  - Renal failure  - Anemia  - Advanced Liver disease        Assessment/Plan:  End stage arthritis, left knee   The patient history, physical examination, clinical judgment of the provider and imaging  studies are consistent with end stage degenerative joint disease of the left knee(s) and total knee arthroplasty is deemed medically necessary. The treatment options including medical management, injection therapy arthroscopy and arthroplasty were discussed at length. The risks and benefits of total knee arthroplasty were presented and reviewed. The risks due to aseptic loosening, infection, stiffness, patella tracking problems, thromboembolic complications and other imponderables were discussed. The patient acknowledged the explanation, agreed to proceed with the plan and consent was signed. Patient is being admitted for inpatient treatment for surgery, pain control, PT, OT, prophylactic antibiotics, VTE prophylaxis, progressive ambulation and ADL's and discharge planning. The patient is planning to be discharged home with home health services

## 2018-09-11 MED ORDER — TRANEXAMIC ACID 1000 MG/10ML IV SOLN
2000.0000 mg | INTRAVENOUS | Status: DC
  Filled 2018-09-11: qty 20

## 2018-09-11 MED ORDER — BUPIVACAINE LIPOSOME 1.3 % IJ SUSP
20.0000 mL | Freq: Once | INTRAMUSCULAR | Status: DC
  Filled 2018-09-11: qty 20

## 2018-09-12 ENCOUNTER — Encounter (HOSPITAL_COMMUNITY)
Admission: RE | Disposition: A | Discharge status: Home / Self Care | Payer: Self-pay | Source: Ambulatory Visit | Attending: Orthopedic Surgery

## 2018-09-12 ENCOUNTER — Ambulatory Visit (HOSPITAL_COMMUNITY): Admitting: Certified Registered Nurse Anesthetist

## 2018-09-12 ENCOUNTER — Ambulatory Visit (HOSPITAL_COMMUNITY)
Admission: RE | Admit: 2018-09-12 | Discharge: 2018-09-14 | Disposition: A | Discharge status: Home / Self Care | Source: Ambulatory Visit | Attending: Orthopedic Surgery | Admitting: Orthopedic Surgery

## 2018-09-12 ENCOUNTER — Encounter (HOSPITAL_COMMUNITY): Payer: Self-pay | Admitting: Emergency Medicine

## 2018-09-12 ENCOUNTER — Other Ambulatory Visit: Payer: Self-pay

## 2018-09-12 DIAGNOSIS — Z79899 Other long term (current) drug therapy: Secondary | ICD-10-CM | POA: Diagnosis not present

## 2018-09-12 DIAGNOSIS — D509 Iron deficiency anemia, unspecified: Secondary | ICD-10-CM | POA: Insufficient documentation

## 2018-09-12 DIAGNOSIS — J449 Chronic obstructive pulmonary disease, unspecified: Secondary | ICD-10-CM | POA: Diagnosis not present

## 2018-09-12 DIAGNOSIS — E785 Hyperlipidemia, unspecified: Secondary | ICD-10-CM | POA: Diagnosis not present

## 2018-09-12 DIAGNOSIS — M1712 Unilateral primary osteoarthritis, left knee: Secondary | ICD-10-CM | POA: Diagnosis present

## 2018-09-12 DIAGNOSIS — G4733 Obstructive sleep apnea (adult) (pediatric): Secondary | ICD-10-CM | POA: Diagnosis not present

## 2018-09-12 DIAGNOSIS — R195 Other fecal abnormalities: Secondary | ICD-10-CM | POA: Insufficient documentation

## 2018-09-12 DIAGNOSIS — R0989 Other specified symptoms and signs involving the circulatory and respiratory systems: Secondary | ICD-10-CM

## 2018-09-12 DIAGNOSIS — R7303 Prediabetes: Secondary | ICD-10-CM | POA: Diagnosis not present

## 2018-09-12 DIAGNOSIS — Z01818 Encounter for other preprocedural examination: Secondary | ICD-10-CM

## 2018-09-12 DIAGNOSIS — Z96652 Presence of left artificial knee joint: Secondary | ICD-10-CM

## 2018-09-12 DIAGNOSIS — R0689 Other abnormalities of breathing: Secondary | ICD-10-CM

## 2018-09-12 HISTORY — PX: TOTAL KNEE ARTHROPLASTY: SHX125

## 2018-09-12 LAB — CBC WITH DIFFERENTIAL/PLATELET
ABS IMMATURE GRANULOCYTES: 0.01 10*3/uL (ref 0.00–0.07)
BASOS PCT: 1 %
Basophils Absolute: 0 10*3/uL (ref 0.0–0.1)
EOS PCT: 5 %
Eosinophils Absolute: 0.2 10*3/uL (ref 0.0–0.5)
HCT: 37.5 % — ABNORMAL LOW (ref 39.0–52.0)
HEMOGLOBIN: 12.8 g/dL — AB (ref 13.0–17.0)
Immature Granulocytes: 0 %
LYMPHS ABS: 1.7 10*3/uL (ref 0.7–4.0)
LYMPHS PCT: 39 %
MCH: 31.7 pg (ref 26.0–34.0)
MCHC: 34.1 g/dL (ref 30.0–36.0)
MCV: 92.8 fL (ref 80.0–100.0)
MONO ABS: 0.5 10*3/uL (ref 0.1–1.0)
Monocytes Relative: 11 %
Neutro Abs: 2 10*3/uL (ref 1.7–7.7)
Neutrophils Relative %: 44 %
PLATELETS: 337 10*3/uL (ref 150–400)
RBC: 4.04 MIL/uL — AB (ref 4.22–5.81)
RDW: 13.2 % (ref 11.5–15.5)
WBC: 4.5 10*3/uL (ref 4.0–10.5)
nRBC: 0 % (ref 0.0–0.2)

## 2018-09-12 LAB — URINALYSIS, ROUTINE W REFLEX MICROSCOPIC
BILIRUBIN URINE: NEGATIVE
Bacteria, UA: NONE SEEN
Glucose, UA: NEGATIVE mg/dL
HGB URINE DIPSTICK: NEGATIVE
KETONES UR: NEGATIVE mg/dL
NITRITE: NEGATIVE
PH: 6 (ref 5.0–8.0)
Protein, ur: NEGATIVE mg/dL
SPECIFIC GRAVITY, URINE: 1.013 (ref 1.005–1.030)

## 2018-09-12 LAB — APTT: aPTT: 28 seconds (ref 24–36)

## 2018-09-12 LAB — ABO/RH: ABO/RH(D): O POS

## 2018-09-12 LAB — PROTIME-INR
INR: 0.91
PROTHROMBIN TIME: 12.2 s (ref 11.4–15.2)

## 2018-09-12 LAB — TYPE AND SCREEN
ABO/RH(D): O POS
ANTIBODY SCREEN: NEGATIVE

## 2018-09-12 SURGERY — ARTHROPLASTY, KNEE, TOTAL
Anesthesia: Spinal | Site: Knee | Laterality: Left

## 2018-09-12 MED ORDER — SODIUM CHLORIDE 0.9 % IR SOLN
Status: DC | PRN
  Administered 2018-09-12: 1000 mL

## 2018-09-12 MED ORDER — METHOCARBAMOL 500 MG PO TABS
500.0000 mg | ORAL_TABLET | Freq: Four times a day (QID) | ORAL | Status: DC | PRN
  Administered 2018-09-12 – 2018-09-14 (×4): 500 mg via ORAL
  Filled 2018-09-12 (×4): qty 1

## 2018-09-12 MED ORDER — GUAIFENESIN ER 600 MG PO TB12: 600.0000 mg | ORAL_TABLET | Freq: Two times a day (BID) | ORAL | Status: DC | PRN

## 2018-09-12 MED ORDER — PSEUDOEPHEDRINE HCL 30 MG PO TABS
30.0000 mg | ORAL_TABLET | ORAL | Status: DC | PRN
  Filled 2018-09-12: qty 1

## 2018-09-12 MED ORDER — OXYCODONE-ACETAMINOPHEN 5-325 MG PO TABS: 1.0000 | ORAL_TABLET | ORAL | 0 refills | Status: DC | PRN

## 2018-09-12 MED ORDER — SODIUM CHLORIDE 0.9 % IJ SOLN
INTRAMUSCULAR | Status: AC
  Filled 2018-09-12: qty 50

## 2018-09-12 MED ORDER — POLYETHYLENE GLYCOL 3350 17 G PO PACK: 17.0000 g | PACK | Freq: Every day | ORAL | Status: DC | PRN

## 2018-09-12 MED ORDER — DIPHENHYDRAMINE HCL 12.5 MG/5ML PO ELIX: 12.5000 mg | ORAL_SOLUTION | ORAL | Status: DC | PRN

## 2018-09-12 MED ORDER — METOCLOPRAMIDE HCL 5 MG PO TABS: 5.0000 mg | ORAL_TABLET | Freq: Three times a day (TID) | ORAL | Status: DC | PRN

## 2018-09-12 MED ORDER — ASPIRIN 81 MG PO CHEW
81.0000 mg | CHEWABLE_TABLET | Freq: Two times a day (BID) | ORAL | Status: DC
  Administered 2018-09-12 – 2018-09-14 (×4): 81 mg via ORAL
  Filled 2018-09-12 (×4): qty 1

## 2018-09-12 MED ORDER — TIZANIDINE HCL 2 MG PO TABS: 2.0000 mg | ORAL_TABLET | Freq: Four times a day (QID) | ORAL | 0 refills | Status: DC | PRN

## 2018-09-12 MED ORDER — FERROUS SULFATE 325 (65 FE) MG PO TABS
325.0000 mg | ORAL_TABLET | Freq: Two times a day (BID) | ORAL | Status: DC
  Administered 2018-09-12 – 2018-09-14 (×3): 325 mg via ORAL
  Filled 2018-09-12 (×3): qty 1

## 2018-09-12 MED ORDER — WATER FOR IRRIGATION, STERILE IR SOLN
Status: DC | PRN
  Administered 2018-09-12: 2000 mL via SURGICAL_CAVITY

## 2018-09-12 MED ORDER — ONDANSETRON HCL 4 MG/2ML IJ SOLN
4.0000 mg | Freq: Four times a day (QID) | INTRAMUSCULAR | Status: DC | PRN
  Administered 2018-09-13: 4 mg via INTRAVENOUS
  Filled 2018-09-12: qty 2

## 2018-09-12 MED ORDER — ACETAMINOPHEN 325 MG PO TABS
325.0000 mg | ORAL_TABLET | Freq: Four times a day (QID) | ORAL | Status: DC | PRN
  Administered 2018-09-13 – 2018-09-14 (×6): 650 mg via ORAL
  Filled 2018-09-12 (×6): qty 2

## 2018-09-12 MED ORDER — GABAPENTIN 300 MG PO CAPS
300.0000 mg | ORAL_CAPSULE | Freq: Three times a day (TID) | ORAL | Status: DC
  Administered 2018-09-12 – 2018-09-14 (×4): 300 mg via ORAL
  Filled 2018-09-12 (×4): qty 1

## 2018-09-12 MED ORDER — ALBUTEROL SULFATE HFA 108 (90 BASE) MCG/ACT IN AERS
INHALATION_SPRAY | RESPIRATORY_TRACT | Status: AC
  Filled 2018-09-12: qty 6.7

## 2018-09-12 MED ORDER — ONDANSETRON HCL 4 MG/2ML IJ SOLN
INTRAMUSCULAR | Status: DC | PRN
  Administered 2018-09-12: 4 mg via INTRAVENOUS

## 2018-09-12 MED ORDER — BISACODYL 5 MG PO TBEC: 5.0000 mg | DELAYED_RELEASE_TABLET | Freq: Every day | ORAL | Status: DC | PRN

## 2018-09-12 MED ORDER — SODIUM CHLORIDE 0.9 % IJ SOLN
INTRAMUSCULAR | Status: DC | PRN
  Administered 2018-09-12: 50 mL

## 2018-09-12 MED ORDER — ALBUTEROL SULFATE (2.5 MG/3ML) 0.083% IN NEBU
3.0000 mL | INHALATION_SOLUTION | RESPIRATORY_TRACT | Status: DC | PRN
  Filled 2018-09-12: qty 3

## 2018-09-12 MED ORDER — BUPIVACAINE-EPINEPHRINE (PF) 0.25% -1:200000 IJ SOLN
INTRAMUSCULAR | Status: DC | PRN
  Administered 2018-09-12: 30 mL

## 2018-09-12 MED ORDER — FENTANYL CITRATE (PF) 100 MCG/2ML IJ SOLN
INTRAMUSCULAR | Status: AC
  Filled 2018-09-12: qty 2

## 2018-09-12 MED ORDER — BUPIVACAINE LIPOSOME 1.3 % IJ SUSP
INTRAMUSCULAR | Status: DC | PRN
  Administered 2018-09-12: 20 mL

## 2018-09-12 MED ORDER — PROPOFOL 10 MG/ML IV BOLUS
INTRAVENOUS | Status: AC
  Filled 2018-09-12: qty 20

## 2018-09-12 MED ORDER — MENTHOL 3 MG MT LOZG: 1.0000 | LOZENGE | OROMUCOSAL | Status: DC | PRN

## 2018-09-12 MED ORDER — BUPIVACAINE-EPINEPHRINE (PF) 0.25% -1:200000 IJ SOLN
INTRAMUSCULAR | Status: AC
  Filled 2018-09-12: qty 30

## 2018-09-12 MED ORDER — CEFAZOLIN SODIUM-DEXTROSE 2-4 GM/100ML-% IV SOLN
2.0000 g | INTRAVENOUS | Status: AC
  Administered 2018-09-12: 2 g via INTRAVENOUS
  Filled 2018-09-12: qty 100

## 2018-09-12 MED ORDER — CHLORHEXIDINE GLUCONATE 4 % EX LIQD: 60.0000 mL | Freq: Once | CUTANEOUS | Status: DC

## 2018-09-12 MED ORDER — PROPOFOL 500 MG/50ML IV EMUL
INTRAVENOUS | Status: DC | PRN
  Administered 2018-09-12: 75 ug/kg/min via INTRAVENOUS

## 2018-09-12 MED ORDER — DOCUSATE SODIUM 100 MG PO CAPS
100.0000 mg | ORAL_CAPSULE | Freq: Two times a day (BID) | ORAL | Status: DC
  Administered 2018-09-12 – 2018-09-14 (×4): 100 mg via ORAL
  Filled 2018-09-12 (×4): qty 1

## 2018-09-12 MED ORDER — TRANEXAMIC ACID-NACL 1000-0.7 MG/100ML-% IV SOLN
1000.0000 mg | INTRAVENOUS | Status: AC
  Administered 2018-09-12: 1000 mg via INTRAVENOUS
  Filled 2018-09-12: qty 100

## 2018-09-12 MED ORDER — ALUM & MAG HYDROXIDE-SIMETH 200-200-20 MG/5ML PO SUSP: 30.0000 mL | ORAL | Status: DC | PRN

## 2018-09-12 MED ORDER — METOCLOPRAMIDE HCL 5 MG/ML IJ SOLN: 5.0000 mg | Freq: Three times a day (TID) | INTRAMUSCULAR | Status: DC | PRN

## 2018-09-12 MED ORDER — PANTOPRAZOLE SODIUM 40 MG PO TBEC
40.0000 mg | DELAYED_RELEASE_TABLET | Freq: Every day | ORAL | Status: DC
  Administered 2018-09-12 – 2018-09-14 (×3): 40 mg via ORAL
  Filled 2018-09-12 (×3): qty 1

## 2018-09-12 MED ORDER — MIDAZOLAM HCL 2 MG/2ML IJ SOLN
INTRAMUSCULAR | Status: AC
  Filled 2018-09-12: qty 2

## 2018-09-12 MED ORDER — BUPIVACAINE HCL (PF) 0.5 % IJ SOLN
INTRAMUSCULAR | Status: AC
  Filled 2018-09-12: qty 30

## 2018-09-12 MED ORDER — PROPOFOL 500 MG/50ML IV EMUL
INTRAVENOUS | Status: DC | PRN
  Administered 2018-09-12: 25 mg via INTRAVENOUS
  Administered 2018-09-12: 15 mg via INTRAVENOUS

## 2018-09-12 MED ORDER — FENTANYL CITRATE (PF) 100 MCG/2ML IJ SOLN
50.0000 ug | INTRAMUSCULAR | Status: DC
  Administered 2018-09-12: 100 ug via INTRAVENOUS
  Filled 2018-09-12: qty 2

## 2018-09-12 MED ORDER — ONDANSETRON HCL 4 MG PO TABS
4.0000 mg | ORAL_TABLET | Freq: Four times a day (QID) | ORAL | Status: DC | PRN
  Administered 2018-09-13: 4 mg via ORAL
  Filled 2018-09-12: qty 1

## 2018-09-12 MED ORDER — OXYCODONE HCL 5 MG PO TABS
5.0000 mg | ORAL_TABLET | ORAL | Status: DC | PRN
  Administered 2018-09-12 (×2): 5 mg via ORAL
  Administered 2018-09-12 – 2018-09-13 (×3): 10 mg via ORAL
  Administered 2018-09-14: 5 mg via ORAL
  Filled 2018-09-12: qty 2
  Filled 2018-09-12: qty 1
  Filled 2018-09-12: qty 2
  Filled 2018-09-12 (×2): qty 1
  Filled 2018-09-12: qty 2

## 2018-09-12 MED ORDER — FLEET ENEMA 7-19 GM/118ML RE ENEM: 1.0000 | ENEMA | Freq: Once | RECTAL | Status: DC | PRN

## 2018-09-12 MED ORDER — TRANEXAMIC ACID 1000 MG/10ML IV SOLN
INTRAVENOUS | Status: DC | PRN
  Administered 2018-09-12: 2000 mg via TOPICAL

## 2018-09-12 MED ORDER — BUPIVACAINE IN DEXTROSE 0.75-8.25 % IT SOLN
INTRATHECAL | Status: DC | PRN
  Administered 2018-09-12: 1.6 mL via INTRATHECAL

## 2018-09-12 MED ORDER — ONDANSETRON HCL 4 MG/2ML IJ SOLN
INTRAMUSCULAR | Status: AC
  Filled 2018-09-12: qty 2

## 2018-09-12 MED ORDER — HYDROMORPHONE HCL 1 MG/ML IJ SOLN
0.5000 mg | INTRAMUSCULAR | Status: DC | PRN
  Administered 2018-09-12: 1 mg via INTRAVENOUS
  Filled 2018-09-12: qty 1

## 2018-09-12 MED ORDER — ASPIRIN EC 81 MG PO TBEC: 81.0000 mg | DELAYED_RELEASE_TABLET | Freq: Two times a day (BID) | ORAL | 0 refills | Status: DC

## 2018-09-12 MED ORDER — DEXAMETHASONE SODIUM PHOSPHATE 10 MG/ML IJ SOLN
10.0000 mg | Freq: Once | INTRAMUSCULAR | Status: AC
  Administered 2018-09-13: 10 mg via INTRAVENOUS
  Filled 2018-09-12: qty 1

## 2018-09-12 MED ORDER — PHENOL 1.4 % MT LIQD
1.0000 | OROMUCOSAL | Status: DC | PRN
  Filled 2018-09-12: qty 177

## 2018-09-12 MED ORDER — MIDAZOLAM HCL 2 MG/2ML IJ SOLN
1.0000 mg | INTRAMUSCULAR | Status: DC
  Administered 2018-09-12: 2 mg via INTRAVENOUS
  Filled 2018-09-12: qty 2

## 2018-09-12 MED ORDER — LACTATED RINGERS IV SOLN
INTRAVENOUS | Status: DC
  Administered 2018-09-12 (×2): via INTRAVENOUS

## 2018-09-12 MED ORDER — METHOCARBAMOL 500 MG IVPB - SIMPLE MED
500.0000 mg | Freq: Four times a day (QID) | INTRAVENOUS | Status: DC | PRN
  Filled 2018-09-12: qty 50

## 2018-09-12 MED ORDER — KCL IN DEXTROSE-NACL 20-5-0.45 MEQ/L-%-% IV SOLN
INTRAVENOUS | Status: DC
  Administered 2018-09-12 – 2018-09-13 (×2): via INTRAVENOUS
  Filled 2018-09-12 (×5): qty 1000

## 2018-09-12 MED ORDER — TRANEXAMIC ACID-NACL 1000-0.7 MG/100ML-% IV SOLN
1000.0000 mg | Freq: Once | INTRAVENOUS | Status: AC
  Administered 2018-09-12: 1000 mg via INTRAVENOUS
  Filled 2018-09-12: qty 100

## 2018-09-12 MED ORDER — TRANEXAMIC ACID-NACL 1000-0.7 MG/100ML-% IV SOLN: 1000.0000 mg | INTRAVENOUS | Status: DC

## 2018-09-12 MED ORDER — ROPIVACAINE HCL 5 MG/ML IJ SOLN
INTRAMUSCULAR | Status: DC | PRN
  Administered 2018-09-12: 30 mL via PERINEURAL

## 2018-09-12 MED ORDER — PROPOFOL 10 MG/ML IV BOLUS
INTRAVENOUS | Status: AC
  Filled 2018-09-12: qty 40

## 2018-09-12 SURGICAL SUPPLY — 58 items
ATTUNE MED DOME PAT 41 KNEE (Knees) ×2 IMPLANT
ATTUNE PS FEM LT SZ 7 CEM KNEE (Femur) ×2 IMPLANT
ATTUNE PSRP INSR SZ7 5 KNEE (Insert) ×2 IMPLANT
BAG DECANTER FOR FLEXI CONT (MISCELLANEOUS) ×2 IMPLANT
BAG ZIPLOCK 12X15 (MISCELLANEOUS) ×2 IMPLANT
BASE TIBIAL ROT PLAT SZ 8 KNEE (Knees) ×1 IMPLANT
BLADE OSC SAW 13X1.19X90 (BLADE) ×1 IMPLANT
BLADE SAG 18X100X1.27 (BLADE) IMPLANT
BLADE SAW SGTL 11.0X1.19X90.0M (BLADE) IMPLANT
BLADE SAW SGTL 83.5X18.5 (BLADE) ×2 IMPLANT
BNDG ELASTIC 6X10 VLCR STRL LF (GAUZE/BANDAGES/DRESSINGS) ×2 IMPLANT
BOWL SMART MIX CTS (DISPOSABLE) ×2 IMPLANT
CEMENT HV SMART SET (Cement) ×4 IMPLANT
COVER SURGICAL LIGHT HANDLE (MISCELLANEOUS) ×2 IMPLANT
COVER WAND RF STERILE (DRAPES) ×2 IMPLANT
CUFF TOURN SGL QUICK 34 (TOURNIQUET CUFF) ×1
CUFF TRNQT CYL 34X4X40X1 (TOURNIQUET CUFF) ×1 IMPLANT
DECANTER SPIKE VIAL GLASS SM (MISCELLANEOUS) ×6 IMPLANT
DRAPE U-SHAPE 47X51 STRL (DRAPES) ×2 IMPLANT
DRESSING AQUACEL AG SP 3.5X10 (GAUZE/BANDAGES/DRESSINGS) ×1 IMPLANT
DRSG AQUACEL AG SP 3.5X10 (GAUZE/BANDAGES/DRESSINGS) ×2
DURAPREP 26ML APPLICATOR (WOUND CARE) ×2 IMPLANT
ELECT REM PT RETURN 15FT ADLT (MISCELLANEOUS) ×2 IMPLANT
GLOVE BIO SURGEON STRL SZ7.5 (GLOVE) ×4 IMPLANT
GLOVE BIO SURGEON STRL SZ8.5 (GLOVE) ×2 IMPLANT
GLOVE BIOGEL M STRL SZ7.5 (GLOVE) ×4 IMPLANT
GLOVE BIOGEL PI IND STRL 7.5 (GLOVE) ×4 IMPLANT
GLOVE BIOGEL PI IND STRL 8 (GLOVE) ×1 IMPLANT
GLOVE BIOGEL PI IND STRL 9 (GLOVE) ×1 IMPLANT
GLOVE BIOGEL PI INDICATOR 7.5 (GLOVE) ×4
GLOVE BIOGEL PI INDICATOR 8 (GLOVE) ×1
GLOVE BIOGEL PI INDICATOR 9 (GLOVE) ×1
GOWN SPEC L3 XXLG W/TWL (GOWN DISPOSABLE) ×2 IMPLANT
GOWN STRL REUS W/TWL XL LVL3 (GOWN DISPOSABLE) ×6 IMPLANT
HANDPIECE INTERPULSE COAX TIP (DISPOSABLE) ×1
HOOD PEEL AWAY FLYTE STAYCOOL (MISCELLANEOUS) ×6 IMPLANT
IV KIT MINILOC 20X1 SAFETY (NEEDLE) ×2 IMPLANT
NEEDLE HYPO 21X1.5 SAFETY (NEEDLE) ×4 IMPLANT
OSC SAW BLADE 13X1.19X90 (BLADE) ×2
PACK ICE MAXI GEL EZY WRAP (MISCELLANEOUS) ×2 IMPLANT
PACK TOTAL KNEE CUSTOM (KITS) ×2 IMPLANT
PIN STEINMAN FIXATION KNEE (PIN) ×2 IMPLANT
PIN THREADED HEADED SIGMA (PIN) ×2 IMPLANT
POSITIONER SURGICAL ARM (MISCELLANEOUS) ×2 IMPLANT
SET HNDPC FAN SPRY TIP SCT (DISPOSABLE) ×1 IMPLANT
SPONGE LAP 18X18 X RAY DECT (DISPOSABLE) ×6 IMPLANT
STAPLER VISISTAT 35W (STAPLE) IMPLANT
SUT VIC AB 1 CTX 36 (SUTURE) ×1
SUT VIC AB 1 CTX36XBRD ANBCTR (SUTURE) ×1 IMPLANT
SUT VIC AB 2-0 CT1 27 (SUTURE) ×1
SUT VIC AB 2-0 CT1 TAPERPNT 27 (SUTURE) ×1 IMPLANT
SUT VIC AB 3-0 CT1 27 (SUTURE) ×1
SUT VIC AB 3-0 CT1 TAPERPNT 27 (SUTURE) ×1 IMPLANT
SYR CONTROL 10ML LL (SYRINGE) ×4 IMPLANT
TIBIAL BASE ROT PLAT SZ 8 KNEE (Knees) ×2 IMPLANT
TRAY FOLEY MTR SLVR 16FR STAT (SET/KITS/TRAYS/PACK) ×2 IMPLANT
WRAP KNEE MAXI GEL POST OP (GAUZE/BANDAGES/DRESSINGS) ×2 IMPLANT
YANKAUER SUCT BULB TIP 10FT TU (MISCELLANEOUS) ×2 IMPLANT

## 2018-09-12 NOTE — Anesthesia Preprocedure Evaluation (Addendum)
Anesthesia Evaluation  Patient identified by MRN, date of birth, ID band Patient awake    Reviewed: Allergy & Precautions, NPO status , Patient's Chart, lab work & pertinent test results  History of Anesthesia Complications Negative for: history of anesthetic complications  Airway Mallampati: II  TM Distance: >3 FB Neck ROM: Full    Dental no notable dental hx.    Pulmonary sleep apnea , COPD,    Pulmonary exam normal        Cardiovascular negative cardio ROS Normal cardiovascular exam     Neuro/Psych negative neurological ROS  negative psych ROS   GI/Hepatic negative GI ROS, Neg liver ROS,   Endo/Other  negative endocrine ROS  Renal/GU negative Renal ROS  negative genitourinary   Musculoskeletal  (+) Arthritis , Osteoarthritis,    Abdominal   Peds  Hematology negative hematology ROS (+)   Anesthesia Other Findings TTE 07/26/17: Study Conclusions - Left ventricle: The cavity size was normal. Wall thickness was   increased in a pattern of mild LVH. Systolic function was normal.   The estimated ejection fraction was in the range of 55% to 60%.   Wall motion was normal; there were no regional wall motion   abnormalities. Left ventricular diastolic function parameters   were normal for the patient&'s age. - Aortic valve: There was trivial regurgitation. - Mitral valve: There was trivial regurgitation. - Right atrium: Central venous pressure (est): 3 mm Hg. - Atrial septum: No defect or patent foramen ovale was identified. - Tricuspid valve: There was mild regurgitation. - Pulmonary arteries: PA peak pressure: 23 mm Hg (S). - Pericardium, extracardiac: There was no pericardial effusion.  Impressions: - Mild LVH with LVEF 55-60% and grossly normal diastolic function.   Trivial mitral regurgitation. Trivial aortic regurgitation. Mild   tricuspid regurgitation with estimated PASP 23 mmHg.  Reproductive/Obstetrics                           Anesthesia Physical Anesthesia Plan  ASA: II  Anesthesia Plan: Spinal   Post-op Pain Management:  Regional for Post-op pain   Induction:   PONV Risk Score and Plan: 1 and Propofol infusion and Treatment may vary due to age or medical condition  Airway Management Planned: Natural Airway, Nasal Cannula and Simple Face Mask  Additional Equipment: None  Intra-op Plan:   Post-operative Plan:   Informed Consent: I have reviewed the patients History and Physical, chart, labs and discussed the procedure including the risks, benefits and alternatives for the proposed anesthesia with the patient or authorized representative who has indicated his/her understanding and acceptance.     Plan Discussed with:   Anesthesia Plan Comments:        Anesthesia Quick Evaluation

## 2018-09-12 NOTE — Op Note (Signed)
PATIENT ID:      Jacob Hanna  MRN:     [id number] DOB/AGE:    56-Apr-1962 / 56 y.o.       OPERATIVE REPORT    DATE OF PROCEDURE:  09/12/2018       PREOPERATIVE DIAGNOSIS:   LEFT KNEE OSTEOARTHRITIS      Estimated body mass index is 34.15 kg/m as calculated from the following:   Height as of this encounter: 5\' 10"  (1.778 m).   Weight as of this encounter: 108 kg.                                                        POSTOPERATIVE DIAGNOSIS:   LEFT KNEE OSTEOARTHRITIS                                                                      PROCEDURE:  Procedure(s): LEFT TOTAL KNEE ARTHROPLASTY Using DepuyAttune RP implants #7L Femur, #8Tibia, 5 mm Attune RP bearing, 41 Patella     SURGEON: Nestor Lewandowsky    ASSISTANT:   Tomi Likens. [employer]   (Present and scrubbed throughout the case, critical for assistance with exposure, retraction, instrumentation, and closure.)         ANESTHESIA: Spinal, 20cc Exparel, 50cc 0.25% Marcaine  EBL: 250cc cc  FLUID REPLACEMENT: 1500 cc crystaloid  Tourniquet Time: None  Drains: None  Tranexamic Acid: 1gm IV, 2gm topical  Exparel: 266mg    COMPLICATIONS:  None         INDICATIONS FOR PROCEDURE: The patient has  LEFT KNEE OSTEOARTHRITIS, Var deformities, XR shows bone on bone arthritis, lateral subluxation of tibia. Patient has failed all conservative measures including anti-inflammatory medicines, narcotics, attempts at exercise and weight loss, cortisone injections and viscosupplementation.  Risks and benefits of surgery have been discussed, questions answered.   DESCRIPTION OF PROCEDURE: The patient identified by armband, received  IV antibiotics, in the holding area at [facility]. Patient taken to the operating room, appropriate anesthetic monitors were attached, and Spinal anesthesia was  induced. IV Tranexamic acid was given.Tourniquet applied high to the operative thigh. Lateral post and foot positioner applied to the table, the lower  extremity was then prepped and draped in usual sterile fashion from the toes to the tourniquet. Time-out procedure was performed. The skin and subcutaneous tissue along the incision was injected with 20 cc of a mixture of Exparel and Marcaine solution, using a 20-gauge by 1-1/2 inch needle. We began the operation, with the knee flexed 130 degrees, by making the anterior midline incision starting at handbreadth above the patella going over the patella 1 cm medial to and 4 cm distal to the tibial tubercle. Small bleeders in the skin and the subcutaneous tissue identified and cauterized. Transverse retinaculum was incised and reflected medially and a medial parapatellar arthrotomy was accomplished. the patella was everted and theprepatellar fat pad resected. The superficial medial collateral ligament was then elevated from anterior to posterior along the proximal flare of the tibia and anterior half of the menisci resected. The knee was hyperflexed exposing  bone on bone arthritis. Peripheral and notch osteophytes as well as the cruciate ligaments were then resected. We continued to work our way around posteriorly along the proximal tibia, and externally rotated the tibia subluxing it out from underneath the femur. A McHale retractor was placed through the notch and a lateral Hohmann retractor placed, and we then drilled through the proximal tibia in line with the axis of the tibia followed by an intramedullary guide rod and 2-degree posterior slope cutting guide. The tibial cutting guide, 4 degree posterior sloped, was pinned into place allowing resection of 5 mm of bone medially and 12 mm of bone laterally. Satisfied with the tibial resection, we then entered the distal femur 2 mm anterior to the PCL origin with the intramedullary guide rod and applied the distal femoral cutting guide set at 9 mm, with 5 degrees of valgus. This was pinned along the epicondylar axis. At this point, the distal femoral cut was  accomplished without difficulty. We then sized for a #7L femoral component and pinned the guide in 3 degrees of external rotation. The chamfer cutting guide was pinned into place. The anterior, posterior, and chamfer cuts were accomplished without difficulty followed by the Attune RP box cutting guide and the box cut. We also removed posterior osteophytes from the posterior femoral condyles. The posterior capsule was injected with Exparel solution. The knee was brought into full extension. We checked our extension gap and fit a 5 mm bearing. Distracting in extension with a lamina spreader,  bleeders in the posterior capsule, Posterior medial and posterior lateral right down and cauterized.  The transexamic acid-soaked sponge was then placed in the gap of the knee and extension. The knee was flexed 30. The posterior patella cut was accomplished with the 9.5 mm Attune cutting guide, sized for a 41mm dome, and the fixation pegs drilled.The knee was then once again hyperflexed exposing the proximal tibia. We sized for a # 8 tibial base plate, applied the smokestack and the conical reamer followed by the the Delta fin keel punch. We then hammered into place the Attune RP trial femoral component, drilled the lugs, inserted a  5 mm trial bearing, trial patellar button, and took the knee through range of motion from 0-130 degrees. Medial and lateral ligamentous stability was checked. No thumb pressure was required for patellar Tracking. The tourniquet was not used. All trial components were removed, mating surfaces irrigated with pulse lavage, and dried with suction and sponges. 10 cc of the Exparel solution was applied to the cancellus bone of the patella distal femur and proximal tibia.  After waiting 30 seconds, the bony surfaces were again, dried with sponges. A double batch of DePuy HV cement was mixed and applied to all bony metallic mating surfaces except for the posterior condyles of the femur itself. In order, we  hammered into place the tibial tray and removed excess cement, the femoral component and removed excess cement. The final Attune RP bearing was inserted, and the knee brought to full extension with compression. The patellar button was clamped into place, and excess cement removed. The knee was held at 30 flexion with compression, while the cement cured. The wound was irrigated out with normal saline solution pulse lavage. The rest of the Exparel was injected into the parapatellar arthrotomy, subcutaneous tissues, and periosteal tissues. The parapatellar arthrotomy was closed with running #1 Vicryl suture. The subcutaneous tissue with 0 and 2-0 undyed Vicryl suture, and the skin with running 3-0 SQ vicryl. An Aquacil  and Ace wrap were applied. The patient was taken to recovery room without difficulty.   Nestor Lewandowsky 09/12/2018, 3:06 PM

## 2018-09-12 NOTE — Evaluation (Signed)
Physical Therapy Evaluation Patient Details Name: Jacob Hanna MRN: [id number] DOB: Dec 02, 1960 Today's Date: 09/12/2018   History of Present Illness  57 YO male s/p L TKR on 09/12/18. PMH includes COPD, OSA, preDM, HLD, L medial meniscus tear and multiple arthroscopies 2014-2019, GI bleed, L chondroplasty.     Clinical Impression  Pt presents with severe L knee pain, difficulty performing mobility tasks due to L knee pain, and anxiety with mobility. Pt's pain improved in static and dynamic standing. Pt to benefit from acute PT to address deficits. Pt ambulated 25 ft with RW with min guard assist. PT limited pt in ambulation distance due to high level of pain, but pt felt relief in constant L knee pain with walking. RN notified of pt's high pain level. PT to progress mobility as tolerated, and will continue to follow acutely.      Follow Up Recommendations Follow surgeon's recommendation for DC plan and follow-up therapies;Supervision for mobility/OOB(HHPT )    Equipment Recommendations  None recommended by PT    Recommendations for Other Services       Precautions / Restrictions Precautions Precautions: Fall Restrictions Weight Bearing Restrictions: No Other Position/Activity Restrictions: WBAT       Mobility  Bed Mobility Overal bed mobility: Needs Assistance Bed Mobility: Supine to Sit     Supine to sit: Min assist;HOB elevated     General bed mobility comments: Pt with very high pain level, but wanting to mobilize to change his position to see if pain improved. Min assist for LLE management. Increased time and effort, limited by pain.   Transfers Overall transfer level: Needs assistance Equipment used: Rolling walker (2 wheeled) Transfers: Sit to/from Stand Sit to Stand: Min assist;From elevated surface         General transfer comment: Min assist for steadying upon standing, verbal cuing for hand placement on RW. Pt with weight shifting over LLE and reported  relief of pain in standing position.   Ambulation/Gait Ambulation/Gait assistance: Min guard Gait Distance (Feet): 25 Feet Assistive device: Rolling walker (2 wheeled) Gait Pattern/deviations: Step-to pattern;Decreased stride length;Antalgic Gait velocity: decr    General Gait Details: PT limited pt's ambulation distance given pt's high pain level. Verbal cuing for placement in RW, sequencing. Pt again noting decreased pain in standing as opposed to sitting/supine positioning.   Stairs            Wheelchair Mobility    Modified Rankin (Stroke Patients Only)       Balance Overall balance assessment: Mild deficits observed, not formally tested                                           Pertinent Vitals/Pain Pain Assessment: 0-10 Pain Score: 8  Pain Location: L knee, with ambulation  Pain Descriptors / Indicators: Constant Pain Intervention(s): Limited activity within patient's tolerance;Repositioned;Monitored during session;Premedicated before session;RN gave pain meds during session    Home Living Family/patient expects to be discharged to:: Private residence Living Arrangements: Spouse/significant other;Children Available Help at Discharge: Family;Available PRN/intermittently Type of Home: House Home Access: Ramped entrance     Home Layout: One level Home Equipment: Emergency planning/management officer - 2 wheels;Crutches;Cane - single point      Prior Function Level of Independence: Independent               Hand Dominance  Extremity/Trunk Assessment   Upper Extremity Assessment Upper Extremity Assessment: Overall WFL for tasks assessed    Lower Extremity Assessment Lower Extremity Assessment: Overall WFL for tasks assessed;LLE deficits/detail LLE Deficits / Details: Limited in assessment of pt's LLE strength due to pain; pt able to perform ankle pumps, scoot to EOB with assist  LLE: Unable to fully assess due to pain    Cervical / Trunk  Assessment Cervical / Trunk Assessment: Normal  Communication   Communication: No difficulties  Cognition Arousal/Alertness: Awake/alert Behavior During Therapy: Anxious Overall Cognitive Status: Within Functional Limits for tasks assessed                                        General Comments      Exercises Total Joint Exercises Ankle Circles/Pumps: AROM;Both;5 reps;Seated Goniometric ROM: unable to assess due to pt's high pain level    Assessment/Plan    PT Assessment Patient needs continued PT services  PT Problem List Decreased strength;Pain;Decreased range of motion;Decreased activity tolerance;Decreased knowledge of use of DME;Decreased balance;Decreased mobility       PT Treatment Interventions DME instruction;Therapeutic activities;Gait training;Therapeutic exercise;Patient/family education;Balance training;Functional mobility training    PT Goals (Current goals can be found in the Care Plan section)  Acute Rehab PT Goals Patient Stated Goal: decrease pain  PT Goal Formulation: With patient Time For Goal Achievement: 09/19/18 Potential to Achieve Goals: Good    Frequency 7X/week   Barriers to discharge        Co-evaluation               AM-PAC PT "6 Clicks" Daily Activity  Outcome Measure Difficulty turning over in bed (including adjusting bedclothes, sheets and blankets)?: Unable Difficulty moving from lying on back to sitting on the side of the bed? : Unable Difficulty sitting down on and standing up from a chair with arms (e.g., wheelchair, bedside commode, etc,.)?: Unable Help needed moving to and from a bed to chair (including a wheelchair)?: A Little Help needed walking in hospital room?: A Little Help needed climbing 3-5 steps with a railing? : A Lot 6 Click Score: 11    End of Session Equipment Utilized During Treatment: Gait belt Activity Tolerance: Patient limited by pain Patient left: in chair;with call bell/phone within  reach;with family/visitor present;with SCD's reapplied(Pt agrees to call for assist when mobilizing, wife very adament that pt receive assist with mobility ) Nurse Communication: Mobility status;Patient requests pain meds PT Visit Diagnosis: Other abnormalities of gait and mobility (R26.89);Pain Pain - Right/Left: Left Pain - part of body: Knee    Time: 1845-1910 PT Time Calculation (min) (ACUTE ONLY): 25 min   Charges:   PT Evaluation $PT Eval Low Complexity: 1 Low PT Treatments $Gait Training: 8-22 mins       Nicola Police, PT Acute Rehabilitation Services Pager [phone]  Office [phone]   Roseline Ebarb D Perris Conwell 09/12/2018, 7:30 PM

## 2018-09-12 NOTE — Anesthesia Procedure Notes (Signed)
Anesthesia Regional Block: Adductor canal block   Pre-Anesthetic Checklist: ,, timeout performed, Correct Patient, Correct Site, Correct Laterality, Correct Procedure, Correct Position, site marked, Risks and benefits discussed,  Surgical consent,  Pre-op evaluation,  At surgeon's request and post-op pain management  Laterality: Left  Prep: chloraprep       Needles:  Injection technique: Single-shot  Needle Type: Echogenic Stimulator Needle     Needle Length: 10cm  Needle Gauge: 21     Additional Needles:   Procedures:,,,, ultrasound used (permanent image in chart),,,,  Narrative:  Start time: 09/12/2018 1:00 PM End time: 09/12/2018 1:15 PM Injection made incrementally with aspirations every 5 mL.  Performed by: Personally  Anesthesiologist: Lucretia Kern, MD  Additional Notes: Monitors applied. Injection made in 5cc increments. No resistance to injection. Good needle visualization. Patient tolerated procedure well.

## 2018-09-12 NOTE — Interval H&P Note (Signed)
History and Physical Interval Note:  09/12/2018 12:33 PM  Jacob Hanna  has presented today for surgery, with the diagnosis of LEFT KNEE OSTEOARTHRITIS  The various methods of treatment have been discussed with the patient and family. After consideration of risks, benefits and other options for treatment, the patient has consented to  Procedure(s): LEFT TOTAL KNEE ARTHROPLASTY (Left) as a surgical intervention .  The patient's history has been reviewed, patient examined, no change in status, stable for surgery.  I have reviewed the patient's chart and labs.  Questions were answered to the patient's satisfaction.     Nestor Lewandowsky

## 2018-09-12 NOTE — Progress Notes (Signed)
AssistedDr. Witman with left, ultrasound guided, adductor canal block. Side rails up, monitors on throughout procedure. See vital signs in flow sheet. Tolerated Procedure well.  

## 2018-09-12 NOTE — Anesthesia Procedure Notes (Signed)
Spinal  Patient location during procedure: OR Start time: 09/12/2018 1:39 PM End time: 09/12/2018 1:43 PM Staffing Anesthesiologist: Lidia Collum, MD Resident/CRNA: Glory Buff, CRNA Performed: resident/CRNA  Preanesthetic Checklist Completed: patient identified, site marked, surgical consent, pre-op evaluation, timeout performed, IV checked, risks and benefits discussed and monitors and equipment checked Spinal Block Patient position: sitting Prep: DuraPrep Patient monitoring: heart rate, continuous pulse ox and blood pressure Approach: midline Location: L3-4 Injection technique: single-shot Needle Needle type: Pencan  Needle gauge: 24 G Needle length: 9 cm Needle insertion depth: 6 cm Assessment Sensory level: T6 Additional Notes Kit expiration date checked and verified.  Sterile prep and drape, skin local with 1% lidocaine, - heme, - paraesthesia, +CSF pre and post injection.  Patient tolerated procedure well.

## 2018-09-12 NOTE — Anesthesia Postprocedure Evaluation (Signed)
Anesthesia Post Note  Patient: JERMANE BRAYBOY  Procedure(s) Performed: LEFT TOTAL KNEE ARTHROPLASTY (Left Knee)     Patient location during evaluation: PACU Anesthesia Type: Spinal Level of consciousness: oriented and awake and alert Pain management: pain level controlled Vital Signs Assessment: post-procedure vital signs reviewed and stable Respiratory status: spontaneous breathing, respiratory function stable and patient connected to nasal cannula oxygen Cardiovascular status: blood pressure returned to baseline and stable Postop Assessment: no headache, no backache, no apparent nausea or vomiting and spinal receding Anesthetic complications: no    Last Vitals:  Vitals:   09/12/18 1600 09/12/18 1615  BP: 110/67 112/77  Pulse: (!) 55 (!) 56  Resp: 12 10  Temp:    SpO2: 100% 99%    Last Pain:  Vitals:   09/12/18 1615  TempSrc:   PainSc: 0-No pain    LLE Motor Response: Purposeful movement (09/12/18 1615) LLE Sensation: Numbness;Tingling (09/12/18 1615) RLE Motor Response: Purposeful movement (09/12/18 1615) RLE Sensation: Tingling (09/12/18 1615) L Sensory Level: S1-Sole of foot, small toes (09/12/18 1615) R Sensory Level: S1-Sole of foot, small toes (09/12/18 1615)  Lucretia Kern

## 2018-09-12 NOTE — Progress Notes (Signed)
No EKG or CXR needed per Dr. Clemens Catholic. Orders discontinued.

## 2018-09-12 NOTE — Transfer of Care (Signed)
Immediate Anesthesia Transfer of Care Note  Patient: Jacob Hanna  Procedure(s) Performed: LEFT TOTAL KNEE ARTHROPLASTY (Left Knee)  Patient Location: PACU  Anesthesia Type:MAC and Spinal  Level of Consciousness: awake, alert  and oriented  Airway & Oxygen Therapy: Patient Spontanous Breathing and Patient connected to face mask oxygen  Post-op Assessment: Report given to RN and Post -op Vital signs reviewed and stable  Post vital signs: Reviewed and stable  Last Vitals:  Vitals Value Taken Time  BP    Temp    Pulse 57 09/12/2018  3:41 PM  Resp 14 09/12/2018  3:41 PM  SpO2 100 % 09/12/2018  3:41 PM  Vitals shown include unvalidated device data.  Last Pain:  Vitals:   09/12/18 1224  TempSrc:   PainSc: 5       Patients Stated Pain Goal: 4 (40/97/35 3299)  Complications: No apparent anesthesia complications

## 2018-09-13 ENCOUNTER — Ambulatory Visit (HOSPITAL_COMMUNITY)

## 2018-09-13 ENCOUNTER — Encounter (HOSPITAL_COMMUNITY): Payer: Self-pay | Admitting: Orthopedic Surgery

## 2018-09-13 DIAGNOSIS — J449 Chronic obstructive pulmonary disease, unspecified: Secondary | ICD-10-CM

## 2018-09-13 DIAGNOSIS — R42 Dizziness and giddiness: Secondary | ICD-10-CM | POA: Diagnosis not present

## 2018-09-13 DIAGNOSIS — R079 Chest pain, unspecified: Secondary | ICD-10-CM | POA: Diagnosis not present

## 2018-09-13 DIAGNOSIS — D509 Iron deficiency anemia, unspecified: Secondary | ICD-10-CM | POA: Diagnosis not present

## 2018-09-13 DIAGNOSIS — M1712 Unilateral primary osteoarthritis, left knee: Secondary | ICD-10-CM | POA: Diagnosis not present

## 2018-09-13 DIAGNOSIS — R112 Nausea with vomiting, unspecified: Secondary | ICD-10-CM

## 2018-09-13 DIAGNOSIS — G4733 Obstructive sleep apnea (adult) (pediatric): Secondary | ICD-10-CM | POA: Diagnosis not present

## 2018-09-13 LAB — CBC
HCT: 34.4 % — ABNORMAL LOW (ref 39.0–52.0)
Hemoglobin: 11.2 g/dL — ABNORMAL LOW (ref 13.0–17.0)
MCH: 31.2 pg (ref 26.0–34.0)
MCHC: 32.6 g/dL (ref 30.0–36.0)
MCV: 95.8 fL (ref 80.0–100.0)
NRBC: 0 % (ref 0.0–0.2)
PLATELETS: 280 10*3/uL (ref 150–400)
RBC: 3.59 MIL/uL — AB (ref 4.22–5.81)
RDW: 13.1 % (ref 11.5–15.5)
WBC: 8.2 10*3/uL (ref 4.0–10.5)

## 2018-09-13 LAB — BASIC METABOLIC PANEL
Anion gap: 9 (ref 5–15)
BUN: 9 mg/dL (ref 6–20)
CO2: 25 mmol/L (ref 22–32)
CREATININE: 1.04 mg/dL (ref 0.61–1.24)
Calcium: 8 mg/dL — ABNORMAL LOW (ref 8.9–10.3)
Chloride: 103 mmol/L (ref 98–111)
GFR calc Af Amer: >60 mL/min (ref >60)
GFR calc non Af Amer: >60 mL/min (ref >60)
Glucose, Bld: 147 mg/dL — ABNORMAL HIGH (ref 70–99)
Potassium: 3.8 mmol/L (ref 3.5–5.1)
SODIUM: 137 mmol/L (ref 135–145)

## 2018-09-13 NOTE — Progress Notes (Signed)
Physical Therapy Treatment Patient Details Name: Jacob Hanna MRN: [id number] DOB: [birth year] Today's Date: 09/13/2018    History of Present Illness 57 YO male s/p L TKR on 09/12/18. PMH includes COPD, OSA, preDM, HLD, L medial meniscus tear and multiple arthroscopies 2014-2019, GI bleed, L chondroplasty.     PT Comments    Treatment limited by nausea/dizziness/pain. Assisted pt from bed to recliner and performed TKA exercises. BP sitting 118/70, HR 93, SaO2 97% on room air. Pt unable to ambulate this session.    Follow Up Recommendations  Follow surgeon's recommendation for DC plan and follow-up therapies;Supervision for mobility/OOB     Equipment Recommendations  None recommended by PT    Recommendations for Other Services       Precautions / Restrictions Precautions Precautions: Fall Restrictions Weight Bearing Restrictions: No Other Position/Activity Restrictions: WBAT     Mobility  Bed Mobility Overal bed mobility: Needs Assistance Bed Mobility: Supine to Sit     Supine to sit: HOB elevated;Supervision     General bed mobility comments: no physical assist needed, supervision for safety  Transfers Overall transfer level: Needs assistance Equipment used: Rolling walker (2 wheeled) Transfers: Sit to/from [employer] Sit to Stand: Min assist;From elevated surface Stand pivot transfers: Min assist       General transfer comment: Min assist for steadying upon standing, verbal cuing for hand placement on RW. Pt reported dizziness in standing so deferred ambulation.   Ambulation/Gait         Gait velocity: decr        Stairs             Wheelchair Mobility    Modified Rankin (Stroke Patients Only)       Balance Overall balance assessment: Mild deficits observed, not formally tested                                          Cognition Arousal/Alertness: Awake/alert Behavior During Therapy:  Anxious Overall Cognitive Status: Within Functional Limits for tasks assessed                                        Exercises Total Joint Exercises Ankle Circles/Pumps: AROM;Both;Seated;10 reps;Supine Quad Sets: AROM;Left;5 reps;Supine Short Arc Quad: AAROM;Left;5 reps;Supine Heel Slides: AAROM;Supine;Left Hip ABduction/ADduction: AAROM;Left;10 reps;Supine Straight Leg Raises: AAROM;Left;Supine;5 reps    General Comments        Pertinent Vitals/Pain Pain Score: 7  Pain Location: L knee Pain Descriptors / Indicators: Sore Pain Intervention(s): Limited activity within patient's tolerance;Monitored during session;Premedicated before session    Home Living                      Prior Function            PT Goals (current goals can now be found in the care plan section) Acute Rehab PT Goals Patient Stated Goal: decrease pain  PT Goal Formulation: With patient Time For Goal Achievement: 09/19/18 Potential to Achieve Goals: Good Progress towards PT goals: Not progressing toward goals - comment(dizzy/nauseous)    Frequency    7X/week      PT Plan      Co-evaluation              AM-PAC PT "6 Clicks" Daily Activity  Outcome Measure  Difficulty turning over in bed (including adjusting bedclothes, sheets and blankets)?: A Little Difficulty moving from lying on back to sitting on the side of the bed? : A Little Difficulty sitting down on and standing up from a chair with arms (e.g., wheelchair, bedside commode, etc,.)?: A Little Help needed moving to and from a bed to chair (including a wheelchair)?: A Little Help needed walking in hospital room?: A Lot Help needed climbing 3-5 steps with a railing? : A Lot 6 Click Score: 16    End of Session Equipment Utilized During Treatment: Gait belt Activity Tolerance: Patient limited by pain Patient left: in chair;with call bell/phone within reach;with family/visitor present Nurse  Communication: Mobility status;Other (comment)(pt nauseous/dizzy) PT Visit Diagnosis: Other abnormalities of gait and mobility (R26.89);Pain Pain - Right/Left: Left Pain - part of body: Knee     Time: 4098-1191 PT Time Calculation (min) (ACUTE ONLY): 30 min  Charges:  $Gait Training: 8-22 mins $Therapeutic Exercise: 8-22 mins                     Ralene Bathe Kistler PT 09/13/2018  Acute Rehabilitation Services Pager [phone] Office [phone]

## 2018-09-13 NOTE — Progress Notes (Signed)
Physical Therapy Treatment Patient Details Name: Jacob Hanna MRN: [id number] DOB: September 16, 1961 Today's Date: 09/13/2018    History of Present Illness 57 YO male s/p L TKR on 09/12/18. PMH includes COPD, OSA, preDM, HLD, L medial meniscus tear and multiple arthroscopies 2014-2019, GI bleed, L chondroplasty.     PT Comments    Pt had improved tolerance of activity this afternoon, he ambulated 25' with RW, no loss of balance. Nausea/dizziness are improving since he stopped taking narcotic pain medication.   Follow Up Recommendations  Follow surgeon's recommendation for DC plan and follow-up therapies;Supervision for mobility/OOB     Equipment Recommendations  None recommended by PT    Recommendations for Other Services       Precautions / Restrictions Precautions Precautions: Fall Restrictions Weight Bearing Restrictions: No Other Position/Activity Restrictions: WBAT     Mobility  Bed Mobility               General bed mobility comments: up in recliner  Transfers Overall transfer level: Needs assistance Equipment used: Rolling walker (2 wheeled) Transfers: Sit to/from Stand Sit to Stand: Min guard         General transfer comment: VCs hand placement  Ambulation/Gait Ambulation/Gait assistance: Min guard Gait Distance (Feet): 90 Feet Assistive device: Rolling walker (2 wheeled) Gait Pattern/deviations: Step-to pattern;Decreased stride length;Antalgic Gait velocity: decr    General Gait Details: no loss of balance, distance limited by pain/fatigue   Stairs             Wheelchair Mobility    Modified Rankin (Stroke Patients Only)       Balance Overall balance assessment: Mild deficits observed, not formally tested                                          Cognition Arousal/Alertness: Awake/alert Behavior During Therapy: Anxious Overall Cognitive Status: Within Functional Limits for tasks assessed                                        Exercises Total Joint Exercises Ankle Circles/Pumps: AROM;Both;Seated;10 reps;Supine Quad Sets: AROM;Left;5 reps;Supine Short Arc Quad: AAROM;Left;5 reps;Supine Heel Slides: AAROM;Supine;Left;10 reps Hip ABduction/ADduction: AAROM;Left;10 reps;Supine Straight Leg Raises: AAROM;Left;Supine;5 reps    General Comments        Pertinent Vitals/Pain Pain Score: 5  Pain Location: L knee Pain Descriptors / Indicators: Sore Pain Intervention(s): Limited activity within patient's tolerance;Monitored during session;Ice applied;Patient requesting pain meds-RN notified    Home Living                      Prior Function            PT Goals (current goals can now be found in the care plan section) Acute Rehab PT Goals Patient Stated Goal: decrease pain  PT Goal Formulation: With patient Time For Goal Achievement: 09/19/18 Potential to Achieve Goals: Good Progress towards PT goals: Progressing toward goals    Frequency    7X/week      PT Plan Current plan remains appropriate    Co-evaluation              AM-PAC PT "6 Clicks" Daily Activity  Outcome Measure  Difficulty turning over in bed (including adjusting bedclothes, sheets and blankets)?: A Little Difficulty moving from  lying on back to sitting on the side of the bed? : A Little Difficulty sitting down on and standing up from a chair with arms (e.g., wheelchair, bedside commode, etc,.)?: A Little Help needed moving to and from a bed to chair (including a wheelchair)?: A Little Help needed walking in hospital room?: A Little Help needed climbing 3-5 steps with a railing? : A Lot 6 Click Score: 17    End of Session Equipment Utilized During Treatment: Gait belt Activity Tolerance: Patient limited by pain;Patient tolerated treatment well Patient left: in chair;with call bell/phone within reach;with family/visitor present Nurse Communication: Mobility status;Other  (comment)(pt nauseous/dizzy) PT Visit Diagnosis: Other abnormalities of gait and mobility (R26.89);Pain Pain - Right/Left: Left Pain - part of body: Knee     Time: 1610-9604 PT Time Calculation (min) (ACUTE ONLY): 19 min  Charges:  $Gait Training: 8-22 mins $Therapeutic Exercise: 8-22 mins                     Ralene Bathe Kistler PT 09/13/2018  Acute Rehabilitation Services Pager [phone] Office [phone]

## 2018-09-13 NOTE — Plan of Care (Signed)
Pt stable though remains anxious overall with activity and pain. No s/s of distress. Rn medicating for pain and other needs. No changes needed to care plans.

## 2018-09-13 NOTE — Care Management Note (Signed)
Case Management Note  Patient Details  Name: ZAMIER EGGEBRECHT MRN: [id number] Date of Birth: [birth year]  Subjective/Objective:  Made several attempts to contact Pamala Hurry [facility] [phone]. With 3rd attempt, got in touch with someone who was able to transfer me to [facility]. She requested orders be faxed to her at [phone].                 Action/Plan: Orders faxed pre request. Awaiting f/u  Expected Discharge Date:  09/13/18               Expected Discharge Plan:  Home w Home Health Services  In-House Referral:  NA  Discharge planning Services  CM Consult  Post Acute Care Choice:  Home Health Choice offered to:  Patient, Spouse  DME Arranged:  N/A DME Agency:  NA  HH Arranged:  PT HH Agency:  Other - See comment(per WC )  Status of Service:  In process, will continue to follow  If discussed at Long Length of Stay Meetings, dates discussed:    Additional Comments:  Alexis Goodell, RN 09/13/2018, 1:47 PM

## 2018-09-13 NOTE — Progress Notes (Signed)
Chest X ray was done with pending results. EKG was done and the unconfirmed result shows normal sinus rhythm and normal EKG. Patient stated that he is feeling much better. No complaints of nausea or vomiting voiced. Continue to monitor.

## 2018-09-13 NOTE — Plan of Care (Signed)
  Problem: Health Behavior/Discharge Planning: Goal: Ability to manage health-related needs will improve Outcome: Progressing   Problem: Clinical Measurements: Goal: Ability to maintain clinical measurements within normal limits will improve Outcome: Progressing Goal: Will remain free from infection Outcome: Progressing Goal: Diagnostic test results will improve Outcome: Progressing Goal: Respiratory complications will improve Outcome: Progressing Goal: Cardiovascular complication will be avoided Outcome: Progressing   Problem: Nutrition: Goal: Adequate nutrition will be maintained Outcome: Not Met (add Reason) Note:  Patient complaining of nausea, had one episode of vomiting small amount of clear mucus. Did not consume lunch. Also stated feeling dizzy while sitting up.    Problem: Elimination: Goal: Will not experience complications related to bowel motility Outcome: Progressing Goal: Will not experience complications related to urinary retention Outcome: Completed/Met   Problem: Pain Managment: Goal: General experience of comfort will improve Outcome: Progressing   Problem: Safety: Goal: Ability to remain free from injury will improve Outcome: Progressing   Problem: Education: Goal: Knowledge of the prescribed therapeutic regimen will improve Outcome: Progressing Goal: Individualized Educational Video(s) Outcome: Adequate for Discharge   Problem: Activity: Goal: Ability to avoid complications of mobility impairment will improve Outcome: Progressing Goal: Range of joint motion will improve Outcome: Progressing   Problem: Clinical Measurements: Goal: Postoperative complications will be avoided or minimized Outcome: Progressing   Problem: Pain Management: Goal: Pain level will decrease with appropriate interventions Outcome: Progressing   Problem: Skin Integrity: Goal: Will show signs of wound healing Outcome: Progressing

## 2018-09-13 NOTE — Consult Note (Signed)
Medical Consultation   Jacob Hanna  RUE:[id number]  DOB: 1961-04-02  DOA: 09/12/2018  PCP: Babs Sciara, MD   Requesting physician: Gean Birchwood, MD  Reason for consultation: Dizziness, shortness of breath, nausea, vomiting   History of Present Illness: Jacob Hanna is an 57 y.o. male with history of COPD, hyperlipidemia, OSA, osteoarthritis.  Patient is currently in the hospital secondary to left knee degenerative disease and is status post total knee arthroplasty performed on 09/12/18.  Yesterday evening, patient states that he developed some nausea, vomiting, dizziness with ambulation in addition to some shortness of breath.  He felt like this was secondary to the narcotic.  This afternoon, symptoms mostly resolved with some residual dizziness upon standing and ambulation.  He also had some mild substernal chest pain without radiation.  Cessation of oxycodone appears to have improved his symptoms.   Review of Systems:  Review of Systems  Constitutional: Negative for chills and fever.  Respiratory: Positive for shortness of breath. Negative for cough, sputum production and wheezing.   Cardiovascular: Positive for chest pain. Negative for palpitations.  Gastrointestinal: Positive for diarrhea, nausea and vomiting. Negative for abdominal pain and constipation.  Musculoskeletal: Positive for joint pain. Negative for falls.  All other systems reviewed and are negative.    Past Medical History: Past Medical History:  Diagnosis Date  . Allergy to environmental factors   . COPD (chronic obstructive pulmonary disease) (HCC)   . Exercise-induced asthma   . IDA (iron deficiency anemia)   . Mild obstructive sleep apnea    per study 03-27-2013 (in epic)  no cpap recommended  . OA (osteoarthritis)    left knee    Past Surgical History: Past Surgical History:  Procedure Laterality Date  . CARDIOVASCULAR STRESS TEST  07/26/2017   Low risk nuclear study w/  no ischemia/  normal LV function and wall motion, nuclear stress ef 60%  . CHONDROPLASTY Left 04/14/2013   Procedure: CHONDROPLASTY;  Surgeon: Vickki Hearing, MD;  Location: [facility];  Service: Orthopedics;  Laterality: Left;  . COLONOSCOPY    . ESOPHAGOGASTRODUODENOSCOPY    . GIVENS CAPSULE STUDY  09/07/2012   Procedure: GIVENS CAPSULE STUDY;  Surgeon: Malissa Hippo, MD;  Location: [facility];  Service: Endoscopy;  Laterality: N/A;  730  . GIVENS CAPSULE STUDY N/A 06/23/2016   Procedure: GIVENS CAPSULE STUDY;  Surgeon: Malissa Hippo, MD;  Location: [facility];  Service: Endoscopy;  Laterality: N/A;  730  . KNEE ARTHROSCOPY Left 06/ 2019  @[facility]   . KNEE ARTHROSCOPY WITH EXCISION PLICA Left 01/31/2013   Procedure: KNEE ARTHROSCOPY WITH EXCISION PLICA;  Surgeon: Vickki Hearing, MD;  Location: [facility];  Service: Orthopedics;  Laterality: Left;  . KNEE ARTHROSCOPY WITH MEDIAL MENISECTOMY Left 01/31/2013   Procedure: KNEE ARTHROSCOPY WITH PARTIAL  MEDIAL MENISECTOMY;  Surgeon: Vickki Hearing, MD;  Location: [facility];  Service: Orthopedics;  Laterality: Left;  . KNEE ARTHROSCOPY WITH MEDIAL MENISECTOMY Left 04/14/2013   Procedure: KNEE ARTHROSCOPY WITH PARTIAL MEDIAL MENISECTOMY;  Surgeon: Vickki Hearing, MD;  Location: [facility];  Service: Orthopedics;  Laterality: Left;  Partial medial menisectomy  . TOTAL KNEE ARTHROPLASTY Left 09/12/2018   Procedure: LEFT TOTAL KNEE ARTHROPLASTY;  Surgeon: Gean Birchwood, MD;  Location: [facility];  Service: Orthopedics;  Laterality: Left;  Adductor Block     Allergies:  No Known Allergies   Social History:  reports that he has never smoked. He has never used smokeless tobacco. He reports that he does not drink alcohol or use drugs.   Family History: Family History  Problem Relation Age of Onset  . Heart attack Father        Pacemaker  . Diabetes Sister   . Diabetes Sister     Physical Exam: Vitals:   09/13/18 1735 09/13/18 1736 09/13/18 1737  09/13/18 1804  BP: 118/70 115/69 126/81 121/66  Pulse: 88 89 (!) 104 88  Resp:    16  Temp:    98.7 F (37.1 C)  TempSrc:    Oral  SpO2:    97%  Weight:      Height:        Physical Exam  Constitutional: He is oriented to person, place, and time. He appears well-developed and well-nourished. No distress.  HENT:  Mouth/Throat: Oropharynx is clear and moist.  Eyes: Pupils are equal, round, and reactive to light. Conjunctivae and EOM are normal.  Neck: Normal range of motion.  Cardiovascular: Normal rate and regular rhythm.  Murmur heard.  Systolic murmur is present with a grade of 1/6. Pulmonary/Chest: Effort normal. No respiratory distress. He has decreased breath sounds in the left lower field. He has no wheezes. He has no rales.  Abdominal: Soft. Bowel sounds are normal. He exhibits no distension. There is no tenderness. There is no rebound and no guarding.  Musculoskeletal: Normal range of motion. He exhibits no edema or tenderness.  Lymphadenopathy:    He has no cervical adenopathy.  Neurological: He is alert and oriented to person, place, and time.  Skin: Skin is warm and dry. He is not diaphoretic.  Psychiatric: He has a normal mood and affect.     Data reviewed:  I have personally reviewed following labs and imaging studies Labs:  CBC: Recent Labs  Lab 09/12/18 1230 09/13/18 0408  WBC 4.5 8.2  NEUTROABS 2.0  --   HGB 12.8* 11.2*  HCT 37.5* 34.4*  MCV 92.8 95.8  PLT 337 280    Basic Metabolic Panel: Recent Labs  Lab 09/13/18 0408  NA 137  K 3.8  CL 103  CO2 25  GLUCOSE 147*  BUN 9  CREATININE 1.04  CALCIUM 8.0*   GFR Estimated Creatinine Clearance: 97.6 mL/min (by C-G formula based on SCr of 1.04 mg/dL). Liver Function Tests: No results for input(s): AST, ALT, ALKPHOS, BILITOT, PROT, ALBUMIN in the last 168 hours. No results for input(s): LIPASE, AMYLASE in the last 168 hours. No results for input(s): AMMONIA in the last 168 hours. Coagulation  profile Recent Labs  Lab 09/12/18 1230  INR 0.91    Cardiac Enzymes: No results for input(s): CKTOTAL, CKMB, CKMBINDEX, TROPONINI in the last 168 hours. BNP: Invalid input(s): POCBNP CBG: No results for input(s): GLUCAP in the last 168 hours. D-Dimer No results for input(s): DDIMER in the last 72 hours. Hgb A1c No results for input(s): HGBA1C in the last 72 hours. Lipid Profile No results for input(s): CHOL, HDL, LDLCALC, TRIG, CHOLHDL, LDLDIRECT in the last 72 hours. Thyroid function studies No results for input(s): TSH, T4TOTAL, T3FREE, THYROIDAB in the last 72 hours.  Invalid input(s): FREET3 Anemia work up No results for input(s): VITAMINB12, FOLATE, FERRITIN, TIBC, IRON, RETICCTPCT in the last 72 hours. Urinalysis    Component Value Date/Time   COLORURINE YELLOW 09/12/2018 1210   APPEARANCEUR CLEAR 09/12/2018 1210   LABSPEC 1.013 09/12/2018 1210   PHURINE 6.0 09/12/2018 1210  GLUCOSEU NEGATIVE 09/12/2018 1210   HGBUR NEGATIVE 09/12/2018 1210   BILIRUBINUR NEGATIVE 09/12/2018 1210   KETONESUR NEGATIVE 09/12/2018 1210   PROTEINUR NEGATIVE 09/12/2018 1210   NITRITE NEGATIVE 09/12/2018 1210   LEUKOCYTESUR TRACE (A) 09/12/2018 1210     Microbiology Recent Results (from the past 240 hour(s))  Surgical pcr screen     Status: None   Collection Time: 09/05/18 12:10 PM  Result Value Ref Range Status   MRSA, PCR NEGATIVE NEGATIVE Final   Staphylococcus aureus NEGATIVE NEGATIVE Final    Comment: (NOTE) The Xpert SA Assay (FDA approved for NASAL specimens in patients 51 years of age and older), is one component of a comprehensive surveillance program. It is not intended to diagnose infection nor to guide or monitor treatment. Performed at [facility], [address], [place], [place] [address]        Inpatient Medications:   Scheduled Meds: . aspirin  81 mg Oral BID  . docusate sodium  100 mg Oral BID  . ferrous sulfate  325 mg Oral BID  WC  . gabapentin  300 mg Oral TID  . pantoprazole  40 mg Oral Daily   Continuous Infusions: . dextrose 5 % and 0.45 % NaCl with KCl 20 mEq/L 125 mL/hr at 09/13/18 1113  . methocarbamol (ROBAXIN) IV       Radiological Exams on Admission: No results found.  Impression/Recommendations Principal Problem:   Degenerative arthritis of left knee Active Problems:   S/P total knee arthroplasty, left  Nausea/vomiting Transient. Resolved now. Possibly secondary to narcotics. Patient with associated substernal chest discomfort. -Zofran prn  Dizziness/shortness of breath Present when ambulating. Improved. Thought to be associated with narcotic use as well. BP has been normotensive. Hemoglobin stable. Was given some oxygen, however, no hypoxia on room air. -Chest x-ray -EKG -Orthostatic vitals  Degenerative arthritis Patient is s/p total knee arthroplasty  COPD No wheezing. Appears baseline.   Thank you for this consultation.  Our [facility] hospitalist team will follow the patient with you.    Jacquelin Hawking M.D. [facility] 09/13/2018, 6:05 PM

## 2018-09-13 NOTE — Progress Notes (Signed)
PATIENT ID: Jacob Hanna  MRN: [id number]  DOB/AGE:  56-Feb-1962 / 56 y.o.  1 Day Post-Op Procedure(s) (LRB): LEFT TOTAL KNEE ARTHROPLASTY (Left)    PROGRESS NOTE Subjective: Patient is alert, oriented, 1 Nausea, 0 Vomiting, yes passing gas. Taking PO Well. Denies SOB, Chest or Calf Pain. Using Incentive Spirometer, PAS in place. Ambulate 25 feet, Patient reports pain as 5/10 .    Objective: Vital signs in last 24 hours: Vitals:   09/12/18 1948 09/12/18 2146 09/13/18 0232 09/13/18 0548  BP: 125/79 136/81 123/81 114/76  Pulse: 96 87 88 87  Resp:      Temp:  98.5 F (36.9 C) 98.4 F (36.9 C) 98.6 F (37 C)  TempSrc:  Oral Oral Oral  SpO2: 96% 98% 99% 98%  Weight:      Height:          Intake/Output from previous day: I/O last 3 completed shifts: In: 2668.8 [P.O.:240; I.V.:2128.8; IV Piggyback:300] Out: 1850 [Urine:1750; Blood:100]   Intake/Output this shift: Total I/O In: 2244.4 [P.O.:840; I.V.:1404.4] Out: 1800 [Urine:1800]   LABORATORY DATA: Recent Labs    09/12/18 1230 09/13/18 0408  WBC 4.5 8.2  HGB 12.8* 11.2*  HCT 37.5* 34.4*  PLT 337 280  NA  --  137  K  --  3.8  CL  --  103  CO2  --  25  BUN  --  9  CREATININE  --  1.04  GLUCOSE  --  147*  INR 0.91  --   CALCIUM  --  8.0*    Examination: Neurologically intact ABD soft Neurovascular intact Sensation intact distally Intact pulses distally Dorsiflexion/Plantar flexion intact Incision: dressing C/D/I No cellulitis present Compartment soft patient can perform straight leg raise}  Assessment:   1 Day Post-Op Procedure(s) (LRB): LEFT TOTAL KNEE ARTHROPLASTY (Left) ADDITIONAL DIAGNOSIS: Expected Acute Blood Loss Anemia, Sleep Apnea, COPD  Patient's anticipated LOS is less than 2 midnights, meeting these requirements: - Younger than 56 - Lives within 1 hour of care - Has a competent adult at home to recover with post-op recover - NO history of  - Chronic pain requiring opiods  -  Diabetes  - Coronary Artery Disease  - Heart failure  - Heart attack  - Stroke  - DVT/VTE  - Cardiac arrhythmia  - Respiratory Failure/COPD  - Renal failure  - Anemia  - Advanced Liver disease       Plan: PT/OT WBAT, AROM and PROM  DVT Prophylaxis:  SCDx72hrs, ASA 81 mg BID x 2 weeks DISCHARGE PLAN: HomeComment one passes physical therapy goals expect today. DISCHARGE NEEDS: HHPT, Walker and 3-in-1 comode seat     Nestor Lewandowsky 09/13/2018, 6:56 AM

## 2018-09-13 NOTE — Progress Notes (Signed)
Patient complaint of shortness of breath, sternal pain, getting dizzy while siting up and nausea. MD was paged and awaiting further instructions. Will continue to monitor.

## 2018-09-13 NOTE — Plan of Care (Signed)
Plan of Care Continues

## 2018-09-14 ENCOUNTER — Encounter (HOSPITAL_COMMUNITY): Payer: Self-pay | Admitting: Radiology

## 2018-09-14 ENCOUNTER — Ambulatory Visit (HOSPITAL_COMMUNITY)

## 2018-09-14 DIAGNOSIS — R079 Chest pain, unspecified: Secondary | ICD-10-CM | POA: Diagnosis not present

## 2018-09-14 DIAGNOSIS — M1712 Unilateral primary osteoarthritis, left knee: Secondary | ICD-10-CM | POA: Diagnosis not present

## 2018-09-14 LAB — CBC
HCT: 29.6 % — ABNORMAL LOW (ref 39.0–52.0)
HEMOGLOBIN: 9.7 g/dL — AB (ref 13.0–17.0)
MCH: 31.3 pg (ref 26.0–34.0)
MCHC: 32.8 g/dL (ref 30.0–36.0)
MCV: 95.5 fL (ref 80.0–100.0)
NRBC: 0 % (ref 0.0–0.2)
Platelets: 282 10*3/uL (ref 150–400)
RBC: 3.1 MIL/uL — AB (ref 4.22–5.81)
RDW: 13.2 % (ref 11.5–15.5)
WBC: 11.7 10*3/uL — AB (ref 4.0–10.5)

## 2018-09-14 LAB — TROPONIN I: Troponin I: <0.03 ng/mL (ref <0.03)

## 2018-09-14 LAB — D-DIMER, QUANTITATIVE: D-Dimer, Quant: 1.28 ug/mL-FEU — ABNORMAL HIGH (ref 0.00–0.50)

## 2018-09-14 MED ORDER — IOPAMIDOL (ISOVUE-370) INJECTION 76%
100.0000 mL | Freq: Once | INTRAVENOUS | Status: AC | PRN
  Administered 2018-09-14: 100 mL via INTRAVENOUS

## 2018-09-14 MED ORDER — CELECOXIB 200 MG PO CAPS: 200.0000 mg | ORAL_CAPSULE | Freq: Two times a day (BID) | ORAL | 2 refills | Status: AC

## 2018-09-14 MED ORDER — SODIUM CHLORIDE 0.9 % IV SOLN: INTRAVENOUS | Status: DC

## 2018-09-14 MED ORDER — GABAPENTIN 300 MG PO CAPS: 300.0000 mg | ORAL_CAPSULE | Freq: Three times a day (TID) | ORAL | 2 refills | Status: DC

## 2018-09-14 MED ORDER — IOPAMIDOL (ISOVUE-370) INJECTION 76%
INTRAVENOUS | Status: AC
  Filled 2018-09-14: qty 100

## 2018-09-14 MED ORDER — SODIUM CHLORIDE (PF) 0.9 % IJ SOLN
INTRAMUSCULAR | Status: AC
  Filled 2018-09-14: qty 50

## 2018-09-14 MED ORDER — HYDROCODONE-ACETAMINOPHEN 5-325 MG PO TABS: 1.0000 | ORAL_TABLET | Freq: Four times a day (QID) | ORAL | 0 refills | Status: DC | PRN

## 2018-09-14 NOTE — Discharge Summary (Signed)
Patient ID: Jacob Hanna MRN: [id number] DOB/AGE: 1961/01/19 57 y.o.  Admit date: 09/12/2018 Discharge date: 09/14/2018  Admission Diagnoses:  Principal Problem:   Degenerative arthritis of left knee Active Problems:   S/P total knee arthroplasty, left   Discharge Diagnoses:  Same  Past Medical History:  Diagnosis Date  . Allergy to environmental factors   . COPD (chronic obstructive pulmonary disease) (HCC)   . Exercise-induced asthma   . IDA (iron deficiency anemia)   . Mild obstructive sleep apnea    per study 03-27-2013 (in epic)  no cpap recommended  . OA (osteoarthritis)    left knee    Surgeries: Procedure(s): LEFT TOTAL KNEE ARTHROPLASTY on 09/12/2018   Consultants:   Discharged Condition: Improved  Hospital Course: Jacob Hanna is an 57 y.o. male who was admitted 09/12/2018 for operative treatment ofDegenerative arthritis of left knee. Patient has severe unremitting pain that affects sleep, daily activities, and work/hobbies. After pre-op clearance the patient was taken to the operating room on 09/12/2018 and underwent  Procedure(s): LEFT TOTAL KNEE ARTHROPLASTY.    Patient was given perioperative antibiotics:  Anti-infectives (From admission, onward)   Start     Dose/Rate Route Frequency Ordered Stop   09/12/18 1215  ceFAZolin (ANCEF) IVPB 2g/100 mL premix     2 g 200 mL/hr over 30 Minutes Intravenous On call to O.R. 09/12/18 1209 09/12/18 1414       Patient was given sequential compression devices, early ambulation, and chemoprophylaxis to prevent DVT.  Patient benefited maximally from hospital stay and there were no complications.    Recent vital signs:  Patient Vitals for the past 24 hrs:  BP Temp Temp src Pulse Resp SpO2  09/14/18 0615 123/69 98.1 F (36.7 C) Oral 66 18 99 %  09/14/18 0217 114/71 98.4 F (36.9 C) Oral 71 14 97 %  09/13/18 1804 121/66 98.7 F (37.1 C) Oral 88 16 97 %  09/13/18 1737 126/81 - - (!) 104 - -  09/13/18 1736  115/69 - - 89 - -  09/13/18 1735 118/70 - - 88 - -  09/13/18 1401 115/60 - - 80 16 97 %     Recent laboratory studies:  Recent Labs    09/12/18 1230 09/13/18 0408 09/14/18 0524  WBC 4.5 8.2 11.7*  HGB 12.8* 11.2* 9.7*  HCT 37.5* 34.4* 29.6*  PLT 337 280 282  NA  --  137  --   K  --  3.8  --   CL  --  103  --   CO2  --  25  --   BUN  --  9  --   CREATININE  --  1.04  --   GLUCOSE  --  147*  --   INR 0.91  --   --   CALCIUM  --  8.0*  --      Discharge Medications:     Diagnostic Studies: Dg Chest Port 1 View  Result Date: 09/13/2018 CLINICAL DATA:  Shortness of breath EXAM: PORTABLE CHEST 1 VIEW COMPARISON:  None. FINDINGS: No focal opacity. Linear scarring or atelectasis in the left lower lung. Borderline cardiomegaly. No pneumothorax. Retrocardiac opacity and lucency, suspect moderate to large hiatal hernia IMPRESSION: 1. Linear scarring or atelectasis at the left base 2. Borderline cardiomegaly 3. Retrocardiac opacity and lucency suspected to represent moderate to large hiatal hernia Electronically Signed   By: Jasmine Pang M.D.   On: 09/13/2018 19:04    Disposition: Discharge disposition: 01-Home or  Self Care       Discharge Instructions    Call MD / Call 911   Complete by:  As directed    If you experience chest pain or shortness of breath, CALL 911 and be transported to the hospital emergency room.  If you develope a fever above 101 F, pus (white drainage) or increased drainage or redness at the wound, or calf pain, call your surgeon's office.   Call MD / Call 911   Complete by:  As directed    If you experience chest pain or shortness of breath, CALL 911 and be transported to the hospital emergency room.  If you develope a fever above 101 F, pus (white drainage) or increased drainage or redness at the wound, or calf pain, call your surgeon's office.   Change dressing   Complete by:  As directed    Change dressing if more than 40% drainage on the window of the  dressing   Constipation Prevention   Complete by:  As directed    Drink plenty of fluids.  Prune juice may be helpful.  You may use a stool softener, such as Colace (over the counter) 100 mg twice a day.  Use MiraLax (over the counter) for constipation as needed.   Constipation Prevention   Complete by:  As directed    Drink plenty of fluids.  Prune juice may be helpful.  You may use a stool softener, such as Colace (over the counter) 100 mg twice a day.  Use MiraLax (over the counter) for constipation as needed.   Diet - low sodium heart healthy   Complete by:  As directed    Diet - low sodium heart healthy   Complete by:  As directed    Driving restrictions   Complete by:  As directed    No driving for 2 weeks   Increase activity slowly as tolerated   Complete by:  As directed    Increase activity slowly as tolerated   Complete by:  As directed    Patient may shower   Complete by:  As directed    You may shower without a dressing once there is no drainage.  Do not wash over the wound.  If drainage remains, cover wound with plastic wrap and then shower.   Weight bearing as tolerated   Complete by:  As directed       Follow-up Information    Gean Birchwood, MD In 2 weeks.   Specialty:  Orthopedic Surgery Contact information: [address] [place] [address] [phone]            Signed: Dannielle Burn 09/14/2018, 12:54 PM

## 2018-09-14 NOTE — Progress Notes (Signed)
Spoke with WC rep, HH is being arranged and they will f/u with patient at d/c.

## 2018-09-14 NOTE — Progress Notes (Signed)
Physical Therapy Treatment Patient Details Name: Jacob Hanna MRN: [id number] DOB: 04-23-1961 Today's Date: 09/14/2018    History of Present Illness 57 YO male s/p L TKR on 09/12/18. PMH includes COPD, OSA, preDM, HLD, L medial meniscus tear and multiple arthroscopies 2014-2019, GI bleed, L chondroplasty.     PT Comments    Pt reports he's feeling much better today, denies nausea/dizziness, but does feel "lightheaded from the oxycodone". He ambulated 160' with RW, no loss of balance. Hospitalist came to see pt so I will return later this morning  to review HEP.   Follow Up Recommendations  Follow surgeon's recommendation for DC plan and follow-up therapies;Supervision for mobility/OOB     Equipment Recommendations  None recommended by PT    Recommendations for Other Services       Precautions / Restrictions Precautions Precautions: Fall Restrictions Weight Bearing Restrictions: No Other Position/Activity Restrictions: WBAT     Mobility  Bed Mobility Overal bed mobility: Needs Assistance Bed Mobility: Supine to Sit     Supine to sit: HOB elevated;Supervision     General bed mobility comments: no physical assist needed, supervision for safety, pt lifted LLE out of bed with a towel  Transfers Overall transfer level: Needs assistance Equipment used: Rolling walker (2 wheeled) Transfers: Sit to/from Stand Sit to Stand: From elevated surface;Min guard Stand pivot transfers: Min assist       General transfer comment:  verbal cuing for hand placement on RW. pt denied dizziness in standing, but did feel "lightheaded from the oxycodone"  Ambulation/Gait Ambulation/Gait assistance: Supervision Gait Distance (Feet): 160 Feet Assistive device: Rolling walker (2 wheeled) Gait Pattern/deviations: Step-to pattern Gait velocity: decr    General Gait Details: no loss of balance, good sequencing   Stairs Stairs: (pt has a ramp at home, declined stair training)           Wheelchair Mobility    Modified Rankin (Stroke Patients Only)       Balance Overall balance assessment: Mild deficits observed, not formally tested                                          Cognition Arousal/Alertness: Awake/alert Behavior During Therapy: WFL for tasks assessed/performed Overall Cognitive Status: Within Functional Limits for tasks assessed                                        Exercises Total Joint Exercises Knee Flexion: AAROM;Left;10 reps;Seated;AROM Goniometric ROM: 10-50* AAROM L knee    General Comments        Pertinent Vitals/Pain Pain Score: 3  Pain Location: L knee Pain Descriptors / Indicators: Sore Pain Intervention(s): Limited activity within patient's tolerance;Monitored during session;Premedicated before session;Ice applied    Home Living                      Prior Function            PT Goals (current goals can now be found in the care plan section) Acute Rehab PT Goals Patient Stated Goal: decrease pain  PT Goal Formulation: With patient Time For Goal Achievement: 09/19/18 Potential to Achieve Goals: Good Progress towards PT goals: Progressing toward goals    Frequency    7X/week      PT Plan Current  plan remains appropriate    Co-evaluation              AM-PAC PT "6 Clicks" Daily Activity  Outcome Measure  Difficulty turning over in bed (including adjusting bedclothes, sheets and blankets)?: A Little Difficulty moving from lying on back to sitting on the side of the bed? : A Little Difficulty sitting down on and standing up from a chair with arms (e.g., wheelchair, bedside commode, etc,.)?: A Little Help needed moving to and from a bed to chair (including a wheelchair)?: A Little Help needed walking in hospital room?: A Little Help needed climbing 3-5 steps with a railing? : A Little 6 Click Score: 18    End of Session Equipment Utilized During Treatment: Gait  belt Activity Tolerance: Patient tolerated treatment well Patient left: in chair;with call bell/phone within reach;with family/visitor present Nurse Communication: Mobility status PT Visit Diagnosis: Other abnormalities of gait and mobility (R26.89);Pain Pain - Right/Left: Left Pain - part of body: Knee     Time: 1610-9604 PT Time Calculation (min) (ACUTE ONLY): 12 min  Charges:  $Gait Training: 8-22 mins                     Ralene Bathe Kistler PT 09/14/2018  Acute Rehabilitation Services Pager [phone] Office [phone]

## 2018-09-14 NOTE — Progress Notes (Signed)
Physical Therapy Treatment Patient Details Name: Jacob Hanna MRN: [id number] DOB: 1961-05-15 Today's Date: 09/14/2018    History of Present Illness 57 YO male s/p L TKR on 09/12/18. PMH includes COPD, OSA, preDM, HLD, L medial meniscus tear and multiple arthroscopies 2014-2019, GI bleed, L chondroplasty.     PT Comments    Reviewed HEP with pt, he demonstrates good understanding. From PT standpoint, he is ready to DC home.   Follow Up Recommendations  Follow surgeon's recommendation for DC plan and follow-up therapies;Supervision for mobility/OOB     Equipment Recommendations  None recommended by PT    Recommendations for Other Services       Precautions / Restrictions Precautions Precautions: Fall Restrictions Weight Bearing Restrictions: No Other Position/Activity Restrictions: WBAT        Balance Overall balance assessment: Mild deficits observed, not formally tested                                          Cognition Arousal/Alertness: Awake/alert Behavior During Therapy: WFL for tasks assessed/performed Overall Cognitive Status: Within Functional Limits for tasks assessed                                        Exercises Total Joint Exercises Ankle Circles/Pumps: AROM;Both;Seated;10 reps;Supine Quad Sets: AROM;Left;Supine;10 reps Short Arc QuadBarbaraann Hanna;Left;Supine;10 reps Heel Slides: AAROM;Supine;Left;10 reps Hip ABduction/ADduction: AAROM;Left;10 reps;Supine Straight Leg Raises: AAROM;Left;Supine;5 reps Knee Flexion: AAROM;Left;10 reps;Seated;AROM Goniometric ROM: 10-50* AAROM L knee    General Comments        Pertinent Vitals/Pain Pain Score: 3  Pain Location: L knee Pain Descriptors / Indicators: Sore Pain Intervention(s): Limited activity within patient's tolerance;Monitored during session;Premedicated before session;Ice applied    Home Living                      Prior Function            PT  Goals (current goals can now be found in the care plan section) Acute Rehab PT Goals Patient Stated Goal: decrease pain  PT Goal Formulation: With patient Time For Goal Achievement: 09/19/18 Potential to Achieve Goals: Good Progress towards PT goals: Progressing toward goals    Frequency    7X/week      PT Plan Current plan remains appropriate    Co-evaluation              AM-PAC PT "6 Clicks" Daily Activity  Outcome Measure  Difficulty turning over in bed (including adjusting bedclothes, sheets and blankets)?: A Little Difficulty moving from lying on back to sitting on the side of the bed? : A Little Difficulty sitting down on and standing up from a chair with arms (e.g., wheelchair, bedside commode, etc,.)?: A Little Help needed moving to and from a bed to chair (including a wheelchair)?: A Little Help needed walking in hospital room?: A Little Help needed climbing 3-5 steps with a railing? : A Little 6 Click Score: 18    End of Session Equipment Utilized During Treatment: Gait belt Activity Tolerance: Patient tolerated treatment well Patient left: in chair;with call bell/phone within reach;with family/visitor present Nurse Communication: Mobility status PT Visit Diagnosis: Other abnormalities of gait and mobility (R26.89);Pain Pain - Right/Left: Left Pain - part of body: Knee  Time: 1914-7829 PT Time Calculation (min) (ACUTE ONLY): 8 min  Charges:   $Therapeutic Exercise: 8-22 mins                     Jacob Hanna PT 09/14/2018  Acute Rehabilitation Services Pager [phone] Office [phone]

## 2018-09-14 NOTE — Progress Notes (Signed)
PROGRESS NOTE    Jacob Hanna  ZOX:[id number] DOB: Nov 19, 1960 DOA: 09/12/2018 PCP: Babs Sciara, MD  Brief Narrative: History of Present Illness: Jacob Hanna is an 57 y.o. male with history of COPD, hyperlipidemia, OSA, osteoarthritis.  Patient is currently in the hospital secondary to left knee degenerative disease and is status post total knee arthroplasty performed on 09/12/18.  Yesterday evening, patient states that he developed some nausea, vomiting, dizziness with ambulation in addition to some shortness of breath.  He felt like this was secondary to the narcotic.  This afternoon, symptoms mostly resolved with some residual dizziness upon standing and ambulation.  He also had some mild substernal chest pain without radiation.  Cessation of oxycodone appears to have improved his symptoms.   Assessment & Plan:   Principal Problem:   Degenerative arthritis of left knee Active Problems:   S/P total knee arthroplasty, left  1-Chest pain, dizziness; SOB.  EKG no st changes.  Troponin negative.  He is at risk of PE, due to recent sx, D dimer was positive. Will proceed with CT angio. If negative ok to proceed with discharge.  Orthostatic vital negative.   Acute blood loss anemia;  Post surgery.  Appears asymptomatic today.   Nausea, vomiting. Related to pain medication. Resolved.   COPD compensated.     Estimated body mass index is 34.15 kg/m as calculated from the following:   Height as of this encounter: 5\' 10"  (1.778 m).   Weight as of this encounter: 108 kg.   DVT prophylaxis: aspirin Code Status: Full code.  Family Communication: care discussed with patient.  Disposition Plan: if ct angio is negative for PE, ok to discharge today      Subjective: He is feeling better today, denies dyspnea. No chest pain. He did have chest pain, mid chest, sharp in quality. Associated with dyspnea.   Objective: Vitals:   09/13/18 1804 09/14/18 0217 09/14/18 0615  09/14/18 1430  BP: 121/66 114/71 123/69 112/74  Pulse: 88 71 66 72  Resp: 16 14 18 16   Temp: 98.7 F (37.1 C) 98.4 F (36.9 C) 98.1 F (36.7 C) 98.4 F (36.9 C)  TempSrc: Oral Oral Oral Oral  SpO2: 97% 97% 99% 98%  Weight:      Height:        Intake/Output Summary (Last 24 hours) at 09/14/2018 1452 Last data filed at 09/14/2018 1010 Gross per 24 hour  Intake 2090.17 ml  Output 1050 ml  Net 1040.17 ml   Filed Weights   09/12/18 1224  Weight: 108 kg    Examination:  General exam: Appears calm and comfortable  Respiratory system: Clear to auscultation. Respiratory effort normal. Cardiovascular system: S1 & S2 heard, RRR. No JVD, murmurs, rubs, gallops or clicks. No pedal edema. Gastrointestinal system: Abdomen is nondistended, soft and nontender. No organomegaly or masses felt. Normal bowel sounds heard. Central nervous system: Alert and oriented. No focal neurological deficits. Extremities: Symmetric 5 x 5 power. Left LE with dressing, edema Skin: No rashes, lesions or ulcers   Data Reviewed: I have personally reviewed following labs and imaging studies  CBC: Recent Labs  Lab 09/12/18 1230 09/13/18 0408 09/14/18 0524  WBC 4.5 8.2 11.7*  NEUTROABS 2.0  --   --   HGB 12.8* 11.2* 9.7*  HCT 37.5* 34.4* 29.6*  MCV 92.8 95.8 95.5  PLT 337 280 282   Basic Metabolic Panel: Recent Labs  Lab 09/13/18 0408  NA 137  K 3.8  CL 103  CO2 25  GLUCOSE 147*  BUN 9  CREATININE 1.04  CALCIUM 8.0*   GFR: Estimated Creatinine Clearance: 97.6 mL/min (by C-G formula based on SCr of 1.04 mg/dL). Liver Function Tests: No results for input(s): AST, ALT, ALKPHOS, BILITOT, PROT, ALBUMIN in the last 168 hours. No results for input(s): LIPASE, AMYLASE in the last 168 hours. No results for input(s): AMMONIA in the last 168 hours. Coagulation Profile: Recent Labs  Lab 09/12/18 1230  INR 0.91   Cardiac Enzymes: Recent Labs  Lab 09/14/18 0956  TROPONINI <0.03   BNP (last  3 results) No results for input(s): PROBNP in the last 8760 hours. HbA1C: No results for input(s): HGBA1C in the last 72 hours. CBG: No results for input(s): GLUCAP in the last 168 hours. Lipid Profile: No results for input(s): CHOL, HDL, LDLCALC, TRIG, CHOLHDL, LDLDIRECT in the last 72 hours. Thyroid Function Tests: No results for input(s): TSH, T4TOTAL, FREET4, T3FREE, THYROIDAB in the last 72 hours. Anemia Panel: No results for input(s): VITAMINB12, FOLATE, FERRITIN, TIBC, IRON, RETICCTPCT in the last 72 hours. Sepsis Labs: No results for input(s): PROCALCITON, LATICACIDVEN in the last 168 hours.  Recent Results (from the past 240 hour(s))  Surgical pcr screen     Status: None   Collection Time: 09/05/18 12:10 PM  Result Value Ref Range Status   MRSA, PCR NEGATIVE NEGATIVE Final   Staphylococcus aureus NEGATIVE NEGATIVE Final    Comment: (NOTE) The Xpert SA Assay (FDA approved for NASAL specimens in patients 71 years of age and older), is one component of a comprehensive surveillance program. It is not intended to diagnose infection nor to guide or monitor treatment. Performed at [facility], [address], [place], [place] [address]          Radiology Studies: Dg Chest Port 1 View  Result Date: 09/13/2018 CLINICAL DATA:  Shortness of breath EXAM: PORTABLE CHEST 1 VIEW COMPARISON:  None. FINDINGS: No focal opacity. Linear scarring or atelectasis in the left lower lung. Borderline cardiomegaly. No pneumothorax. Retrocardiac opacity and lucency, suspect moderate to large hiatal hernia IMPRESSION: 1. Linear scarring or atelectasis at the left base 2. Borderline cardiomegaly 3. Retrocardiac opacity and lucency suspected to represent moderate to large hiatal hernia Electronically Signed   By: Jasmine Pang M.D.   On: 09/13/2018 19:04        Scheduled Meds: . aspirin  81 mg Oral BID  . docusate sodium  100 mg Oral BID  . ferrous sulfate  325 mg Oral  BID WC  . gabapentin  300 mg Oral TID  . pantoprazole  40 mg Oral Daily   Continuous Infusions: . sodium chloride    . methocarbamol (ROBAXIN) IV       LOS: 0 days    Time spent: 35 minutes.     Alba Cory, MD [facility] Pager [phone]  If 7PM-7AM, please contact night-coverage [email or web address] Password [email or web address] 09/14/2018, 2:52 PM

## 2018-09-14 NOTE — Progress Notes (Signed)
PATIENT ID: CEBERT DETTMANN  MRN: [id number]  DOB/AGE:  56-Nov-1962 / 56 y.o.  2 Days Post-Op Procedure(s) (LRB): LEFT TOTAL KNEE ARTHROPLASTY (Left)    PROGRESS NOTE Subjective: Patient is alert, oriented, no Nausea, no Vomiting, yes passing gas. Taking PO well. Denies SOB, Chest or Calf Pain. Using Incentive Spirometer, PAS in place. Ambulate WBAT with pt passing therapy goals, Patient reports pain as 2/10 .    Objective: Vital signs in last 24 hours: Vitals:   09/13/18 1737 09/13/18 1804 09/14/18 0217 09/14/18 0615  BP: 126/81 121/66 114/71 123/69  Pulse: (!) 104 88 71 66  Resp:  16 14 18   Temp:  98.7 F (37.1 C) 98.4 F (36.9 C) 98.1 F (36.7 C)  TempSrc:  Oral Oral Oral  SpO2:  97% 97% 99%  Weight:      Height:          Intake/Output from previous day: I/O last 3 completed shifts: In: 5044.5 [P.O.:2180; I.V.:2864.5] Out: 3400 [Urine:3400]   Intake/Output this shift: Total I/O In: 240 [P.O.:240] Out: -    LABORATORY DATA: Recent Labs    09/12/18 1230 09/13/18 0408 09/14/18 0524  WBC 4.5 8.2 11.7*  HGB 12.8* 11.2* 9.7*  HCT 37.5* 34.4* 29.6*  PLT 337 280 282  NA  --  137  --   K  --  3.8  --   CL  --  103  --   CO2  --  25  --   BUN  --  9  --   CREATININE  --  1.04  --   GLUCOSE  --  147*  --   INR 0.91  --   --   CALCIUM  --  8.0*  --     Examination: Neurologically intact Neurovascular intact Sensation intact distally Intact pulses distally Dorsiflexion/Plantar flexion intact Incision: dressing C/D/I No cellulitis present Compartment soft}  Assessment:   2 Days Post-Op Procedure(s) (LRB): LEFT TOTAL KNEE ARTHROPLASTY (Left) ADDITIONAL DIAGNOSIS: Expected Acute Blood Loss Anemia, Sleep Apnea and COPD Anticipated LOS equal to or greater than 2 midnights due to - Age 30 and older with one or more of the following:  - Obesity  - Expected need for hospital services (PT, OT, Nursing) required for safe  discharge  - Active co-morbidities:  Respiratory Failure/COPD     Plan: PT/OT WBAT, AROM and PROM  DVT Prophylaxis:  SCDx72hrs, ASA 81 mg BID x 2 weeks DISCHARGE PLAN: Home DISCHARGE NEEDS: HHPT, Walker and 3-in-1 comode seat     Dannielle Burn 09/14/2018, 12:50 PM

## 2018-10-13 LAB — CBC WITH DIFFERENTIAL/PLATELET
BASOS: 1 %
Basophils Absolute: 0 10*3/uL (ref 0.0–0.2)
EOS (ABSOLUTE): 0.4 10*3/uL (ref 0.0–0.4)
EOS: 9 %
HEMATOCRIT: 33.3 % — AB (ref 37.5–51.0)
HEMOGLOBIN: 11.1 g/dL — AB (ref 13.0–17.7)
IMMATURE GRANS (ABS): 0 10*3/uL (ref 0.0–0.1)
Immature Granulocytes: 0 %
Lymphocytes Absolute: 1.5 10*3/uL (ref 0.7–3.1)
Lymphs: 33 %
MCH: 31.6 pg (ref 26.6–33.0)
MCHC: 33.3 g/dL (ref 31.5–35.7)
MCV: 95 fL (ref 79–97)
MONOCYTES: 13 %
MONOS ABS: 0.6 10*3/uL (ref 0.1–0.9)
NEUTROS PCT: 44 %
Neutrophils Absolute: 2 10*3/uL (ref 1.4–7.0)
Platelets: 347 10*3/uL (ref 150–450)
RBC: 3.51 x10E6/uL — AB (ref 4.14–5.80)
RDW: 13.4 % (ref 12.3–15.4)
WBC: 4.6 10*3/uL (ref 3.4–10.8)

## 2018-10-13 LAB — FERRITIN: Ferritin: 69 ng/mL (ref 30–400)

## 2019-03-22 ENCOUNTER — Other Ambulatory Visit: Payer: Self-pay

## 2019-03-22 ENCOUNTER — Ambulatory Visit (INDEPENDENT_AMBULATORY_CARE_PROVIDER_SITE_OTHER): Payer: Managed Care, Other (non HMO) | Admitting: Family Medicine

## 2019-03-22 DIAGNOSIS — J441 Chronic obstructive pulmonary disease with (acute) exacerbation: Secondary | ICD-10-CM

## 2019-03-22 MED ORDER — PREDNISONE 20 MG PO TABS: ORAL_TABLET | ORAL | 0 refills | Status: DC

## 2019-03-22 MED ORDER — AMOXICILLIN-POT CLAVULANATE 875-125 MG PO TABS: 1.0000 | ORAL_TABLET | Freq: Two times a day (BID) | ORAL | 0 refills | Status: DC

## 2019-03-22 MED ORDER — ALBUTEROL SULFATE HFA 108 (90 BASE) MCG/ACT IN AERS: 2.0000 | INHALATION_SPRAY | RESPIRATORY_TRACT | 4 refills | Status: DC | PRN

## 2019-03-22 NOTE — Progress Notes (Signed)
   Subjective:    Patient ID: Jacob Hanna, male    DOB: [birth year], 58 y.o.   MRN: [id number] Patient was seen in person Sinusitis  This is a new problem. Episode onset: 2 weeks. There has been no fever. Associated symptoms include congestion and coughing. Pertinent negatives include no chills, ear pain or shortness of breath. (Wheezing ) Treatments tried: mucinex. The treatment provided moderate relief.  dizziness started yesterday.  He relates some slight dizziness yesterday also relates head congestion drainage he also relates coughing wheezing denies fever body aches denies sweats chills has history of COPD could not afford daily medicine uses albuterol PRN Needs refill on albuterol inhaler.    Review of Systems  Constitutional: Negative for activity change, chills and fever.  HENT: Positive for congestion. Negative for ear pain and rhinorrhea.   Eyes: Negative for discharge.  Respiratory: Positive for cough and wheezing. Negative for shortness of breath.   Cardiovascular: Negative for chest pain.  Gastrointestinal: Negative for nausea and vomiting.  Musculoskeletal: Negative for arthralgias.       Objective:   Physical Exam Constitutional:      General: He is not in acute distress.    Appearance: He is well-developed.  HENT:     Head: Normocephalic.     Nose: No congestion or rhinorrhea.     Mouth/Throat:     Mouth: Mucous membranes are moist.     Pharynx: No posterior oropharyngeal erythema.  Eyes:     General:        Right eye: No discharge.        Left eye: No discharge.  Cardiovascular:     Rate and Rhythm: Normal rate and regular rhythm.     Heart sounds: Normal heart sounds. No murmur.  Pulmonary:     Effort: Pulmonary effort is normal.     Breath sounds: Wheezing present. No rhonchi or rales.  Skin:    General: Skin is warm and dry.  Neurological:     Mental Status: He is alert.  Psychiatric:        Behavior: Behavior normal.   Temperature was normal  O2 saturation 95%        Assessment & Plan:  COPD exacerbation I do not believe patient has COVID Recommend albuterol prednisone taper and azithromycin If progressive troubles or worse to follow-up Warning signs were discussed Connect with [place] if not improving over the next 48 hours Work excuse given through the weekend

## 2019-06-21 ENCOUNTER — Telehealth: Payer: Self-pay | Admitting: Family Medicine

## 2019-06-21 DIAGNOSIS — E785 Hyperlipidemia, unspecified: Secondary | ICD-10-CM

## 2019-06-21 DIAGNOSIS — Z79899 Other long term (current) drug therapy: Secondary | ICD-10-CM

## 2019-06-21 DIAGNOSIS — Z125 Encounter for screening for malignant neoplasm of prostate: Secondary | ICD-10-CM

## 2019-06-21 DIAGNOSIS — Z13220 Encounter for screening for lipoid disorders: Secondary | ICD-10-CM

## 2019-06-21 DIAGNOSIS — D509 Iron deficiency anemia, unspecified: Secondary | ICD-10-CM

## 2019-06-21 NOTE — Telephone Encounter (Signed)
Pt has a PE on 07/03/2019 - needs lab work ordered  Please call pt when done

## 2019-06-21 NOTE — Telephone Encounter (Signed)
Last labs on 10/12/18 CBC and Ferritin; please advise. Thank you

## 2019-06-22 NOTE — Telephone Encounter (Signed)
Lab orders placed and pt is aware 

## 2019-06-22 NOTE — Telephone Encounter (Signed)
Lipid, liver, PSA, metabolic 7, CBC, ferritin, Iron deficient anemia, screening labs, hyperlipidemia

## 2019-06-29 LAB — CBC WITH DIFFERENTIAL/PLATELET
Basophils Absolute: 0.1 10*3/uL (ref 0.0–0.2)
Basos: 1 %
EOS (ABSOLUTE): 0.3 10*3/uL (ref 0.0–0.4)
Eos: 7 %
Hematocrit: 43 % (ref 37.5–51.0)
Hemoglobin: 15.1 g/dL (ref 13.0–17.7)
Immature Grans (Abs): 0 10*3/uL (ref 0.0–0.1)
Immature Granulocytes: 0 %
Lymphocytes Absolute: 1.7 10*3/uL (ref 0.7–3.1)
Lymphs: 37 %
MCH: 32.1 pg (ref 26.6–33.0)
MCHC: 35.1 g/dL (ref 31.5–35.7)
MCV: 92 fL (ref 79–97)
Monocytes Absolute: 0.5 10*3/uL (ref 0.1–0.9)
Monocytes: 12 %
Neutrophils Absolute: 2.1 10*3/uL (ref 1.4–7.0)
Neutrophils: 43 %
Platelets: 264 10*3/uL (ref 150–450)
RBC: 4.7 x10E6/uL (ref 4.14–5.80)
RDW: 12.3 % (ref 11.6–15.4)
WBC: 4.7 10*3/uL (ref 3.4–10.8)

## 2019-06-29 LAB — LIPID PANEL
Chol/HDL Ratio: 5.5 ratio — ABNORMAL HIGH (ref 0.0–5.0)
Cholesterol, Total: 197 mg/dL (ref 100–199)
HDL: 36 mg/dL — ABNORMAL LOW (ref >39)
LDL Calculated: 133 mg/dL — ABNORMAL HIGH (ref 0–99)
Triglycerides: 142 mg/dL (ref 0–149)
VLDL Cholesterol Cal: 28 mg/dL (ref 5–40)

## 2019-06-29 LAB — BASIC METABOLIC PANEL
BUN/Creatinine Ratio: 16 (ref 9–20)
BUN: 16 mg/dL (ref 6–24)
CO2: 26 mmol/L (ref 20–29)
Calcium: 9.4 mg/dL (ref 8.7–10.2)
Chloride: 102 mmol/L (ref 96–106)
Creatinine, Ser: 1 mg/dL (ref 0.76–1.27)
GFR calc Af Amer: 97 mL/min/{1.73_m2} (ref >59)
GFR calc non Af Amer: 84 mL/min/{1.73_m2} (ref >59)
Glucose: 108 mg/dL — ABNORMAL HIGH (ref 65–99)
Potassium: 4.7 mmol/L (ref 3.5–5.2)
Sodium: 142 mmol/L (ref 134–144)

## 2019-06-29 LAB — HEPATIC FUNCTION PANEL
ALT: 12 IU/L (ref 0–44)
AST: 20 IU/L (ref 0–40)
Albumin: 4.7 g/dL (ref 3.8–4.9)
Alkaline Phosphatase: 123 IU/L — ABNORMAL HIGH (ref 39–117)
Bilirubin Total: 0.6 mg/dL (ref 0.0–1.2)
Bilirubin, Direct: 0.14 mg/dL (ref 0.00–0.40)
Total Protein: 7.2 g/dL (ref 6.0–8.5)

## 2019-06-29 LAB — FERRITIN: Ferritin: 80 ng/mL (ref 30–400)

## 2019-06-29 LAB — PSA: Prostate Specific Ag, Serum: 0.7 ng/mL (ref 0.0–4.0)

## 2019-07-03 ENCOUNTER — Other Ambulatory Visit: Payer: Self-pay

## 2019-07-03 ENCOUNTER — Ambulatory Visit (INDEPENDENT_AMBULATORY_CARE_PROVIDER_SITE_OTHER): Payer: Managed Care, Other (non HMO) | Admitting: Family Medicine

## 2019-07-03 ENCOUNTER — Encounter: Payer: Self-pay | Admitting: Family Medicine

## 2019-07-03 VITALS — BP 114/78 | Temp 97.0°F | Ht 70.0 in | Wt 233.0 lb

## 2019-07-03 DIAGNOSIS — Z0000 Encounter for general adult medical examination without abnormal findings: Secondary | ICD-10-CM | POA: Diagnosis not present

## 2019-07-03 NOTE — Progress Notes (Signed)
Subjective:    Patient ID: Jacob Hanna, male    DOB: [birth year], 58 y.o.   MRN: [id number]  HPI The patient comes in today for a wellness visit.  Patient overall doing fairly well has history of iron deficient anemia did do his lab work.  Tries to watch his diet is cutting back on food is staying active trying to lose weight difficult for him to do so he denies any type of chest tightness pressure pain shortness of breath denies being depressed handling the stress of all of this fairly well  A review of their health history was completed.  A review of medications was also completed.  Any needed refills; none  Eating habits: health conscious  Falls/  MVA accidents in past few months: none  Regular exercise: yard work, Scientist, forensic at summer camp  Specialist pt sees on regular basis: none  Preventative health issues were discussed.   Additional concerns: copd. Breathing has been worse this summer. Would like to discuss getting an affordable inhaler.     Review of Systems  Constitutional: Negative for activity change, appetite change and fever.  HENT: Negative for congestion and rhinorrhea.   Eyes: Negative for discharge.  Respiratory: Negative for cough and wheezing.   Cardiovascular: Negative for chest pain.  Gastrointestinal: Negative for abdominal pain, blood in stool and vomiting.  Genitourinary: Negative for difficulty urinating and frequency.  Musculoskeletal: Negative for neck pain.  Skin: Negative for rash.  Allergic/Immunologic: Negative for environmental allergies and food allergies.  Neurological: Negative for weakness and headaches.  Psychiatric/Behavioral: Negative for agitation.       Objective:   Physical Exam Constitutional:      Appearance: He is well-developed.  HENT:     Head: Normocephalic and atraumatic.     Right Ear: External ear normal.     Left Ear: External ear normal.     Nose: Nose normal.  Eyes:     Pupils: Pupils are equal, round,  and reactive to light.  Neck:     Musculoskeletal: Normal range of motion and neck supple.     Thyroid: No thyromegaly.  Cardiovascular:     Rate and Rhythm: Normal rate and regular rhythm.     Heart sounds: Normal heart sounds. No murmur.  Pulmonary:     Effort: Pulmonary effort is normal. No respiratory distress.     Breath sounds: Normal breath sounds. No wheezing.  Abdominal:     General: Bowel sounds are normal. There is no distension.     Palpations: Abdomen is soft. There is no mass.     Tenderness: There is no abdominal tenderness.  Genitourinary:    Penis: Normal.   Musculoskeletal: Normal range of motion.  Lymphadenopathy:     Cervical: No cervical adenopathy.  Skin:    General: Skin is warm and dry.     Findings: No erythema.  Neurological:     Mental Status: He is alert.     Motor: No abnormal muscle tone.  Psychiatric:        Behavior: Behavior normal.        Judgment: Judgment normal.    Prostate exam normal.  Cardiac exam normal.     Assessment & Plan:  Adult wellness-complete.wellness physical was conducted today. Importance of diet and exercise were discussed in detail.  In addition to this a discussion regarding safety was also covered. We also reviewed over immunizations and gave recommendations regarding current immunization needed for age.  In addition to this  additional areas were also touched on including: Preventative health exams needed:  Colonoscopy 2023  Patient was advised yearly wellness exam  Mild hyperlipidemia increased risk of heart disease we did discuss medications versus diet patient wants to try diet first we will follow-up again in 6 months  COPD issues we did discuss how daily inhalers are very expensive we discussed these in detail he would like to stick with albuterol PRN  Shin Grix vaccine recommended

## 2019-07-13 ENCOUNTER — Encounter (HOSPITAL_COMMUNITY): Payer: Self-pay

## 2019-07-13 ENCOUNTER — Ambulatory Visit (HOSPITAL_COMMUNITY)
Admission: RE | Admit: 2019-07-13 | Discharge: 2019-07-13 | Disposition: A | Discharge status: Home / Self Care | Payer: Managed Care, Other (non HMO) | Source: Ambulatory Visit | Attending: Family Medicine | Admitting: Family Medicine

## 2019-07-13 ENCOUNTER — Ambulatory Visit (INDEPENDENT_AMBULATORY_CARE_PROVIDER_SITE_OTHER): Payer: Managed Care, Other (non HMO) | Admitting: Family Medicine

## 2019-07-13 ENCOUNTER — Other Ambulatory Visit: Payer: Self-pay

## 2019-07-13 VITALS — BP 120/84 | Temp 98.0°F | Ht 70.0 in | Wt 239.0 lb

## 2019-07-13 DIAGNOSIS — S6702XA Crushing injury of left thumb, initial encounter: Secondary | ICD-10-CM

## 2019-07-13 DIAGNOSIS — Z23 Encounter for immunization: Secondary | ICD-10-CM

## 2019-07-13 DIAGNOSIS — M79642 Pain in left hand: Secondary | ICD-10-CM | POA: Diagnosis not present

## 2019-07-13 MED ORDER — CEPHALEXIN 500 MG PO CAPS: 500.0000 mg | ORAL_CAPSULE | Freq: Three times a day (TID) | ORAL | 0 refills | Status: DC

## 2019-07-13 MED ORDER — MUPIROCIN 2 % EX OINT: 1.0000 "application " | TOPICAL_OINTMENT | Freq: Two times a day (BID) | CUTANEOUS | 0 refills | Status: DC

## 2019-07-13 NOTE — Progress Notes (Signed)
   Subjective:    Patient ID: Jacob Hanna, male    DOB: 08-21-1961, 58 y.o.   MRN: [id number]  HPIHit left thumb with a hammer yesterday. Having pain and swelling.  Patient has not had a Tdap for 8 years  Patient had quite a bit of bleeding  No history of prior injury to the thumb  Able to move the thumb joint without difficulty however there is a throbbing pain in the distal thumb    Review of Systems No headache, no major weight loss or weight gain, no chest pain no back pain abdominal pain no change in bowel habits complete ROS otherwise negative     Objective:   Physical Exam Alert vitals stable, NAD. Blood pressure good on repeat. HEENT normal. Lungs clear. Heart regular rate and rhythm. Right great thumb contusion evident.  Some bleeding.  Distal crush injury.  Blood under the nail but not under tension  Sent for x-rays       Assessment & Plan:  Impression contusion of thumb with hematoma and slight distal laceration of thumb.  Seen a 18 hours post injury noted timeframe for any possible suture in the distal part of the injury is past.  Addendum x-ray shows tuft comminuted crush injury.  Antibiotics prescribed.  Splint next couple weeks expect loss of nail warning signs discussed

## 2019-07-14 ENCOUNTER — Telehealth: Payer: Self-pay | Admitting: *Deleted

## 2019-07-14 MED ORDER — HYDROCODONE-ACETAMINOPHEN 5-325 MG PO TABS: ORAL_TABLET | ORAL | 0 refills | Status: DC

## 2019-07-14 NOTE — Telephone Encounter (Signed)
Called pt to let him know that dr Richardson Landry reviewed his xray and he has a tuft fracture. Should heal on its own. Get a splint from [place] [facility] and wear for a couple of weeks to protect thumb. No need to see ortho per dr Richardson Landry. Pt verbalized understanding of all.
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
## 2019-11-28 ENCOUNTER — Ambulatory Visit (INDEPENDENT_AMBULATORY_CARE_PROVIDER_SITE_OTHER): Payer: Managed Care, Other (non HMO) | Admitting: Family Medicine

## 2019-11-28 ENCOUNTER — Encounter (HOSPITAL_COMMUNITY): Payer: Self-pay

## 2019-11-28 ENCOUNTER — Other Ambulatory Visit: Payer: Self-pay

## 2019-11-28 ENCOUNTER — Ambulatory Visit (HOSPITAL_COMMUNITY)
Admission: RE | Admit: 2019-11-28 | Discharge: 2019-11-28 | Disposition: A | Discharge status: Home / Self Care | Payer: Managed Care, Other (non HMO) | Source: Ambulatory Visit | Attending: Family Medicine | Admitting: Family Medicine

## 2019-11-28 DIAGNOSIS — R05 Cough: Secondary | ICD-10-CM | POA: Insufficient documentation

## 2019-11-28 DIAGNOSIS — R059 Cough, unspecified: Secondary | ICD-10-CM

## 2019-11-28 DIAGNOSIS — J4521 Mild intermittent asthma with (acute) exacerbation: Secondary | ICD-10-CM

## 2019-11-28 MED ORDER — ALBUTEROL SULFATE HFA 108 (90 BASE) MCG/ACT IN AERS: 2.0000 | INHALATION_SPRAY | RESPIRATORY_TRACT | 4 refills | Status: DC | PRN

## 2019-11-28 MED ORDER — AZITHROMYCIN 250 MG PO TABS: ORAL_TABLET | ORAL | 0 refills | Status: DC

## 2019-11-28 MED ORDER — PREDNISONE 20 MG PO TABS: ORAL_TABLET | ORAL | 0 refills | Status: DC

## 2019-11-28 NOTE — Progress Notes (Signed)
   Subjective:    Patient ID: Jacob Hanna, male    DOB: [birth year], 59 y.o.   MRN: [id number]  Cough This is a new problem. Episode onset: 3 weeks. Associated symptoms include shortness of breath and wheezing. Pertinent negatives include no chest pain, chills, ear pain, fever or rhinorrhea. Treatments tried: mucus relief med, albuterol.  Over the past several weeks had persistent cough increasing cough with some shortness of breath and chest congestion denies high fever chills sweats denies diarrhea.  Relating some wheezing has some history of COPD but is not on any inhalers on a regular basis.  Recently he has had use albuterol much more frequently but before that infrequently Virtual Visit via Telephone Note  I connected with Jacob Hanna on 11/28/19 at  3:00 PM EST by telephone and verified that I am speaking with the correct person using two identifiers.  Location: Patient: home Provider: office   I discussed the limitations, risks, security and privacy concerns of performing an evaluation and management service by telephone and the availability of in person appointments. I also discussed with the patient that there may be a patient responsible charge related to this service. The patient expressed understanding and agreed to proceed.   History of Present Illness:    Observations/Objective:   Assessment and Plan:   Follow Up Instructions:    I discussed the assessment and treatment plan with the patient. The patient was provided an opportunity to ask questions and all were answered. The patient agreed with the plan and demonstrated an understanding of the instructions.   The patient was advised to call back or seek an in-person evaluation if the symptoms worsen or if the condition fails to improve as anticipated.  I provided 17 minutes of non-face-to-face time during this encounter.        Review of Systems  Constitutional: Negative for activity change, chills  and fever.  HENT: Positive for congestion. Negative for ear pain and rhinorrhea.   Eyes: Negative for discharge.  Respiratory: Positive for cough, shortness of breath and wheezing.   Cardiovascular: Negative for chest pain.  Gastrointestinal: Negative for nausea and vomiting.  Musculoskeletal: Negative for arthralgias.       Objective:   Physical Exam Vitals and nursing note reviewed.  Constitutional:      Appearance: He is well-developed.  HENT:     Head: Normocephalic.     Mouth/Throat:     Pharynx: No oropharyngeal exudate.  Cardiovascular:     Rate and Rhythm: Normal rate and regular rhythm.     Heart sounds: Normal heart sounds. No murmur.  Pulmonary:     Effort: Pulmonary effort is normal.     Breath sounds: Wheezing present.  Musculoskeletal:     Cervical back: Normal range of motion.  Lymphadenopathy:     Cervical: No cervical adenopathy.  Skin:    General: Skin is warm and dry.  Neurological:     Motor: No abnormal muscle tone.    O2 saturation 95%       Assessment & Plan:  COPD flareup along with reactive airway very important for the patient do albuterol on a regular basis also prednisone taper over the next 9 days plus also antibiotics prescribed stat chest x-ray ordered I also recommend for the patient to do Covid testing he will do this tomorrow  Chest x-ray came back negative.  Patient will get his medications.  Also recommended Covid testing.  Warning signs were discussed.

## 2019-11-29 ENCOUNTER — Ambulatory Visit: Payer: Managed Care, Other (non HMO) | Attending: Internal Medicine

## 2019-11-29 DIAGNOSIS — Z20822 Contact with and (suspected) exposure to covid-19: Secondary | ICD-10-CM

## 2019-12-01 LAB — NOVEL CORONAVIRUS, NAA: SARS-CoV-2, NAA: NOT DETECTED

## 2019-12-07 ENCOUNTER — Telehealth: Payer: Self-pay | Admitting: *Deleted

## 2019-12-07 ENCOUNTER — Other Ambulatory Visit: Payer: Self-pay | Admitting: *Deleted

## 2019-12-07 DIAGNOSIS — R7303 Prediabetes: Secondary | ICD-10-CM

## 2019-12-07 DIAGNOSIS — E785 Hyperlipidemia, unspecified: Secondary | ICD-10-CM

## 2019-12-07 NOTE — Telephone Encounter (Signed)
Pt in reminder file for bloodwork Manson Passey, Autumn S, RN  [facility]  Lipid and fasting glucose 12/2019 Orders put in and mailed to pt with note on it to do bw fasting in February.

## 2020-01-02 LAB — GLUCOSE, RANDOM: Glucose: 95 mg/dL (ref 65–99)

## 2020-01-02 LAB — LIPID PANEL
Chol/HDL Ratio: 4.2 ratio (ref 0.0–5.0)
Cholesterol, Total: 177 mg/dL (ref 100–199)
HDL: 42 mg/dL (ref >39)
LDL Chol Calc (NIH): 114 mg/dL — ABNORMAL HIGH (ref 0–99)
Triglycerides: 118 mg/dL (ref 0–149)
VLDL Cholesterol Cal: 21 mg/dL (ref 5–40)

## 2020-01-30 ENCOUNTER — Other Ambulatory Visit: Payer: Self-pay

## 2020-01-30 ENCOUNTER — Ambulatory Visit (INDEPENDENT_AMBULATORY_CARE_PROVIDER_SITE_OTHER): Payer: Managed Care, Other (non HMO) | Admitting: Family Medicine

## 2020-01-30 DIAGNOSIS — J019 Acute sinusitis, unspecified: Secondary | ICD-10-CM

## 2020-01-30 DIAGNOSIS — J4521 Mild intermittent asthma with (acute) exacerbation: Secondary | ICD-10-CM | POA: Diagnosis not present

## 2020-01-30 MED ORDER — PREDNISONE 20 MG PO TABS: ORAL_TABLET | ORAL | 0 refills | Status: DC

## 2020-01-30 MED ORDER — AZITHROMYCIN 250 MG PO TABS: ORAL_TABLET | ORAL | 0 refills | Status: DC

## 2020-01-30 MED ORDER — FLOVENT HFA 220 MCG/ACT IN AERO: INHALATION_SPRAY | RESPIRATORY_TRACT | 12 refills | Status: DC

## 2020-01-30 NOTE — Progress Notes (Signed)
   Subjective:    Patient ID: Jacob Hanna, male    DOB: 1960/11/18, 59 y.o.   MRN: [id number]  Sinusitis This is a new problem. Episode onset: 2 -3 weeks. Associated symptoms include congestion, coughing and shortness of breath. Treatments tried: mucinex, sudafed, zyrtec.  Patient has ongoing asthma issues had a flareup a couple months ago.  States he uses his inhaler sometimes once a day sometimes several times a day patient does not smoke denies any Covid symptoms does not feel he sick with Covid Has not been able to afford steroid inhalers because of his high deductible.  Therefore he just uses albuterol and treats his flareups. Virtual Visit via Telephone Note  I connected with [name] on 01/30/20 at  9:30 AM EDT by telephone and verified that I am speaking with the correct person using two identifiers.  Location: Patient: home Provider: office   I discussed the limitations, risks, security and privacy concerns of performing an evaluation and management service by telephone and the availability of in person appointments. I also discussed with the patient that there may be a patient responsible charge related to this service. The patient expressed understanding and agreed to proceed.   History of Present Illness:    Observations/Objective:   Assessment and Plan:   Follow Up Instructions:    I discussed the assessment and treatment plan with the patient. The patient was provided an opportunity to ask questions and all were answered. The patient agreed with the plan and demonstrated an understanding of the instructions.   The patient was advised to call back or seek an in-person evaluation if the symptoms worsen or if the condition fails to improve as anticipated.  I provided 16 minutes of non-face-to-face time during this encounter.       Review of Systems  HENT: Positive for congestion.   Respiratory: Positive for cough and shortness of breath.          Objective:   Physical Exam  Virtual exam unable to do physical      Assessment & Plan:  Reactive airway asthma flareup Prednisone taper Antibiotic to cover for infection Warning signs were discussed in detail Follow-up if progressive troubles or problems Also printout prescription for Flovent mailed to the patient he will try to shop around and find a good price on this and start this on a regular basis to try to minimize flareups. Patient has a high deductible states he cannot afford a lot out-of-pocket If he gets worse he is to call back we will check him in person

## 2020-02-01 ENCOUNTER — Ambulatory Visit: Payer: Managed Care, Other (non HMO)

## 2020-02-02 ENCOUNTER — Ambulatory Visit: Payer: Managed Care, Other (non HMO) | Attending: Internal Medicine

## 2020-02-02 DIAGNOSIS — Z23 Encounter for immunization: Secondary | ICD-10-CM

## 2020-02-02 NOTE — Progress Notes (Signed)
   Covid-19 Vaccination Clinic  Name:  Jacob Hanna    MRN: [id number] DOB: June 26, 1961  02/02/2020  [name] was observed post Covid-19 immunization for 15 minutes without incident. He was provided with Vaccine Information Sheet and instruction to access the V-Safe system.   [name] was instructed to call 911 with any severe reactions post vaccine: Marland Kitchen Difficulty breathing  . Swelling of face and throat  . A fast heartbeat  . A bad rash all over body  . Dizziness and weakness   Immunizations Administered    Name Date Dose VIS Date Route   Moderna COVID-19 Vaccine 02/02/2020  8:40 AM 0.5 mL 10/10/2019 Intramuscular   Manufacturer: Moderna   Lot: [id number]   NDC: 97915-041-36

## 2020-02-05 ENCOUNTER — Encounter: Payer: Self-pay | Admitting: Family Medicine

## 2020-02-06 MED ORDER — DULERA 100-5 MCG/ACT IN AERO: INHALATION_SPRAY | RESPIRATORY_TRACT | 6 refills | Status: DC

## 2020-02-06 NOTE — Addendum Note (Signed)
Addended by: Marlowe Shores on: 02/06/2020 02:02 PM   Modules accepted: Orders

## 2020-02-06 NOTE — Telephone Encounter (Signed)
Nurses I recommend [facility] which is 100 mcg / 50 mcg, 2 puffs twice daily, rinse after use, 6 refills  May send this into the [facility] pharmacy as requested  Please also forward the following message to the patient  Jacob Hanna is a dual medication with anti-inflammatory as well as a long-acting bronchodilator.  It should dramatically cut down the amount of flareups you are having.  If doing really well over the next several months you could even try tapering off of the [facility] and only using albuterol when necessary but we can coach you more on how to do this on a future follow-up.  Remember to do your yearly wellness.  If you feel you are having any troubles with the medicine let us know  It is okay to use the albuterol when necessary with this medicine but hopefully you will have to use the albuterol much  Thanks-Dr. Lorin Picket

## 2020-03-06 ENCOUNTER — Ambulatory Visit: Payer: Managed Care, Other (non HMO) | Attending: Internal Medicine

## 2020-03-06 DIAGNOSIS — Z23 Encounter for immunization: Secondary | ICD-10-CM

## 2020-03-06 NOTE — Progress Notes (Signed)
   Covid-19 Vaccination Clinic  Name:  EUGENE ISADORE    MRN: [id number] DOB: 1961/02/18  03/06/2020  Mr. Kimes was observed post Covid-19 immunization for 15 minutes without incident. He was provided with Vaccine Information Sheet and instruction to access the V-Safe system.   Mr. College was instructed to call 911 with any severe reactions post vaccine: Marland Kitchen Difficulty breathing  . Swelling of face and throat  . A fast heartbeat  . A bad rash all over body  . Dizziness and weakness   Immunizations Administered    Name Date Dose VIS Date Route   Moderna COVID-19 Vaccine 03/06/2020  9:02 AM 0.5 mL 10/2019 Intramuscular   Manufacturer: Moderna   Lot: [id number]   NDC: 67209-198-02

## 2020-05-03 ENCOUNTER — Telehealth: Payer: Self-pay | Admitting: Family Medicine

## 2020-05-03 ENCOUNTER — Other Ambulatory Visit: Payer: Self-pay | Admitting: *Deleted

## 2020-05-03 DIAGNOSIS — Z125 Encounter for screening for malignant neoplasm of prostate: Secondary | ICD-10-CM

## 2020-05-03 DIAGNOSIS — D509 Iron deficiency anemia, unspecified: Secondary | ICD-10-CM

## 2020-05-03 DIAGNOSIS — Z79899 Other long term (current) drug therapy: Secondary | ICD-10-CM

## 2020-05-03 DIAGNOSIS — Z13220 Encounter for screening for lipoid disorders: Secondary | ICD-10-CM

## 2020-05-03 DIAGNOSIS — R7303 Prediabetes: Secondary | ICD-10-CM

## 2020-05-03 DIAGNOSIS — E119 Type 2 diabetes mellitus without complications: Secondary | ICD-10-CM

## 2020-05-03 DIAGNOSIS — E785 Hyperlipidemia, unspecified: Secondary | ICD-10-CM

## 2020-05-03 NOTE — Telephone Encounter (Signed)
Pt notified, labs ordered 

## 2020-05-03 NOTE — Telephone Encounter (Signed)
Last labs 01/01/20 lipid, glucose

## 2020-05-03 NOTE — Telephone Encounter (Signed)
I recommend CBC, lipid, liver, metabolic 7, PSA, ferritin, A1c  Diagnosis Iron deficient anemia, hyperlipidemia, prediabetes, screening for prostate cancer

## 2020-05-03 NOTE — Telephone Encounter (Signed)
Pt schedule Phy for 8/3 needs blood work

## 2020-06-01 LAB — CBC WITH DIFFERENTIAL/PLATELET
Basophils Absolute: 0 10*3/uL (ref 0.0–0.2)
Basos: 1 %
EOS (ABSOLUTE): 0.2 10*3/uL (ref 0.0–0.4)
Eos: 4 %
Hematocrit: 44 % (ref 37.5–51.0)
Hemoglobin: 15.3 g/dL (ref 13.0–17.7)
Immature Grans (Abs): 0 10*3/uL (ref 0.0–0.1)
Immature Granulocytes: 0 %
Lymphocytes Absolute: 1.8 10*3/uL (ref 0.7–3.1)
Lymphs: 39 %
MCH: 32.6 pg (ref 26.6–33.0)
MCHC: 34.8 g/dL (ref 31.5–35.7)
MCV: 94 fL (ref 79–97)
Monocytes Absolute: 0.5 10*3/uL (ref 0.1–0.9)
Monocytes: 11 %
Neutrophils Absolute: 2 10*3/uL (ref 1.4–7.0)
Neutrophils: 45 %
Platelets: 278 10*3/uL (ref 150–450)
RBC: 4.69 x10E6/uL (ref 4.14–5.80)
RDW: 12.2 % (ref 11.6–15.4)
WBC: 4.5 10*3/uL (ref 3.4–10.8)

## 2020-06-01 LAB — LIPID PANEL
Chol/HDL Ratio: 4.8 ratio (ref 0.0–5.0)
Cholesterol, Total: 188 mg/dL (ref 100–199)
HDL: 39 mg/dL — ABNORMAL LOW (ref >39)
LDL Chol Calc (NIH): 129 mg/dL — ABNORMAL HIGH (ref 0–99)
Triglycerides: 109 mg/dL (ref 0–149)
VLDL Cholesterol Cal: 20 mg/dL (ref 5–40)

## 2020-06-01 LAB — HEPATIC FUNCTION PANEL
ALT: 11 IU/L (ref 0–44)
AST: 16 IU/L (ref 0–40)
Albumin: 4.5 g/dL (ref 3.8–4.9)
Alkaline Phosphatase: 129 IU/L — ABNORMAL HIGH (ref 48–121)
Bilirubin Total: 0.6 mg/dL (ref 0.0–1.2)
Bilirubin, Direct: 0.13 mg/dL (ref 0.00–0.40)
Total Protein: 7.1 g/dL (ref 6.0–8.5)

## 2020-06-01 LAB — FERRITIN: Ferritin: 98 ng/mL (ref 30–400)

## 2020-06-01 LAB — HEMOGLOBIN A1C
Est. average glucose Bld gHb Est-mCnc: 126 mg/dL
Hgb A1c MFr Bld: 6 % — ABNORMAL HIGH (ref 4.8–5.6)

## 2020-06-01 LAB — BASIC METABOLIC PANEL
BUN/Creatinine Ratio: 11 (ref 9–20)
BUN: 12 mg/dL (ref 6–24)
CO2: 25 mmol/L (ref 20–29)
Calcium: 9.6 mg/dL (ref 8.7–10.2)
Chloride: 103 mmol/L (ref 96–106)
Creatinine, Ser: 1.06 mg/dL (ref 0.76–1.27)
GFR calc Af Amer: 90 mL/min/{1.73_m2} (ref >59)
GFR calc non Af Amer: 78 mL/min/{1.73_m2} (ref >59)
Glucose: 99 mg/dL (ref 65–99)
Potassium: 5.3 mmol/L — ABNORMAL HIGH (ref 3.5–5.2)
Sodium: 141 mmol/L (ref 134–144)

## 2020-06-01 LAB — PSA: Prostate Specific Ag, Serum: 0.5 ng/mL (ref 0.0–4.0)

## 2020-06-11 ENCOUNTER — Other Ambulatory Visit: Payer: Self-pay

## 2020-06-11 ENCOUNTER — Ambulatory Visit (INDEPENDENT_AMBULATORY_CARE_PROVIDER_SITE_OTHER): Payer: Managed Care, Other (non HMO) | Admitting: Family Medicine

## 2020-06-11 ENCOUNTER — Encounter: Payer: Self-pay | Admitting: Family Medicine

## 2020-06-11 VITALS — BP 134/86 | HR 84 | Temp 97.7°F | Ht 69.4 in | Wt 220.4 lb

## 2020-06-11 DIAGNOSIS — Z0000 Encounter for general adult medical examination without abnormal findings: Secondary | ICD-10-CM | POA: Diagnosis not present

## 2020-06-11 NOTE — Patient Instructions (Signed)
DASH Eating Plan DASH stands for "Dietary Approaches to Stop Hypertension." The DASH eating plan is a healthy eating plan that has been shown to reduce high blood pressure (hypertension). It may also reduce your risk for type 2 diabetes, heart disease, and stroke. The DASH eating plan may also help with weight loss. What are tips for following this plan?  General guidelines  Avoid eating more than 2,300 mg (milligrams) of salt (sodium) a day. If you have hypertension, you may need to reduce your sodium intake to 1,500 mg a day.  Limit alcohol intake to no more than 1 drink a day for nonpregnant women and 2 drinks a day for men. One drink equals 12 oz of beer, 5 oz of wine, or 1 oz of hard liquor.  Work with your health care provider to maintain a healthy body weight or to lose weight. Ask what an ideal weight is for you.  Get at least 30 minutes of exercise that causes your heart to beat faster (aerobic exercise) most days of the week. Activities may include walking, swimming, or biking.  Work with your health care provider or diet and nutrition specialist (dietitian) to adjust your eating plan to your individual calorie needs. Reading food labels   Check food labels for the amount of sodium per serving. Choose foods with less than 5 percent of the Daily Value of sodium. Generally, foods with less than 300 mg of sodium per serving fit into this eating plan.  To find whole grains, look for the word "whole" as the first word in the ingredient list. Shopping  Buy products labeled as "low-sodium" or "no salt added."  Buy fresh foods. Avoid canned foods and premade or frozen meals. Cooking  Avoid adding salt when cooking. Use salt-free seasonings or herbs instead of table salt or sea salt. Check with your health care provider or pharmacist before using salt substitutes.  Do not fry foods. Cook foods using healthy methods such as baking, boiling, grilling, and broiling instead.  Cook with  heart-healthy oils, such as olive, canola, soybean, or sunflower oil. Meal planning  Eat a balanced diet that includes: ? 5 or more servings of fruits and vegetables each day. At each meal, try to fill half of your plate with fruits and vegetables. ? Up to 6-8 servings of whole grains each day. ? Less than 6 oz of lean meat, poultry, or fish each day. A 3-oz serving of meat is about the same size as a deck of cards. One egg equals 1 oz. ? 2 servings of low-fat dairy each day. ? A serving of nuts, seeds, or beans 5 times each week. ? Heart-healthy fats. Healthy fats called Omega-3 fatty acids are found in foods such as flaxseeds and coldwater fish, like sardines, salmon, and mackerel.  Limit how much you eat of the following: ? Canned or prepackaged foods. ? Food that is high in trans fat, such as fried foods. ? Food that is high in saturated fat, such as fatty meat. ? Sweets, desserts, sugary drinks, and other foods with added sugar. ? Full-fat dairy products.  Do not salt foods before eating.  Try to eat at least 2 vegetarian meals each week.  Eat more home-cooked food and less restaurant, buffet, and fast food.  When eating at a restaurant, ask that your food be prepared with less salt or no salt, if possible. What foods are recommended? The items listed may not be a complete list. Talk with your dietitian about   what dietary choices are best for you. Grains Whole-grain or whole-wheat bread. Whole-grain or whole-wheat pasta. Brown rice. Oatmeal. Quinoa. Bulgur. Whole-grain and low-sodium cereals. Pita bread. Low-fat, low-sodium crackers. Whole-wheat flour tortillas. Vegetables Fresh or frozen vegetables (raw, steamed, roasted, or grilled). Low-sodium or reduced-sodium tomato and vegetable juice. Low-sodium or reduced-sodium tomato sauce and tomato paste. Low-sodium or reduced-sodium canned vegetables. Fruits All fresh, dried, or frozen fruit. Canned fruit in natural juice (without  added sugar). Meat and other protein foods Skinless chicken or turkey. Ground chicken or turkey. Pork with fat trimmed off. Fish and seafood. Egg whites. Dried beans, peas, or lentils. Unsalted nuts, nut butters, and seeds. Unsalted canned beans. Lean cuts of beef with fat trimmed off. Low-sodium, lean deli meat. Dairy Low-fat (1%) or fat-free (skim) milk. Fat-free, low-fat, or reduced-fat cheeses. Nonfat, low-sodium ricotta or cottage cheese. Low-fat or nonfat yogurt. Low-fat, low-sodium cheese. Fats and oils Soft margarine without trans fats. Vegetable oil. Low-fat, reduced-fat, or light mayonnaise and salad dressings (reduced-sodium). Canola, safflower, olive, soybean, and sunflower oils. Avocado. Seasoning and other foods Herbs. Spices. Seasoning mixes without salt. Unsalted popcorn and pretzels. Fat-free sweets. What foods are not recommended? The items listed may not be a complete list. Talk with your dietitian about what dietary choices are best for you. Grains Baked goods made with fat, such as croissants, muffins, or some breads. Dry pasta or rice meal packs. Vegetables Creamed or fried vegetables. Vegetables in a cheese sauce. Regular canned vegetables (not low-sodium or reduced-sodium). Regular canned tomato sauce and paste (not low-sodium or reduced-sodium). Regular tomato and vegetable juice (not low-sodium or reduced-sodium). Pickles. Olives. Fruits Canned fruit in a light or heavy syrup. Fried fruit. Fruit in cream or butter sauce. Meat and other protein foods Fatty cuts of meat. Ribs. Fried meat. Bacon. Sausage. Bologna and other processed lunch meats. Salami. Fatback. Hotdogs. Bratwurst. Salted nuts and seeds. Canned beans with added salt. Canned or smoked fish. Whole eggs or egg yolks. Chicken or turkey with skin. Dairy Whole or 2% milk, cream, and half-and-half. Whole or full-fat cream cheese. Whole-fat or sweetened yogurt. Full-fat cheese. Nondairy creamers. Whipped toppings.  Processed cheese and cheese spreads. Fats and oils Butter. Stick margarine. Lard. Shortening. Ghee. Bacon fat. Tropical oils, such as coconut, palm kernel, or palm oil. Seasoning and other foods Salted popcorn and pretzels. Onion salt, garlic salt, seasoned salt, table salt, and sea salt. Worcestershire sauce. Tartar sauce. Barbecue sauce. Teriyaki sauce. Soy sauce, including reduced-sodium. Steak sauce. Canned and packaged gravies. Fish sauce. Oyster sauce. Cocktail sauce. Horseradish that you find on the shelf. Ketchup. Mustard. Meat flavorings and tenderizers. Bouillon cubes. Hot sauce and Tabasco sauce. Premade or packaged marinades. Premade or packaged taco seasonings. Relishes. Regular salad dressings. Where to find more information:  National Heart, Lung, and Blood Institute: www.nhlbi.nih.gov  American Heart Association: www.heart.org Summary  The DASH eating plan is a healthy eating plan that has been shown to reduce high blood pressure (hypertension). It may also reduce your risk for type 2 diabetes, heart disease, and stroke.  With the DASH eating plan, you should limit salt (sodium) intake to 2,300 mg a day. If you have hypertension, you may need to reduce your sodium intake to 1,500 mg a day.  When on the DASH eating plan, aim to eat more fresh fruits and vegetables, whole grains, lean proteins, low-fat dairy, and heart-healthy fats.  Work with your health care provider or diet and nutrition specialist (dietitian) to adjust your eating plan to your   individual calorie needs. This information is not intended to replace advice given to you by your health care provider. Make sure you discuss any questions you have with your health care provider. Document Revised: 10/08/2017 Document Reviewed: 10/19/2016 Elsevier Patient Education  2020 Elsevier Inc.   Diabetes Mellitus and Nutrition, Adult When you have diabetes (diabetes mellitus), it is very important to have healthy eating  habits because your blood sugar (glucose) levels are greatly affected by what you eat and drink. Eating healthy foods in the appropriate amounts, at about the same times every day, can help you:  Control your blood glucose.  Lower your risk of heart disease.  Improve your blood pressure.  Reach or maintain a healthy weight. Every person with diabetes is different, and each person has different needs for a meal plan. Your health care provider may recommend that you work with a diet and nutrition specialist (dietitian) to make a meal plan that is best for you. Your meal plan may vary depending on factors such as:  The calories you need.  The medicines you take.  Your weight.  Your blood glucose, blood pressure, and cholesterol levels.  Your activity level.  Other health conditions you have, such as heart or kidney disease. How do carbohydrates affect me? Carbohydrates, also called carbs, affect your blood glucose level more than any other type of food. Eating carbs naturally raises the amount of glucose in your blood. Carb counting is a method for keeping track of how many carbs you eat. Counting carbs is important to keep your blood glucose at a healthy level, especially if you use insulin or take certain oral diabetes medicines. It is important to know how many carbs you can safely have in each meal. This is different for every person. Your dietitian can help you calculate how many carbs you should have at each meal and for each snack. Foods that contain carbs include:  Bread, cereal, rice, pasta, and crackers.  Potatoes and corn.  Peas, beans, and lentils.  Milk and yogurt.  Fruit and juice.  Desserts, such as cakes, cookies, ice cream, and candy. How does alcohol affect me? Alcohol can cause a sudden decrease in blood glucose (hypoglycemia), especially if you use insulin or take certain oral diabetes medicines. Hypoglycemia can be a life-threatening condition. Symptoms of  hypoglycemia (sleepiness, dizziness, and confusion) are similar to symptoms of having too much alcohol. If your health care provider says that alcohol is safe for you, follow these guidelines:  Limit alcohol intake to no more than 1 drink per day for nonpregnant women and 2 drinks per day for men. One drink equals 12 oz of beer, 5 oz of wine, or 1 oz of hard liquor.  Do not drink on an empty stomach.  Keep yourself hydrated with water, diet soda, or unsweetened iced tea.  Keep in mind that regular soda, juice, and other mixers may contain a lot of sugar and must be counted as carbs. What are tips for following this plan?  Reading food labels  Start by checking the serving size on the "Nutrition Facts" label of packaged foods and drinks. The amount of calories, carbs, fats, and other nutrients listed on the label is based on one serving of the item. Many items contain more than one serving per package.  Check the total grams (g) of carbs in one serving. You can calculate the number of servings of carbs in one serving by dividing the total carbs by 15. For example, if   a food has 30 g of total carbs, it would be equal to 2 servings of carbs.  Check the number of grams (g) of saturated and trans fats in one serving. Choose foods that have low or no amount of these fats.  Check the number of milligrams (mg) of salt (sodium) in one serving. Most people should limit total sodium intake to less than 2,300 mg per day.  Always check the nutrition information of foods labeled as "low-fat" or "nonfat". These foods may be higher in added sugar or refined carbs and should be avoided.  Talk to your dietitian to identify your daily goals for nutrients listed on the label. Shopping  Avoid buying canned, premade, or processed foods. These foods tend to be high in fat, sodium, and added sugar.  Shop around the outside edge of the grocery store. This includes fresh fruits and vegetables, bulk grains, fresh  meats, and fresh dairy. Cooking  Use low-heat cooking methods, such as baking, instead of high-heat cooking methods like deep frying.  Cook using healthy oils, such as olive, canola, or sunflower oil.  Avoid cooking with butter, cream, or high-fat meats. Meal planning  Eat meals and snacks regularly, preferably at the same times every day. Avoid going long periods of time without eating.  Eat foods high in fiber, such as fresh fruits, vegetables, beans, and whole grains. Talk to your dietitian about how many servings of carbs you can eat at each meal.  Eat 4-6 ounces (oz) of lean protein each day, such as lean meat, chicken, fish, eggs, or tofu. One oz of lean protein is equal to: ? 1 oz of meat, chicken, or fish. ? 1 egg. ?  cup of tofu.  Eat some foods each day that contain healthy fats, such as avocado, nuts, seeds, and fish. Lifestyle  Check your blood glucose regularly.  Exercise regularly as told by your health care provider. This may include: ? 150 minutes of moderate-intensity or vigorous-intensity exercise each week. This could be brisk walking, biking, or water aerobics. ? Stretching and doing strength exercises, such as yoga or weightlifting, at least 2 times a week.  Take medicines as told by your health care provider.  Do not use any products that contain nicotine or tobacco, such as cigarettes and e-cigarettes. If you need help quitting, ask your health care provider.  Work with a counselor or diabetes educator to identify strategies to manage stress and any emotional and social challenges. Questions to ask a health care provider  Do I need to meet with a diabetes educator?  Do I need to meet with a dietitian?  What number can I call if I have questions?  When are the best times to check my blood glucose? Where to find more information:  American Diabetes Association: diabetes.org  Academy of Nutrition and Dietetics: www.eatright.org  National Institute  of Diabetes and Digestive and Kidney Diseases (NIH): www.niddk.nih.gov Summary  A healthy meal plan will help you control your blood glucose and maintain a healthy lifestyle.  Working with a diet and nutrition specialist (dietitian) can help you make a meal plan that is best for you.  Keep in mind that carbohydrates (carbs) and alcohol have immediate effects on your blood glucose levels. It is important to count carbs and to use alcohol carefully. This information is not intended to replace advice given to you by your health care provider. Make sure you discuss any questions you have with your health care provider. Document Revised: 10/08/2017 Document   Reviewed: 11/30/2016 Elsevier Patient Education  2020 Elsevier Inc.  

## 2020-06-11 NOTE — Progress Notes (Signed)
Subjective:    Patient ID: Jacob Hanna, male    DOB: 02/01/1961, 59 y.o.   MRN: [id number]  HPI The patient comes in today for a wellness visit.  Patient is trying to lose weight Try to watch diet Try to be conscious regarding of what he eats Safety minded  A review of their health history was completed.  A review of medications was also completed.  Any needed refills; none  Eating habits: health conscious   Falls/  MVA accidents in past few months: none  Regular exercise: swimming and chores around the house  Specialist pt sees on regular basis: none  Preventative health issues were discussed.   Additional concerns: none   Review of Systems  Constitutional: Negative for diaphoresis and fatigue.  HENT: Negative for congestion and rhinorrhea.   Respiratory: Negative for cough and shortness of breath.   Cardiovascular: Negative for chest pain and leg swelling.  Gastrointestinal: Negative for abdominal pain and diarrhea.  Skin: Negative for color change and rash.  Neurological: Negative for dizziness and headaches.  Psychiatric/Behavioral: Negative for behavioral problems and confusion.       Objective:   Physical Exam Vitals reviewed.  Constitutional:      General: He is not in acute distress. HENT:     Head: Normocephalic and atraumatic.  Eyes:     General:        Right eye: No discharge.        Left eye: No discharge.  Neck:     Trachea: No tracheal deviation.  Cardiovascular:     Rate and Rhythm: Normal rate and regular rhythm.     Heart sounds: Normal heart sounds. No murmur heard.   Pulmonary:     Effort: Pulmonary effort is normal. No respiratory distress.     Breath sounds: Normal breath sounds.  Lymphadenopathy:     Cervical: No cervical adenopathy.  Skin:    General: Skin is warm and dry.  Neurological:     Mental Status: He is alert.     Coordination: Coordination normal.  Psychiatric:        Behavior: Behavior normal.   Patient  states his breathing overall doing well currently   Prostate exam normal Labs reviewed with patient Prediabetes very important to watch diet stay active lose weight Results for orders placed or performed in visit on 05/03/20  Basic metabolic panel  Result Value Ref Range   Glucose 99 65 - 99 mg/dL   BUN 12 6 - 24 mg/dL   Creatinine, Ser 0.53 0.76 - 1.27 mg/dL   GFR calc non Af Amer 78 >59 mL/min/1.73   GFR calc Af Amer 90 >59 mL/min/1.73   BUN/Creatinine Ratio 11 9 - 20   Sodium 141 134 - 144 mmol/L   Potassium 5.3 (H) 3.5 - 5.2 mmol/L   Chloride 103 96 - 106 mmol/L   CO2 25 20 - 29 mmol/L   Calcium 9.6 8.7 - 10.2 mg/dL  CBC with Differential/Platelet  Result Value Ref Range   WBC 4.5 3.4 - 10.8 x10E3/uL   RBC 4.69 4.14 - 5.80 x10E6/uL   Hemoglobin 15.3 13.0 - 17.7 g/dL   Hematocrit 97.6 73.4 - 51.0 %   MCV 94 79 - 97 fL   MCH 32.6 26.6 - 33.0 pg   MCHC 34.8 31 - 35 g/dL   RDW 19.3 79.0 - 24.0 %   Platelets 278 150 - 450 x10E3/uL   Neutrophils 45 Not Estab. %   Lymphs  39 Not Estab. %   Monocytes 11 Not Estab. %   Eos 4 Not Estab. %   Basos 1 Not Estab. %   Neutrophils Absolute 2.0 1 - 7 x10E3/uL   Lymphocytes Absolute 1.8 0 - 3 x10E3/uL   Monocytes Absolute 0.5 0 - 0 x10E3/uL   EOS (ABSOLUTE) 0.2 0.0 - 0.4 x10E3/uL   Basophils Absolute 0.0 0 - 0 x10E3/uL   Immature Granulocytes 0 Not Estab. %   Immature Grans (Abs) 0.0 0.0 - 0.1 x10E3/uL  Lipid panel  Result Value Ref Range   Cholesterol, Total 188 100 - 199 mg/dL   Triglycerides 485 0 - 149 mg/dL   HDL 39 (L) >46 mg/dL   VLDL Cholesterol Cal 20 5 - 40 mg/dL   LDL Chol Calc (NIH) 270 (H) 0 - 99 mg/dL   Chol/HDL Ratio 4.8 0.0 - 5.0 ratio  Hepatic function panel  Result Value Ref Range   Total Protein 7.1 6.0 - 8.5 g/dL   Albumin 4.5 3.8 - 4.9 g/dL   Bilirubin Total 0.6 0.0 - 1.2 mg/dL   Bilirubin, Direct 3.50 0.00 - 0.40 mg/dL   Alkaline Phosphatase 129 (H) 48 - 121 IU/L   AST 16 0 - 40 IU/L   ALT 11 0 - 44  IU/L  PSA  Result Value Ref Range   Prostate Specific Ag, Serum 0.5 0.0 - 4.0 ng/mL  Hemoglobin A1c  Result Value Ref Range   Hgb A1c MFr Bld 6.0 (H) 4.8 - 5.6 %   Est. average glucose Bld gHb Est-mCnc 126 mg/dL  Ferritin  Result Value Ref Range   Ferritin 98 30.0 - 400.0 ng/mL        Assessment & Plan:  Recent lab work does show hyperlipidemia not bad enough to be on a statin very important for the patient to stay physically active work hard at losing weight and recheck again by early spring Follow-up sooner if any type of respiratory illness Patient has had Covid vaccine  Adult wellness-complete.wellness physical was conducted today. Importance of diet and exercise were discussed in detail.  In addition to this a discussion regarding safety was also covered. We also reviewed over immunizations and gave recommendations regarding current immunization needed for age.  In addition to this additional areas were also touched on including: Preventative health exams needed:  Colonoscopy referral for colonoscopy is not needed at this point time next one is 2023  Patient was advised yearly wellness exam

## 2020-07-07 ENCOUNTER — Encounter: Payer: Self-pay | Admitting: Family Medicine

## 2020-07-07 DIAGNOSIS — K449 Diaphragmatic hernia without obstruction or gangrene: Secondary | ICD-10-CM

## 2020-07-08 NOTE — Addendum Note (Signed)
Addended by: Marlowe Shores on: 07/08/2020 03:25 PM   Modules accepted: Orders

## 2020-07-08 NOTE — Telephone Encounter (Signed)
I appreciate the reminder  Please move forward with referral to [facility] surgical in [place] Reason-large hiatal hernia  It could take a few weeks to get in with them but they do a very good job We will send them your information in their office should reach out to you within the next 10 days to schedule your appointment in the near future  If you do not hear from them please let us know

## 2020-08-23 ENCOUNTER — Other Ambulatory Visit: Payer: Self-pay

## 2020-08-23 ENCOUNTER — Other Ambulatory Visit: Payer: Managed Care, Other (non HMO)

## 2020-08-23 DIAGNOSIS — Z20822 Contact with and (suspected) exposure to covid-19: Secondary | ICD-10-CM

## 2020-08-24 LAB — SPECIMEN STATUS REPORT

## 2020-08-24 LAB — SARS-COV-2, NAA 2 DAY TAT

## 2020-08-24 LAB — NOVEL CORONAVIRUS, NAA: SARS-CoV-2, NAA: NOT DETECTED

## 2020-08-26 ENCOUNTER — Ambulatory Visit: Payer: Self-pay | Admitting: General Surgery

## 2020-08-26 NOTE — H&P (Signed)
History of Present Illness Axel Filler MD; 08/26/2020 2:35 PM) The patient is a 59 year old male who presents with a hiatal hernia. Referred by: Dr. 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 Patient states he's had issues with reflux since 2019. He states that he has had issues with dysphagia recently. He states it is becoming more and more painful. He states he is able to have some liquids that pass however with breads, meats he has more dysphasia. He states that he does sometimes have dysphagia with liquids.  He previously had a  CT scan in June 2019 which revealed approximately 3.5 cm hiatal hernia with 50% of the stomach herniated into the chest cavity.I did review this interpret this personally. Patient has had recent cystoscopy with Dr. Karilyn Cota in [place].    Patient had no previous abdominal surgery.   Past Surgical History (Chanel Lonni Fix, CMA; 08/26/2020 2:10 PM) Knee Surgery  Left. Oral Surgery  Vasectomy   Diagnostic Studies History (Chanel Lonni Fix, CMA; 08/26/2020 2:10 PM) Colonoscopy  5-10 years ago  Allergies (Chanel Lonni Fix, CMA; 08/26/2020 2:11 PM) No Known Drug Allergies  [08/26/2020]: Allergies Reconciled   Medication History (Chanel Lonni Fix, CMA; 08/26/2020 2:11 PM) Elwin Sleight (100-5MCG/ACT Aerosol, Inhalation) Active. Medications Reconciled  Social History (Chanel Lonni Fix, CMA; 08/26/2020 2:10 PM) Caffeine use  Carbonated beverages. No alcohol use  No drug use  Tobacco use  Never smoker.  Family History (Chanel Lonni Fix, CMA; 08/26/2020 2:10 PM) Heart Disease  Father. Heart disease in male family member before age 76  Respiratory Condition  Father.  Other Problems (Chanel Lonni Fix, CMA; 08/26/2020 2:10 PM) Asthma  Chronic Obstructive Lung Disease     Review of Systems Axel Filler MD; 08/26/2020 2:33 PM) General  Not Present- Appetite Loss, Chills, Fatigue, Fever, Night Sweats, Weight Gain and Weight Loss. Skin Not Present- Change in Wart/Mole, Dryness, Hives, Jaundice, New Lesions, Non-Healing Wounds, Rash and Ulcer. HEENT Present- Seasonal Allergies and Wears glasses/contact lenses. Not Present- Earache, Hearing Loss, Hoarseness, Nose Bleed, Oral Ulcers, Ringing in the Ears, Sinus Pain, Sore Throat, Visual Disturbances and Yellow Eyes. Respiratory Not Present- Bloody sputum, Chronic Cough, Difficulty Breathing, Snoring and Wheezing. Breast Not Present- Breast Mass, Breast Pain, Nipple Discharge and Skin Changes. Cardiovascular Not Present- Chest Pain, Difficulty Breathing Lying Down, Leg Cramps, Palpitations, Rapid Heart Rate, Shortness of Breath and Swelling of Extremities. Gastrointestinal Not Present- Abdominal Pain, Bloating, Bloody Stool, Change in Bowel Habits, Chronic diarrhea, Constipation, Difficulty Swallowing, Excessive gas, Gets full quickly at meals, Hemorrhoids, Indigestion, Nausea, Rectal Pain and Vomiting. Male Genitourinary Not Present- Blood in Urine, Change in Urinary Stream, Frequency, Impotence, Nocturia, Painful Urination, Urgency and Urine Leakage. Musculoskeletal Not Present- Back Pain, Joint Pain, Joint Stiffness, Muscle Pain, Muscle Weakness and Swelling of Extremities. Neurological Not Present- Decreased Memory, Fainting, Headaches, Numbness, Seizures, Tingling, Tremor, Trouble walking and Weakness. Psychiatric Not Present- Anxiety, Bipolar, Change in Sleep Pattern, Depression, Fearful and Frequent crying. Endocrine Not Present- Cold Intolerance, Excessive Hunger, Hair Changes, Heat Intolerance, Hot flashes and New Diabetes. Hematology Not Present- Blood Thinners, Easy Bruising, Excessive bleeding, Gland problems, HIV and Persistent Infections. All other systems negative  Vitals (Chanel Nolan CMA; 08/26/2020 2:11 PM) 08/26/2020 2:11 PM Weight: 223.25 lb Height: 69in Body  Surface Area: 2.16 m Body Mass Index: 32.97 kg/m  Temp.: 97.42F  Pulse: 89 (Regular)  BP: 132/74(Sitting, Left Arm, Standard)  Physical Exam Axel Filler MD; 08/26/2020 2:35 PM) The physical exam findings are as follows: Note: Constitutional: No acute distress, conversant, appears stated age  Eyes: Anicteric sclerae, moist conjunctiva, no lid lag  Neck: No thyromegaly, trachea midline, no cervical lymphadenopathy  Lungs: Clear to auscultation biilaterally, normal respiratory effot  Cardiovascular: regular rate & rhythm, no murmurs, no peripheal edema, pedal pulses 2+  GI: Soft, no masses or hepatosplenomegaly, non-tender to palpation  MSK: Normal gait, no clubbing cyanosis, edema  Skin: No rashes, palpation reveals normal skin turgor  Psychiatric: Appropriate judgment and insight, oriented to person, place, and time    Assessment & Plan Axel Filler MD; 08/26/2020 2:37 PM) HIATAL HERNIA WITH GERD (K21.9) Impression: Patient is a 59 year old male, who comes in with a large hiatal hernia. 1. We'll have patient evaluated by Dr. Karilyn Cota with an endoscopy to directly for any further pathology 2. Patient will need a manometry study. We'll have him referred for this study 3. We'll have the patient undergo a robotic hiatal hernia repair and fundoplication. 4 Discussed with patient the risks and benefits of the procedure to include but not limited to: Infection, bleeding, damage to structures, possible pneumothorax, possible recurrence. The patient voiced understanding and wishes to proceed.  I reviewed the patient's external notes from the referring physicians as well as consulting physician team. Each of the radiologic studies and lab studies were independently reviewed and interpreted. I discussed the results of the above studies and how they relate to the patient's surgical problems.

## 2020-08-29 ENCOUNTER — Other Ambulatory Visit: Payer: Self-pay

## 2020-08-29 ENCOUNTER — Telehealth: Payer: Self-pay | Admitting: Gastroenterology

## 2020-08-29 DIAGNOSIS — K219 Gastro-esophageal reflux disease without esophagitis: Secondary | ICD-10-CM

## 2020-08-29 DIAGNOSIS — K449 Diaphragmatic hernia without obstruction or gangrene: Secondary | ICD-10-CM

## 2020-08-29 DIAGNOSIS — Z1159 Encounter for screening for other viral diseases: Secondary | ICD-10-CM

## 2020-08-29 NOTE — Telephone Encounter (Signed)
Hi Jacob Hanna, we have received an urgent referral from [facility] for a manometry prior to upcoming hiatal hernia repair. I will sent records to you for review. Thank you.

## 2020-08-29 NOTE — Telephone Encounter (Addendum)
Referral for esophageal manometry received. Provider Dr Axel Filler Practice name [facility] [phone] Fax [phone] Patient contacted and instructed for procedure. Procedure date 10/04/20 at 12:30pm. Arrive at 12:00 pm.

## 2020-09-03 ENCOUNTER — Encounter (INDEPENDENT_AMBULATORY_CARE_PROVIDER_SITE_OTHER): Payer: Self-pay | Admitting: *Deleted

## 2020-10-01 ENCOUNTER — Other Ambulatory Visit (HOSPITAL_COMMUNITY)
Admission: RE | Admit: 2020-10-01 | Discharge: 2020-10-01 | Disposition: A | Discharge status: Home / Self Care | Payer: Managed Care, Other (non HMO) | Source: Ambulatory Visit | Attending: Gastroenterology | Admitting: Gastroenterology

## 2020-10-01 DIAGNOSIS — Z01812 Encounter for preprocedural laboratory examination: Secondary | ICD-10-CM | POA: Diagnosis not present

## 2020-10-01 DIAGNOSIS — Z20822 Contact with and (suspected) exposure to covid-19: Secondary | ICD-10-CM | POA: Diagnosis not present

## 2020-10-01 LAB — SARS CORONAVIRUS 2 (TAT 6-24 HRS): SARS Coronavirus 2: NEGATIVE

## 2020-10-04 ENCOUNTER — Ambulatory Visit (HOSPITAL_COMMUNITY)
Admission: RE | Admit: 2020-10-04 | Discharge: 2020-10-04 | Disposition: A | Discharge status: Home / Self Care | Payer: Managed Care, Other (non HMO) | Attending: Gastroenterology | Admitting: Gastroenterology

## 2020-10-04 ENCOUNTER — Encounter (HOSPITAL_COMMUNITY)
Admission: RE | Disposition: A | Discharge status: Home / Self Care | Payer: Self-pay | Source: Home / Self Care | Attending: Gastroenterology

## 2020-10-04 DIAGNOSIS — K449 Diaphragmatic hernia without obstruction or gangrene: Secondary | ICD-10-CM | POA: Insufficient documentation

## 2020-10-04 DIAGNOSIS — R079 Chest pain, unspecified: Secondary | ICD-10-CM | POA: Insufficient documentation

## 2020-10-04 DIAGNOSIS — Z01818 Encounter for other preprocedural examination: Secondary | ICD-10-CM

## 2020-10-04 DIAGNOSIS — K219 Gastro-esophageal reflux disease without esophagitis: Secondary | ICD-10-CM

## 2020-10-04 DIAGNOSIS — R110 Nausea: Secondary | ICD-10-CM | POA: Diagnosis not present

## 2020-10-04 HISTORY — PX: ESOPHAGEAL MANOMETRY: SHX5429

## 2020-10-04 SURGERY — MANOMETRY, ESOPHAGUS
Anesthesia: Choice

## 2020-10-04 MED ORDER — LIDOCAINE VISCOUS HCL 2 % MT SOLN
OROMUCOSAL | Status: AC
  Filled 2020-10-04: qty 15

## 2020-10-04 SURGICAL SUPPLY — 2 items
FACESHIELD LNG OPTICON STERILE (SAFETY) IMPLANT
GLOVE BIO SURGEON STRL SZ8 (GLOVE) ×4 IMPLANT

## 2020-10-04 NOTE — Progress Notes (Signed)
Esophageal manometry performed per protocol.  Patient tolerated well.  Report to be sent to Dr. Kavitha Nandigam 

## 2020-10-07 ENCOUNTER — Encounter (INDEPENDENT_AMBULATORY_CARE_PROVIDER_SITE_OTHER): Payer: Self-pay | Admitting: Gastroenterology

## 2020-10-07 ENCOUNTER — Encounter (INDEPENDENT_AMBULATORY_CARE_PROVIDER_SITE_OTHER): Payer: Self-pay

## 2020-10-07 ENCOUNTER — Other Ambulatory Visit (INDEPENDENT_AMBULATORY_CARE_PROVIDER_SITE_OTHER): Payer: Self-pay

## 2020-10-07 ENCOUNTER — Other Ambulatory Visit: Payer: Self-pay

## 2020-10-07 ENCOUNTER — Ambulatory Visit (INDEPENDENT_AMBULATORY_CARE_PROVIDER_SITE_OTHER): Payer: Managed Care, Other (non HMO) | Admitting: Gastroenterology

## 2020-10-07 DIAGNOSIS — R1319 Other dysphagia: Secondary | ICD-10-CM

## 2020-10-07 DIAGNOSIS — K449 Diaphragmatic hernia without obstruction or gangrene: Secondary | ICD-10-CM | POA: Diagnosis not present

## 2020-10-07 DIAGNOSIS — R1310 Dysphagia, unspecified: Secondary | ICD-10-CM | POA: Insufficient documentation

## 2020-10-07 NOTE — Patient Instructions (Signed)
Schedule EGD

## 2020-10-07 NOTE — Progress Notes (Signed)
Katrinka Blazing, M.D. Gastroenterology & Hepatology [facility] [address] [place], [place] [address] Primary Care Physician: Babs Sciara, MD [address] [place] [address]  Referring MD: Axel Filler, MD  I will communicate my assessment and recommendations to the referring MD via EMR. Note: Occasional unusual wording and randomly placed punctuation marks may result from the use of speech recognition technology to transcribe this document"  Chief Complaint:  Evaluation of hiatal hernia  History of Present Illness: Jacob Hanna is a 59 y.o. 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  Patient reports since 2018 he has been presenting worsening recurrent episodes of dysphagia. Patient reports that he mostly has dysphagia to solids, very occasionally it happens to liquids. Patient reports that when the food "does not go down" he presents retrosternal chest pain that radiates to the epigastric area as food stays in the middle of his chest, which usually lasts 2-4 hours. He has had vomiting episodes that he induced due to persistent retrosternal pain -this maneuver provided relief of his symptoms. Patient has lost close to 15 lb due to his dysphagia. He has been been avoiding food that can lead to more episodes of dysphagia, he believes this is the main reason he has had weight loss, but he would like to keep losing weight.  Patient denies having any heartburn in the past and is not taking any medication for acid reduction. The patient denies having any nausea, fever, chills, hematochezia, melena, hematemesis, abdominal distention, abdominal pain, diarrhea, jaundice, pruritus.  Due to the symptoms, the patient was seen by Dr. Derrell Lolling at [facility] surgery who referred him to our clinic.  Dr. Derrell Lolling will like to pursue robotic hiatal hernia repair and fundoplication as the  patient had a CT scan performed in June 2019 showing a 3.5 cm hiatal hernia with 50% of the stomach herniated into the chest cavity.  Notably, patient had an esophageal manometry last Friday at [facility] as part of the evaluation of his dysphagia, he states he was told by the nurse the results were normal but no report is available yet.   Patient recently started taking ibuprofen due to an ankle sprain, has only been taking it since Wednesday but not previously. He is currently taking iron once a day.  Last EGD: 2013 - Mild changes of reflux esophagitis limited to GE junction. Moderate size hiatal hernia with erosions involving herniated part of the stomach and at the level of hiatus. Incidental finding of 5 mm submucosal nodule at antrum possibly a small lipoma. Bulbar duodenitis. Last Colonoscopy: 2013 - Normal colonoscopy except small external hemorrhoids. Capsule endoscopy: Two small punctate telangiectasia noted at jejunum without stigmata of bleed. Focal mucosal abnormality at distal ileum possibly healing ulcer related to NSAID use.  FHx: neg for any gastrointestinal/liver disease, no malignancies Social: neg smoking, alcohol or illicit drug use Surgical: non contributory  Past Medical History: Past Medical History:  Diagnosis Date  . Allergy to environmental factors   . COPD (chronic obstructive pulmonary disease) (HCC)   . Exercise-induced asthma   . IDA (iron deficiency anemia)   . Mild obstructive sleep apnea    per study 03-27-2013 (in epic)  no cpap recommended  . OA (osteoarthritis)    left knee    Past Surgical History: Past Surgical History:  Procedure Laterality Date  . CARDIOVASCULAR STRESS TEST  07/26/2017   Low risk nuclear study  w/ no ischemia/  normal LV function and wall motion, nuclear stress ef 60%  . CHONDROPLASTY Left 04/14/2013   Procedure: CHONDROPLASTY;  Surgeon: Vickki Hearing, MD;  Location: [facility];  Service: Orthopedics;  Laterality: Left;  .  COLONOSCOPY    . ESOPHAGOGASTRODUODENOSCOPY    . GIVENS CAPSULE STUDY  09/07/2012   Procedure: GIVENS CAPSULE STUDY;  Surgeon: Malissa Hippo, MD;  Location: [facility];  Service: Endoscopy;  Laterality: N/A;  730  . GIVENS CAPSULE STUDY N/A 06/23/2016   Procedure: GIVENS CAPSULE STUDY;  Surgeon: Malissa Hippo, MD;  Location: [facility];  Service: Endoscopy;  Laterality: N/A;  730  . KNEE ARTHROSCOPY Left 06/ 2019  @[facility]   . KNEE ARTHROSCOPY WITH EXCISION PLICA Left 01/31/2013   Procedure: KNEE ARTHROSCOPY WITH EXCISION PLICA;  Surgeon: 02/02/2013, MD;  Location: [facility];  Service: Orthopedics;  Laterality: Left;  . KNEE ARTHROSCOPY WITH MEDIAL MENISECTOMY Left 01/31/2013   Procedure: KNEE ARTHROSCOPY WITH PARTIAL  MEDIAL MENISECTOMY;  Surgeon: 02/02/2013, MD;  Location: [facility];  Service: Orthopedics;  Laterality: Left;  . KNEE ARTHROSCOPY WITH MEDIAL MENISECTOMY Left 04/14/2013   Procedure: KNEE ARTHROSCOPY WITH PARTIAL MEDIAL MENISECTOMY;  Surgeon: 06/14/2013, MD;  Location: [facility];  Service: Orthopedics;  Laterality: Left;  Partial medial menisectomy  . TOTAL KNEE ARTHROPLASTY Left 09/12/2018   Procedure: LEFT TOTAL KNEE ARTHROPLASTY;  Surgeon: 13/02/2018, MD;  Location: [facility];  Service: Orthopedics;  Laterality: Left;  Adductor Block    Family History: Family History  Problem Relation Age of Onset  . Heart attack Father        Pacemaker  . Diabetes Sister   . Diabetes Sister     Social History: Social History   Tobacco Use  Smoking Status Never Smoker  Smokeless Tobacco Never Used   Social History   Substance and Sexual Activity  Alcohol Use No   Social History   Substance and Sexual Activity  Drug Use No    Allergies: No Known Allergies  Medications: Current Outpatient Medications  Medication Sig Dispense Refill  . albuterol (VENTOLIN HFA) 108 (90 Base) MCG/ACT inhaler Inhale 2 puffs into the lungs every 4 (four) hours as needed for  wheezing or shortness of breath. 18 g 4  . cetirizine (ZYRTEC) 10 MG tablet Take 10 mg by mouth daily.    . Cinnamon 500 MG capsule Take 1,000 mg by mouth 2 (two) times daily.    . cyanocobalamin 1000 MCG tablet Take 1,000 mcg by mouth 2 (two) times daily.     . ferrous sulfate 325 (65 FE) MG tablet Take 325 mg by mouth 2 (two) times daily with a meal.    . ibuprofen (ADVIL) 200 MG tablet Take 400 mg by mouth every 6 (six) hours as needed.    . mometasone-formoterol (DULERA) 100-5 MCG/ACT AERO Inhale 2 puffs BID. Rinse after use 13 g 6  . Multiple Vitamin (MULTIVITAMIN) capsule Take 1 capsule by mouth daily.    . pseudoephedrine (SUDAFED) 30 MG tablet Take 30 mg by mouth every 4 (four) hours as needed for congestion.    Gean Birchwood guaiFENesin (MUCINEX) 600 MG 12 hr tablet Take by mouth 2 (two) times daily as needed. (Patient not taking: Reported on 10/07/2020)    . mupirocin ointment (BACTROBAN) 2 % Apply 1 application topically 2 (two) times daily. (Patient not taking: Reported on 10/07/2020) 22 g 0   No current facility-administered medications for this visit.  Review of Systems: GENERAL: negative for malaise, night sweats HEENT: No changes in hearing or vision, no nose bleeds or other nasal problems. NECK: Negative for lumps, goiter, pain and significant neck swelling RESPIRATORY: Negative for cough, wheezing CARDIOVASCULAR: Negative for chest pain, leg swelling, palpitations, orthopnea GI: SEE HPI MUSCULOSKELETAL: Negative for joint pain or swelling, back pain, and muscle pain. SKIN: Negative for lesions, rash PSYCH: Negative for sleep disturbance, mood disorder and recent psychosocial stressors. HEMATOLOGY Negative for prolonged bleeding, bruising easily, and swollen nodes. ENDOCRINE: Negative for cold or heat intolerance, polyuria, polydipsia and goiter. NEURO: negative for tremor, gait imbalance, syncope and seizures. The remainder of the review of systems is  noncontributory.   Physical Exam: BP (!) 147/85 (BP Location: Left Arm, Patient Position: Sitting, Cuff Size: Large)   Pulse 65   Temp 98.2 F (36.8 C) (Oral)   Ht 5' 9.4" (1.763 m)   Wt 220 lb (99.8 kg)   BMI 32.11 kg/m  GENERAL: The patient is AO x3, in no acute distress. HEENT: Head is normocephalic and atraumatic. EOMI are intact. Mouth is well hydrated and without lesions. NECK: Supple. No masses LUNGS: Clear to auscultation. No presence of rhonchi/wheezing/rales. Adequate chest expansion HEART: RRR, normal s1 and s2. ABDOMEN: Soft, nontender, no guarding, no peritoneal signs, and nondistended. BS +. No masses. EXTREMITIES: Without any cyanosis, clubbing, rash, lesions or edema. NEUROLOGIC: AOx3, no focal motor deficit. SKIN: no jaundice, no rashes   Imaging/Labs: as above  I personally reviewed and interpreted the available labs, imaging and endoscopic files.  Impression and Plan: Jacob Hanna is a 59 y.o. 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  Patient has a large paraesophageal hernia which is scheduled to undergo surgical correction in January 2022.  I agree that evaluation for any other organic pathology should be pursued with an EGD which will be scheduled.  He is not presenting any classic symptoms for GERD so I will not prescribe any acid reducing medication at this moment.  The patient understood and agreed.  - Schedule EGD  All questions were answered.      Katrinka Blazing, MD Gastroenterology and Hepatology [facility]

## 2020-10-07 NOTE — H&P (View-Only) (Signed)
Katrinka Blazing, M.D. Gastroenterology & Hepatology [facility] [address] [place], [place] [address] Primary Care Physician: Babs Sciara, MD [address] [place] [address]  Referring MD: Axel Filler, MD  I will communicate my assessment and recommendations to the referring MD via EMR. Note: Occasional unusual wording and randomly placed punctuation marks may result from the use of speech recognition technology to transcribe this document"  Chief Complaint:  Evaluation of hiatal hernia  History of Present Illness: Jacob Hanna is a 59 y.o. 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  Patient reports since 2018 he has been presenting worsening recurrent episodes of dysphagia. Patient reports that he mostly has dysphagia to solids, very occasionally it happens to liquids. Patient reports that when the food "does not go down" he presents retrosternal chest pain that radiates to the epigastric area as food stays in the middle of his chest, which usually lasts 2-4 hours. He has had vomiting episodes that he induced due to persistent retrosternal pain -this maneuver provided relief of his symptoms. Patient has lost close to 15 lb due to his dysphagia. He has been been avoiding food that can lead to more episodes of dysphagia, he believes this is the main reason he has had weight loss, but he would like to keep losing weight.  Patient denies having any heartburn in the past and is not taking any medication for acid reduction. The patient denies having any nausea, fever, chills, hematochezia, melena, hematemesis, abdominal distention, abdominal pain, diarrhea, jaundice, pruritus.  Due to the symptoms, the patient was seen by Dr. Derrell Lolling at [facility] surgery who referred him to our clinic.  Dr. Derrell Lolling will like to pursue robotic hiatal hernia repair and fundoplication as the  patient had a CT scan performed in June 2019 showing a 3.5 cm hiatal hernia with 50% of the stomach herniated into the chest cavity.  Notably, patient had an esophageal manometry last Friday at [facility] as part of the evaluation of his dysphagia, he states he was told by the nurse the results were normal but no report is available yet.   Patient recently started taking ibuprofen due to an ankle sprain, has only been taking it since Wednesday but not previously. He is currently taking iron once a day.  Last EGD: 2013 - Mild changes of reflux esophagitis limited to GE junction. Moderate size hiatal hernia with erosions involving herniated part of the stomach and at the level of hiatus. Incidental finding of 5 mm submucosal nodule at antrum possibly a small lipoma. Bulbar duodenitis. Last Colonoscopy: 2013 - Normal colonoscopy except small external hemorrhoids. Capsule endoscopy: Two small punctate telangiectasia noted at jejunum without stigmata of bleed. Focal mucosal abnormality at distal ileum possibly healing ulcer related to NSAID use.  FHx: neg for any gastrointestinal/liver disease, no malignancies Social: neg smoking, alcohol or illicit drug use Surgical: non contributory  Past Medical History: Past Medical History:  Diagnosis Date  . Allergy to environmental factors   . COPD (chronic obstructive pulmonary disease) (HCC)   . Exercise-induced asthma   . IDA (iron deficiency anemia)   . Mild obstructive sleep apnea    per study 03-27-2013 (in epic)  no cpap recommended  . OA (osteoarthritis)    left knee    Past Surgical History: Past Surgical History:  Procedure Laterality Date  . CARDIOVASCULAR STRESS TEST  07/26/2017   Low risk nuclear study  w/ no ischemia/  normal LV function and wall motion, nuclear stress ef 60%  . CHONDROPLASTY Left 04/14/2013   Procedure: CHONDROPLASTY;  Surgeon: Vickki Hearing, MD;  Location: [facility];  Service: Orthopedics;  Laterality: Left;  .  COLONOSCOPY    . ESOPHAGOGASTRODUODENOSCOPY    . GIVENS CAPSULE STUDY  09/07/2012   Procedure: GIVENS CAPSULE STUDY;  Surgeon: Malissa Hippo, MD;  Location: [facility];  Service: Endoscopy;  Laterality: N/A;  730  . GIVENS CAPSULE STUDY N/A 06/23/2016   Procedure: GIVENS CAPSULE STUDY;  Surgeon: Malissa Hippo, MD;  Location: [facility];  Service: Endoscopy;  Laterality: N/A;  730  . KNEE ARTHROSCOPY Left 06/ 2019  @[facility]   . KNEE ARTHROSCOPY WITH EXCISION PLICA Left 01/31/2013   Procedure: KNEE ARTHROSCOPY WITH EXCISION PLICA;  Surgeon: 02/02/2013, MD;  Location: [facility];  Service: Orthopedics;  Laterality: Left;  . KNEE ARTHROSCOPY WITH MEDIAL MENISECTOMY Left 01/31/2013   Procedure: KNEE ARTHROSCOPY WITH PARTIAL  MEDIAL MENISECTOMY;  Surgeon: 02/02/2013, MD;  Location: [facility];  Service: Orthopedics;  Laterality: Left;  . KNEE ARTHROSCOPY WITH MEDIAL MENISECTOMY Left 04/14/2013   Procedure: KNEE ARTHROSCOPY WITH PARTIAL MEDIAL MENISECTOMY;  Surgeon: 06/14/2013, MD;  Location: [facility];  Service: Orthopedics;  Laterality: Left;  Partial medial menisectomy  . TOTAL KNEE ARTHROPLASTY Left 09/12/2018   Procedure: LEFT TOTAL KNEE ARTHROPLASTY;  Surgeon: 13/02/2018, MD;  Location: [facility];  Service: Orthopedics;  Laterality: Left;  Adductor Block    Family History: Family History  Problem Relation Age of Onset  . Heart attack Father        Pacemaker  . Diabetes Sister   . Diabetes Sister     Social History: Social History   Tobacco Use  Smoking Status Never Smoker  Smokeless Tobacco Never Used   Social History   Substance and Sexual Activity  Alcohol Use No   Social History   Substance and Sexual Activity  Drug Use No    Allergies: No Known Allergies  Medications: Current Outpatient Medications  Medication Sig Dispense Refill  . albuterol (VENTOLIN HFA) 108 (90 Base) MCG/ACT inhaler Inhale 2 puffs into the lungs every 4 (four) hours as needed for  wheezing or shortness of breath. 18 g 4  . cetirizine (ZYRTEC) 10 MG tablet Take 10 mg by mouth daily.    . Cinnamon 500 MG capsule Take 1,000 mg by mouth 2 (two) times daily.    . cyanocobalamin 1000 MCG tablet Take 1,000 mcg by mouth 2 (two) times daily.     . ferrous sulfate 325 (65 FE) MG tablet Take 325 mg by mouth 2 (two) times daily with a meal.    . ibuprofen (ADVIL) 200 MG tablet Take 400 mg by mouth every 6 (six) hours as needed.    . mometasone-formoterol (DULERA) 100-5 MCG/ACT AERO Inhale 2 puffs BID. Rinse after use 13 g 6  . Multiple Vitamin (MULTIVITAMIN) capsule Take 1 capsule by mouth daily.    . pseudoephedrine (SUDAFED) 30 MG tablet Take 30 mg by mouth every 4 (four) hours as needed for congestion.    Gean Birchwood guaiFENesin (MUCINEX) 600 MG 12 hr tablet Take by mouth 2 (two) times daily as needed. (Patient not taking: Reported on 10/07/2020)    . mupirocin ointment (BACTROBAN) 2 % Apply 1 application topically 2 (two) times daily. (Patient not taking: Reported on 10/07/2020) 22 g 0   No current facility-administered medications for this visit.  Review of Systems: GENERAL: negative for malaise, night sweats HEENT: No changes in hearing or vision, no nose bleeds or other nasal problems. NECK: Negative for lumps, goiter, pain and significant neck swelling RESPIRATORY: Negative for cough, wheezing CARDIOVASCULAR: Negative for chest pain, leg swelling, palpitations, orthopnea GI: SEE HPI MUSCULOSKELETAL: Negative for joint pain or swelling, back pain, and muscle pain. SKIN: Negative for lesions, rash PSYCH: Negative for sleep disturbance, mood disorder and recent psychosocial stressors. HEMATOLOGY Negative for prolonged bleeding, bruising easily, and swollen nodes. ENDOCRINE: Negative for cold or heat intolerance, polyuria, polydipsia and goiter. NEURO: negative for tremor, gait imbalance, syncope and seizures. The remainder of the review of systems is  noncontributory.   Physical Exam: BP (!) 147/85 (BP Location: Left Arm, Patient Position: Sitting, Cuff Size: Large)   Pulse 65   Temp 98.2 F (36.8 C) (Oral)   Ht 5' 9.4" (1.763 m)   Wt 220 lb (99.8 kg)   BMI 32.11 kg/m  GENERAL: The patient is AO x3, in no acute distress. HEENT: Head is normocephalic and atraumatic. EOMI are intact. Mouth is well hydrated and without lesions. NECK: Supple. No masses LUNGS: Clear to auscultation. No presence of rhonchi/wheezing/rales. Adequate chest expansion HEART: RRR, normal s1 and s2. ABDOMEN: Soft, nontender, no guarding, no peritoneal signs, and nondistended. BS +. No masses. EXTREMITIES: Without any cyanosis, clubbing, rash, lesions or edema. NEUROLOGIC: AOx3, no focal motor deficit. SKIN: no jaundice, no rashes   Imaging/Labs: as above  I personally reviewed and interpreted the available labs, imaging and endoscopic files.  Impression and Plan: Jacob Hanna is a 59 y.o. 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  Patient has a large paraesophageal hernia which is scheduled to undergo surgical correction in January 2022.  I agree that evaluation for any other organic pathology should be pursued with an EGD which will be scheduled.  He is not presenting any classic symptoms for GERD so I will not prescribe any acid reducing medication at this moment.  The patient understood and agreed.  - Schedule EGD  All questions were answered.      Katrinka Blazing, MD Gastroenterology and Hepatology [facility]

## 2020-10-15 ENCOUNTER — Other Ambulatory Visit (HOSPITAL_COMMUNITY)
Admission: RE | Admit: 2020-10-15 | Discharge: 2020-10-15 | Disposition: A | Discharge status: Home / Self Care | Payer: Managed Care, Other (non HMO) | Source: Ambulatory Visit | Attending: Gastroenterology | Admitting: Gastroenterology

## 2020-10-15 ENCOUNTER — Other Ambulatory Visit: Payer: Self-pay

## 2020-10-15 DIAGNOSIS — Z20822 Contact with and (suspected) exposure to covid-19: Secondary | ICD-10-CM | POA: Insufficient documentation

## 2020-10-15 DIAGNOSIS — Z01812 Encounter for preprocedural laboratory examination: Secondary | ICD-10-CM | POA: Insufficient documentation

## 2020-10-15 LAB — SARS CORONAVIRUS 2 (TAT 6-24 HRS): SARS Coronavirus 2: NEGATIVE

## 2020-10-16 ENCOUNTER — Other Ambulatory Visit: Payer: Self-pay

## 2020-10-16 ENCOUNTER — Ambulatory Visit (HOSPITAL_COMMUNITY): Payer: Managed Care, Other (non HMO) | Admitting: Anesthesiology

## 2020-10-16 ENCOUNTER — Encounter (HOSPITAL_COMMUNITY)
Admission: RE | Disposition: A | Discharge status: Home / Self Care | Payer: Self-pay | Source: Home / Self Care | Attending: Gastroenterology

## 2020-10-16 ENCOUNTER — Ambulatory Visit (HOSPITAL_COMMUNITY)
Admission: RE | Admit: 2020-10-16 | Discharge: 2020-10-16 | Disposition: A | Discharge status: Home / Self Care | Payer: Managed Care, Other (non HMO) | Attending: Gastroenterology | Admitting: Gastroenterology

## 2020-10-16 ENCOUNTER — Encounter (HOSPITAL_COMMUNITY): Payer: Self-pay | Admitting: Gastroenterology

## 2020-10-16 DIAGNOSIS — R1313 Dysphagia, pharyngeal phase: Secondary | ICD-10-CM | POA: Diagnosis present

## 2020-10-16 DIAGNOSIS — K259 Gastric ulcer, unspecified as acute or chronic, without hemorrhage or perforation: Secondary | ICD-10-CM | POA: Diagnosis not present

## 2020-10-16 DIAGNOSIS — M1991 Primary osteoarthritis, unspecified site: Secondary | ICD-10-CM | POA: Insufficient documentation

## 2020-10-16 DIAGNOSIS — Z7951 Long term (current) use of inhaled steroids: Secondary | ICD-10-CM | POA: Diagnosis not present

## 2020-10-16 DIAGNOSIS — K3189 Other diseases of stomach and duodenum: Secondary | ICD-10-CM

## 2020-10-16 DIAGNOSIS — K449 Diaphragmatic hernia without obstruction or gangrene: Secondary | ICD-10-CM | POA: Diagnosis not present

## 2020-10-16 DIAGNOSIS — J449 Chronic obstructive pulmonary disease, unspecified: Secondary | ICD-10-CM | POA: Insufficient documentation

## 2020-10-16 DIAGNOSIS — K2980 Duodenitis without bleeding: Secondary | ICD-10-CM | POA: Insufficient documentation

## 2020-10-16 DIAGNOSIS — Z96652 Presence of left artificial knee joint: Secondary | ICD-10-CM | POA: Diagnosis not present

## 2020-10-16 DIAGNOSIS — Z79899 Other long term (current) drug therapy: Secondary | ICD-10-CM | POA: Insufficient documentation

## 2020-10-16 HISTORY — PX: BIOPSY: SHX5522

## 2020-10-16 HISTORY — PX: ESOPHAGOGASTRODUODENOSCOPY (EGD) WITH PROPOFOL: SHX5813

## 2020-10-16 SURGERY — ESOPHAGOGASTRODUODENOSCOPY (EGD) WITH PROPOFOL
Anesthesia: General

## 2020-10-16 MED ORDER — LIDOCAINE HCL (CARDIAC) PF 100 MG/5ML IV SOSY
PREFILLED_SYRINGE | INTRAVENOUS | Status: DC | PRN
  Administered 2020-10-16: 80 mg via INTRAVENOUS

## 2020-10-16 MED ORDER — LIDOCAINE VISCOUS HCL 2 % MT SOLN: 15.0000 mL | Freq: Once | OROMUCOSAL | Status: AC

## 2020-10-16 MED ORDER — LIDOCAINE VISCOUS HCL 2 % MT SOLN
OROMUCOSAL | Status: AC
  Administered 2020-10-16: 15 mL via OROMUCOSAL
  Filled 2020-10-16: qty 15

## 2020-10-16 MED ORDER — LACTATED RINGERS IV SOLN: INTRAVENOUS | Status: DC | PRN

## 2020-10-16 MED ORDER — GLYCOPYRROLATE 0.2 MG/ML IJ SOLN
INTRAMUSCULAR | Status: AC
  Administered 2020-10-16: 0.2 mg via INTRAVENOUS
  Filled 2020-10-16: qty 1

## 2020-10-16 MED ORDER — PROPOFOL 10 MG/ML IV BOLUS
INTRAVENOUS | Status: DC | PRN
  Administered 2020-10-16: 40 mg via INTRAVENOUS
  Administered 2020-10-16: 30 mg via INTRAVENOUS
  Administered 2020-10-16: 50 mg via INTRAVENOUS
  Administered 2020-10-16: 100 mg via INTRAVENOUS
  Administered 2020-10-16: 50 mg via INTRAVENOUS
  Administered 2020-10-16: 30 mg via INTRAVENOUS

## 2020-10-16 MED ORDER — GLYCOPYRROLATE 0.2 MG/ML IJ SOLN: 0.2000 mg | Freq: Once | INTRAMUSCULAR | Status: AC

## 2020-10-16 MED ORDER — LACTATED RINGERS IV SOLN: Freq: Once | INTRAVENOUS | Status: AC

## 2020-10-16 NOTE — Discharge Instructions (Signed)
You are being discharged to home.  Resume your previous diet.  We are waiting for your pathology results.  Return to your referring physician as previously scheduled.    Upper Endoscopy, Adult, Care After This sheet gives you information about how to care for yourself after your procedure. Your health care provider may also give you more specific instructions. If you have problems or questions, contact your health care provider. What can I expect after the procedure? After the procedure, it is common to have:  A sore throat.  Mild stomach pain or discomfort.  Bloating.  Nausea. Follow these instructions at home:   Follow instructions from your health care provider about what to eat or drink after your procedure.  Return to your normal activities as told by your health care provider. Ask your health care provider what activities are safe for you.  Take over-the-counter and prescription medicines only as told by your health care provider.  Do not drive for 24 hours if you were given a sedative during your procedure.  Keep all follow-up visits as told by your health care provider. This is important. Contact a health care provider if you have:  A sore throat that lasts longer than one day.  Trouble swallowing. Get help right away if:  You vomit blood or your vomit looks like coffee grounds.  You have: ? A fever. ? Bloody, black, or tarry stools. ? A severe sore throat or you cannot swallow. ? Difficulty breathing. ? Severe pain in your chest or abdomen. Summary  After the procedure, it is common to have a sore throat, mild stomach discomfort, bloating, and nausea.  Do not drive for 24 hours if you were given a sedative during the procedure.  Follow instructions from your health care provider about what to eat or drink after your procedure.  Return to your normal activities as told by your health care provider. This information is not intended to replace advice given  to you by your health care provider. Make sure you discuss any questions you have with your health care provider. Document Revised: 04/19/2018 Document Reviewed: 03/28/2018 Elsevier Patient Education  2020 Elsevier Inc.   Hiatal Hernia  A hiatal hernia occurs when part of the stomach slides above the muscle that separates the abdomen from the chest (diaphragm). A person can be born with a hiatal hernia (congenital), or it may develop over time. In almost all cases of hiatal hernia, only the top part of the stomach pushes through the diaphragm. Many people have a hiatal hernia with no symptoms. The larger the hernia, the more likely it is that you will have symptoms. In some cases, a hiatal hernia allows stomach acid to flow back into the tube that carries food from your mouth to your stomach (esophagus). This may cause heartburn symptoms. Severe heartburn symptoms may mean that you have developed a condition called gastroesophageal reflux disease (GERD). What are the causes? This condition is caused by a weakness in the opening (hiatus) where the esophagus passes through the diaphragm to attach to the upper part of the stomach. A person may be born with a weakness in the hiatus, or a weakness can develop over time. What increases the risk? This condition is more likely to develop in:  Older people. Age is a major risk factor for a hiatal hernia, especially if you are over the age of 36.  Pregnant women.  People who are overweight.  People who have frequent constipation. What are the signs  or symptoms? Symptoms of this condition usually develop in the form of GERD symptoms. Symptoms include:  Heartburn.  Belching.  Indigestion.  Trouble swallowing.  Coughing or wheezing.  Sore throat.  Hoarseness.  Chest pain.  Nausea and vomiting. How is this diagnosed? This condition may be diagnosed during testing for GERD. Tests that may be done include:  X-rays of your stomach or  chest.  An upper gastrointestinal (GI) series. This is an X-ray exam of your GI tract that is taken after you swallow a chalky liquid that shows up clearly on the X-ray.  Endoscopy. This is a procedure to look into your stomach using a thin, flexible tube that has a tiny camera and light on the end of it. How is this treated? This condition may be treated by:  Dietary and lifestyle changes to help reduce GERD symptoms.  Medicines. These may include: ? Over-the-counter antacids. ? Medicines that make your stomach empty more quickly. ? Medicines that block the production of stomach acid (H2 blockers). ? Stronger medicines to reduce stomach acid (proton pump inhibitors).  Surgery to repair the hernia, if other treatments are not helping. If you have no symptoms, you may not need treatment. Follow these instructions at home: Lifestyle and activity  Do not use any products that contain nicotine or tobacco, such as cigarettes and e-cigarettes. If you need help quitting, ask your health care provider.  Try to achieve and maintain a healthy body weight.  Avoid putting pressure on your abdomen. Anything that puts pressure on your abdomen increases the amount of acid that may be pushed up into your esophagus. ? Avoid bending over, especially after eating. ? Raise the head of your bed by putting blocks under the legs. This keeps your head and esophagus higher than your stomach. ? Do not wear tight clothing around your chest or stomach. ? Try not to strain when having a bowel movement, when urinating, or when lifting heavy objects. Eating and drinking  Avoid foods that can worsen GERD symptoms. These may include: ? Fatty foods, like fried foods. ? Citrus fruits, like oranges or lemon. ? Other foods and drinks that contain acid, like orange juice or tomatoes. ? Spicy food. ? Chocolate.  Eat frequent small meals instead of three large meals a day. This helps prevent your stomach from getting  too full. ? Eat slowly. ? Do not lie down right after eating. ? Do not eat 1-2 hours before bed.  Do not drink beverages with caffeine. These include cola, coffee, cocoa, and tea.  Do not drink alcohol. General instructions  Take over-the-counter and prescription medicines only as told by your health care provider.  Keep all follow-up visits as told by your health care provider. This is important. Contact a health care provider if:  Your symptoms are not controlled with medicines or lifestyle changes.  You are having trouble swallowing.  You have coughing or wheezing that will not go away. Get help right away if:  Your pain is getting worse.  Your pain spreads to your arms, neck, jaw, teeth, or back.  You have shortness of breath.  You sweat for no reason.  You feel sick to your stomach (nauseous) or you vomit.  You vomit blood.  You have bright red blood in your stools.  You have black, tarry stools. This information is not intended to replace advice given to you by your health care provider. Make sure you discuss any questions you have with your health care provider.  Document Revised: 10/08/2017 Document Reviewed: 05/31/2017 Elsevier Patient Education  2020 [employer].

## 2020-10-16 NOTE — Op Note (Signed)
[facility] Patient Name: Jacob Hanna Procedure Date: 10/16/2020 8:07 AM MRN: [id number] Date of Birth: 1961-08-10 Attending MD: Katrinka Blazing ,  CSN: [id number] Age: 59 Admit Type: Outpatient Procedure:                Upper GI endoscopy Indications:              Pharyngeal phase dysphagia, Hiatal hernia Providers:                Katrinka Blazing, Criselda Peaches. Patsy Lager, RN, Angelica Ran, Pandora Leiter, Technician Referring MD:              Medicines:                Monitored Anesthesia Care Complications:            No immediate complications. Estimated Blood Loss:     Estimated blood loss: none. Procedure:                Pre-Anesthesia Assessment:                           - Prior to the procedure, a History and Physical                            was performed, and patient medications, allergies                            and sensitivities were reviewed. The patient's                            tolerance of previous anesthesia was reviewed.                           - The risks and benefits of the procedure and the                            sedation options and risks were discussed with the                            patient. All questions were answered and informed                            consent was obtained.                           - ASA Grade Assessment: II - A patient with mild                            systemic disease.                           After obtaining informed consent, the endoscope was                            passed under direct vision.  Throughout the                            procedure, the patient's blood pressure, pulse, and                            oxygen saturations were monitored continuously. The                            GIF-H190 ([id number]) scope was introduced through the                            mouth, and advanced to the second part of duodenum.                            The upper GI endoscopy was  technically difficult                            and complex due to abnormal anatomy. Successful                            completion of the procedure was aided by                            repositioning patient on his back. The patient                            tolerated the procedure well. Scope In: 8:33:01 AM Scope Out: 8:47:29 AM Total Procedure Duration: 0 hours 14 minutes 28 seconds  Findings:      A 5 cm paraesophageal hernia with a few Cameron erosions were found. The       anatomy of the stomach was severely distorted. Patient had to be       repositioned on his back to advance the scope to the antrum due to size       of hernia and constant looping. The hiatal narrowing was 41 cm from the       incisors. The Z-line was 36 cm from the incisors.      The exam of the esophagus was otherwise normal.      The entire examined stomach was normal.      Patchy mildly erythematous mucosa without active bleeding was found in       the first portion of the duodenum. Biopsies were taken with a cold       forceps for histology. Impression:               - 5 cm paraesophageal hernia with a few Cameron                            erosions. Severely distorted gastric anatomy.                           - Normal stomach.                           - Erythematous duodenopathy. Biopsied. Moderate Sedation:  Per Anesthesia Care Recommendation:           - Discharge patient to home (ambulatory).                           - Resume previous diet.                           - Await pathology results.                           - Return to referring physician as previously                            scheduled. Procedure Code(s):        --- Professional ---                           [phone], GC, Esophagogastroduodenoscopy, flexible,                            transoral; with biopsy, single or multiple Diagnosis Code(s):        --- Professional ---                           K44.9, Diaphragmatic  hernia without obstruction or                            gangrene                           K25.9, Gastric ulcer, unspecified as acute or                            chronic, without hemorrhage or perforation                           K31.89, Other diseases of stomach and duodenum                           R13.13, Dysphagia, pharyngeal phase CPT copyright 2019 American Medical Association. All rights reserved. The codes documented in this report are preliminary and upon coder review may  be revised to meet current compliance requirements. Katrinka Blazing, MD Katrinka Blazing,  10/16/2020 8:56:32 AM This report has been signed electronically. Number of Addenda: 0

## 2020-10-16 NOTE — Anesthesia Preprocedure Evaluation (Addendum)
Anesthesia Evaluation  Patient identified by MRN, date of birth, ID band Patient awake    Reviewed: Allergy & Precautions, NPO status , Patient's Chart, lab work & pertinent test results  History of Anesthesia Complications Negative for: history of anesthetic complications  Airway Mallampati: II  TM Distance: >3 FB Neck ROM: Full    Dental  (+) Dental Advisory Given Crown:   Pulmonary asthma , sleep apnea (mild) , COPD,  COPD inhaler,    Pulmonary exam normal breath sounds clear to auscultation       Cardiovascular Exercise Tolerance: Good Normal cardiovascular exam Rhythm:Regular Rate:Normal     Neuro/Psych negative neurological ROS  negative psych ROS   GI/Hepatic Neg liver ROS, hiatal hernia,   Endo/Other  negative endocrine ROS  Renal/GU negative Renal ROS  negative genitourinary   Musculoskeletal  (+) Arthritis ,   Abdominal   Peds  Hematology negative hematology ROS (+) anemia ,   Anesthesia Other Findings   Reproductive/Obstetrics negative OB ROS                            Anesthesia Physical Anesthesia Plan  ASA: II  Anesthesia Plan: General   Post-op Pain Management:    Induction: Intravenous  PONV Risk Score and Plan: TIVA  Airway Management Planned: Nasal Cannula and Natural Airway  Additional Equipment:   Intra-op Plan:   Post-operative Plan:   Informed Consent: I have reviewed the patients History and Physical, chart, labs and discussed the procedure including the risks, benefits and alternatives for the proposed anesthesia with the patient or authorized representative who has indicated his/her understanding and acceptance.       Plan Discussed with: CRNA and Surgeon  Anesthesia Plan Comments:        Anesthesia Quick Evaluation

## 2020-10-16 NOTE — Transfer of Care (Signed)
Immediate Anesthesia Transfer of Care Note  Patient: Jacob Hanna  Procedure(s) Performed: ESOPHAGOGASTRODUODENOSCOPY (EGD) WITH PROPOFOL (N/A ) BIOPSY  Patient Location: PACU  Anesthesia Type:General  Level of Consciousness: awake and alert   Airway & Oxygen Therapy: Patient Spontanous Breathing  Post-op Assessment: Report given to RN and Post -op Vital signs reviewed and stable  Post vital signs: Reviewed and stable  Last Vitals:  Vitals Value Taken Time  BP    Temp    Pulse    Resp    SpO2      Last Pain:  Vitals:   10/16/20 0828  TempSrc:   PainSc: 3       Patients Stated Pain Goal: 10 (10/16/20 0708)  Complications: No complications documented.

## 2020-10-16 NOTE — Anesthesia Postprocedure Evaluation (Signed)
Anesthesia Post Note  Patient: Jacob Hanna  Procedure(s) Performed: ESOPHAGOGASTRODUODENOSCOPY (EGD) WITH PROPOFOL (N/A ) BIOPSY  Patient location during evaluation: Phase II Anesthesia Type: General Level of consciousness: awake, awake and alert and oriented Pain management: pain level controlled Vital Signs Assessment: post-procedure vital signs reviewed and stable Respiratory status: spontaneous breathing and nonlabored ventilation Cardiovascular status: blood pressure returned to baseline Postop Assessment: no apparent nausea or vomiting Anesthetic complications: no   No complications documented.   Last Vitals:  Vitals:   10/16/20 0708 10/16/20 0851  BP: 133/80 126/84  Pulse: 68   Resp: 10 16  Temp: 36.7 C 36.6 C  SpO2: 98% 97%    Last Pain:  Vitals:   10/16/20 0851  TempSrc: Oral  PainSc: 0-No pain                 Lorin Glass

## 2020-10-16 NOTE — Interval H&P Note (Signed)
History and Physical Interval Note:  10/16/2020 8:20 AM Jacob Hanna is a 59 y.o. 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  Patient states that he is still having dysphagia frequently.  Denies having any nausea or vomiting but he reports that with each meal he feels that the food is not going down.  He has lost 15 pounds due to his dysphagia.  He is currently undergoing surgical evaluation for hiatal hernia repair as he has  3.5 cm hiatal hernia with 50% of the stomach herniated into the chest cavity.  Underwent recent esophageal manometry.  Denies taking any anticoagulation.  Last EGD: 2013 - Mild changes of reflux esophagitis limited to GE junction. Moderate size hiatal hernia with erosions involving herniated part of the stomach and at the level of hiatus. Incidental finding of 5 mm submucosal nodule at antrum possibly a small lipoma. Bulbar duodenitis.  BP 133/80   Pulse 68   Temp 98 F (36.7 C) (Oral)   Resp 10   Ht 5\' 10"  (1.778 m)   Wt 97.1 kg   SpO2 98%   BMI 30.71 kg/m  GENERAL: The patient is AO x3, in no acute distress. HEENT: Head is normocephalic and atraumatic. EOMI are intact. Mouth is well hydrated and without lesions. NECK: Supple. No masses LUNGS: Clear to auscultation. No presence of rhonchi/wheezing/rales. Adequate chest expansion HEART: RRR, normal s1 and s2. ABDOMEN: Soft, nontender, no guarding, no peritoneal signs, and nondistended. BS +. No masses. EXTREMITIES: Without any cyanosis, clubbing, rash, lesions or edema. NEUROLOGIC: AOx3, no focal motor deficit. SKIN: no jaundice, no rashes    Jacob Hanna  has presented today for surgery, with the diagnosis of Dysphagia.  The various methods of treatment have been discussed with the patient and family. After consideration of risks, benefits and other options for treatment, the patient has consented to  Procedure(s) with  comments: ESOPHAGOGASTRODUODENOSCOPY (EGD) WITH PROPOFOL (N/A) - 11:45 as a surgical intervention.  The patient's history has been reviewed, patient examined, no change in status, stable for surgery.  I have reviewed the patient's chart and labs.  Questions were answered to the patient's satisfaction.     [name]

## 2020-10-17 DIAGNOSIS — K219 Gastro-esophageal reflux disease without esophagitis: Secondary | ICD-10-CM

## 2020-10-17 DIAGNOSIS — Z01818 Encounter for other preprocedural examination: Secondary | ICD-10-CM

## 2020-10-17 LAB — SURGICAL PATHOLOGY

## 2020-10-22 ENCOUNTER — Encounter (HOSPITAL_COMMUNITY): Payer: Self-pay | Admitting: Gastroenterology

## 2020-11-04 ENCOUNTER — Encounter (HOSPITAL_COMMUNITY): Payer: Self-pay | Admitting: Anesthesiology

## 2020-11-05 NOTE — Progress Notes (Signed)
[facility] - [place], [place] - [address] [place] [address] Phone: [phone] Fax: [phone]  [facility] - [place], [place] - [address] [place] [address] Phone: [phone] Fax: [phone]  [facility] [place], [place] - [address] [place] [address] Phone: [phone] Fax: [phone]      Your procedure is scheduled on January 25  Report to [facility] Main Entrance "A" at 0900 A.M., and check in at the Admitting office.  Call this number if you have problems the morning of surgery:  [phone]  Call [phone] if you have any questions prior to your surgery date Monday-Friday 8am-4pm    Remember:  Do not eat after midnight the night before your surgery  You may drink clear liquids until 0800 am the morning of your surgery.   Clear liquids allowed are: Water, Non-Citrus Juices (without pulp), Carbonated Beverages, Clear Tea, Black Coffee Only, and Gatorade   Please complete your PRE-SURGERY ENSURE that was provided to you by 0800 am the morning of surgery.  Please, if able, drink it in one setting. DO NOT SIP.   Take these medicines the morning of surgery with A SIP OF WATER  albuterol (VENTOLIN HFA) if needed, Please bring all inhalers with you the day of surgery.  mometasone-formoterol (DULERA)    As of today, STOP taking any Aspirin (unless otherwise instructed by your surgeon) Aleve, Naproxen, Ibuprofen, Motrin, Advil, Goody's, BC's, all herbal medications, fish oil, and all vitamins.                      Do not wear jewelry            Do not wear lotions, powders, colognes, or deodorant.            Men may shave face and neck.            Do not bring valuables to the hospital.            [facility] is not responsible for any belongings or valuables.  Do NOT Smoke (Tobacco/Vaping) or drink Alcohol 24 hours prior to your procedure If you  use a CPAP at night, you may bring all equipment for your overnight stay.   Contacts, glasses, dentures or bridgework may not be worn into surgery.      For patients admitted to the hospital, discharge time will be determined by your treatment team.   Patients discharged the day of surgery will not be allowed to drive home, and someone needs to stay with them for 24 hours.    Special instructions:   - Preparing For Surgery  Before surgery, you can play an important role. Because skin is not sterile, your skin needs to be as free of germs as possible. You can reduce the number of germs on your skin by washing with CHG (chlorahexidine gluconate) Soap before surgery.  CHG is an antiseptic cleaner which kills germs and bonds with the skin to continue killing germs even after washing.    Oral Hygiene is also important to reduce your risk of infection.  Remember - BRUSH YOUR TEETH THE MORNING OF SURGERY WITH YOUR REGULAR TOOTHPASTE  Please do not use if you have an allergy to CHG or antibacterial soaps. If your skin becomes reddened/irritated stop using the CHG.  Do not shave (including legs and underarms) for at least 48 hours prior to first  CHG shower. It is OK to shave your face.  Please follow these instructions carefully.   1. Shower the NIGHT BEFORE SURGERY and the MORNING OF SURGERY with CHG Soap.   2. If you chose to wash your hair, wash your hair first as usual with your normal shampoo.  3. After you shampoo, rinse your hair and body thoroughly to remove the shampoo.  4. Use CHG as you would any other liquid soap. You can apply CHG directly to the skin and wash gently with a scrungie or a clean washcloth.   5. Apply the CHG Soap to your body ONLY FROM THE NECK DOWN.  Do not use on open wounds or open sores. Avoid contact with your eyes, ears, mouth and genitals (private parts). Wash Face and genitals (private parts)  with your normal soap.   6. Wash thoroughly, paying  special attention to the area where your surgery will be performed.  7. Thoroughly rinse your body with warm water from the neck down.  8. DO NOT shower/wash with your normal soap after using and rinsing off the CHG Soap.  9. Pat yourself dry with a CLEAN TOWEL.  10. Wear CLEAN PAJAMAS to bed the night before surgery  11. Place CLEAN SHEETS on your bed the night of your first shower and DO NOT SLEEP WITH PETS.   Day of Surgery: Wear Clean/Comfortable clothing the morning of surgery Do not apply any deodorants/lotions.   Remember to brush your teeth WITH YOUR REGULAR TOOTHPASTE.   Please read over the following fact sheets that you were given.

## 2020-11-06 ENCOUNTER — Encounter (HOSPITAL_COMMUNITY)
Admission: RE | Admit: 2020-11-06 | Discharge: 2020-11-06 | Disposition: A | Discharge status: Home / Self Care | Payer: Managed Care, Other (non HMO) | Source: Ambulatory Visit | Attending: General Surgery | Admitting: General Surgery

## 2020-11-06 ENCOUNTER — Other Ambulatory Visit: Payer: Self-pay

## 2020-11-06 ENCOUNTER — Encounter (HOSPITAL_COMMUNITY): Payer: Self-pay

## 2020-11-06 ENCOUNTER — Other Ambulatory Visit: Payer: Self-pay | Admitting: Obstetrics & Gynecology

## 2020-11-06 DIAGNOSIS — R7303 Prediabetes: Secondary | ICD-10-CM

## 2020-11-06 DIAGNOSIS — Z01812 Encounter for preprocedural laboratory examination: Secondary | ICD-10-CM | POA: Insufficient documentation

## 2020-11-06 HISTORY — DX: Gastro-esophageal reflux disease without esophagitis: K21.9

## 2020-11-06 HISTORY — DX: Disease of blood and blood-forming organs, unspecified: D75.9

## 2020-11-06 HISTORY — DX: Personal history of other diseases of the digestive system: Z87.19

## 2020-11-06 LAB — COMPREHENSIVE METABOLIC PANEL
ALT: 16 U/L (ref 0–44)
AST: 25 U/L (ref 15–41)
Albumin: 4.1 g/dL (ref 3.5–5.0)
Alkaline Phosphatase: 131 U/L — ABNORMAL HIGH (ref 38–126)
Anion gap: 8 (ref 5–15)
BUN: 11 mg/dL (ref 6–20)
CO2: 26 mmol/L (ref 22–32)
Calcium: 9.3 mg/dL (ref 8.9–10.3)
Chloride: 102 mmol/L (ref 98–111)
Creatinine, Ser: 0.97 mg/dL (ref 0.61–1.24)
GFR, Estimated: >60 mL/min (ref >60)
Glucose, Bld: 100 mg/dL — ABNORMAL HIGH (ref 70–99)
Potassium: 3.9 mmol/L (ref 3.5–5.1)
Sodium: 136 mmol/L (ref 135–145)
Total Bilirubin: 0.6 mg/dL (ref 0.3–1.2)
Total Protein: 7 g/dL (ref 6.5–8.1)

## 2020-11-06 LAB — CBC
HCT: 43.3 % (ref 39.0–52.0)
Hemoglobin: 14.8 g/dL (ref 13.0–17.0)
MCH: 31.9 pg (ref 26.0–34.0)
MCHC: 34.2 g/dL (ref 30.0–36.0)
MCV: 93.3 fL (ref 80.0–100.0)
Platelets: 251 10*3/uL (ref 150–400)
RBC: 4.64 MIL/uL (ref 4.22–5.81)
RDW: 12.1 % (ref 11.5–15.5)
WBC: 5.3 10*3/uL (ref 4.0–10.5)
nRBC: 0 % (ref 0.0–0.2)

## 2020-11-06 LAB — HEMOGLOBIN A1C
Hgb A1c MFr Bld: 5.4 % (ref 4.8–5.6)
Mean Plasma Glucose: 108.28 mg/dL

## 2020-11-06 NOTE — Progress Notes (Signed)
PCP - Lorin Picket Luking @ [facility] [place] Cardiologist - [facility]-saw 1 time 2018  Chest x-ray - 11/28/19 EKG - na Stress Test - 07/26/17 (media tab) ECHO - 07/26/20 Cardiac Cath - na  Sleep Study - yes-did not recommend cpap CPAP - na  Instructed pt. To continue taking ferrous sulfate  Blood Thinner Instructions:  na Aspirin Instructions: na  ERAS Protcol - yes PRE-SURGERY Ensure given  COVID TEST- 11/30/19   Anesthesia review:   Patient denies shortness of breath, fever, cough and chest pain at PAT appointment   All instructions explained to the patient, with a verbal understanding of the material. Patient agrees to go over the instructions while at home for a better understanding. Patient also instructed to self quarantine after being tested for COVID-19. The opportunity to ask questions was provided.

## 2020-11-06 NOTE — Progress Notes (Signed)
Adding on HgbA1c to pre op labs.  Last HgbA1c 3 months ago was elevated.

## 2020-11-25 ENCOUNTER — Other Ambulatory Visit (HOSPITAL_COMMUNITY): Payer: Managed Care, Other (non HMO)

## 2020-11-29 ENCOUNTER — Other Ambulatory Visit (HOSPITAL_COMMUNITY)
Admission: RE | Admit: 2020-11-29 | Discharge: 2020-11-29 | Disposition: A | Discharge status: Home / Self Care | Payer: Managed Care, Other (non HMO) | Source: Ambulatory Visit | Attending: General Surgery | Admitting: General Surgery

## 2020-11-29 DIAGNOSIS — Z01812 Encounter for preprocedural laboratory examination: Secondary | ICD-10-CM | POA: Diagnosis present

## 2020-11-29 DIAGNOSIS — Z20822 Contact with and (suspected) exposure to covid-19: Secondary | ICD-10-CM | POA: Insufficient documentation

## 2020-11-29 LAB — SARS CORONAVIRUS 2 (TAT 6-24 HRS): SARS Coronavirus 2: NEGATIVE

## 2020-12-02 NOTE — H&P (View-Only) (Signed)
History of Present Illness  The patient is a 60 year old male who presents with a hiatal hernia. Referred by: Dr. 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 Patient states he's had issues with reflux since 2019. He states that he has had issues with dysphagia recently. He states it is becoming more and more painful. He states he is able to have some liquids that pass however with breads, meats he has more dysphasia. He states that he does sometimes have dysphagia with liquids.  He previously had a CT scan in June 2019 which revealed approximately 3.5 cm hiatal hernia with 50% of the stomach herniated into the chest cavity.I did review this interpret this personally. Patient has had recent cystoscopy with Dr. Karilyn Cota in [place].    Patient had no previous abdominal surgery.   Past Surgical History Knee Surgery  Left. Oral Surgery  Vasectomy   Diagnostic Studies History  Colonoscopy  5-10 years ago  Allergies No Known Drug Allergies  [08/26/2020]: Allergies Reconciled   Medication History  Dulera (100-5MCG/ACT Aerosol, Inhalation) Active. Medications Reconciled  Social History Caffeine use  Carbonated beverages. No alcohol use  No drug use  Tobacco use  Never smoker.  Family History Heart Disease  Father. Heart disease in male family member before age 84  Respiratory Condition  Father.  Other Problems  Asthma  Chronic Obstructive Lung Disease     Review of Systems  General Not Present- Appetite Loss, Chills, Fatigue, Fever, Night Sweats, Weight Gain and Weight Loss. Skin Not Present- Change in Wart/Mole, Dryness, Hives, Jaundice, New Lesions, Non-Healing Wounds, Rash and Ulcer. HEENT Present- Seasonal Allergies and Wears glasses/contact lenses. Not Present- Earache, Hearing Loss, Hoarseness, Nose Bleed,  Oral Ulcers, Ringing in the Ears, Sinus Pain, Sore Throat, Visual Disturbances and Yellow Eyes. Respiratory Not Present- Bloody sputum, Chronic Cough, Difficulty Breathing, Snoring and Wheezing. Breast Not Present- Breast Mass, Breast Pain, Nipple Discharge and Skin Changes. Cardiovascular Not Present- Chest Pain, Difficulty Breathing Lying Down, Leg Cramps, Palpitations, Rapid Heart Rate, Shortness of Breath and Swelling of Extremities. Gastrointestinal Not Present- Abdominal Pain, Bloating, Bloody Stool, Change in Bowel Habits, Chronic diarrhea, Constipation, Difficulty Swallowing, Excessive gas, Gets full quickly at meals, Hemorrhoids, Indigestion, Nausea, Rectal Pain and Vomiting. Male Genitourinary Not Present- Blood in Urine, Change in Urinary Stream, Frequency, Impotence, Nocturia, Painful Urination, Urgency and Urine Leakage. Musculoskeletal Not Present- Back Pain, Joint Pain, Joint Stiffness, Muscle Pain, Muscle Weakness and Swelling of Extremities. Neurological Not Present- Decreased Memory, Fainting, Headaches, Numbness, Seizures, Tingling, Tremor, Trouble walking and Weakness. Psychiatric Not Present- Anxiety, Bipolar, Change in Sleep Pattern, Depression, Fearful and Frequent crying. Endocrine Not Present- Cold Intolerance, Excessive Hunger, Hair Changes, Heat Intolerance, Hot flashes and New Diabetes. Hematology Not Present- Blood Thinners, Easy Bruising, Excessive bleeding, Gland problems, HIV and Persistent Infections. All other systems negative  Vitals  08/26/2020 2:11 PM Weight: 223.25 lb Height: 69in Body Surface Area: 2.16 m Body Mass Index: 32.97 kg/m  Temp.: 97.95F  Pulse: 89 (Regular)  BP: 132/74(Sitting, Left Arm, Standard)       Physical Exam  The physical exam findings are as follows: Note: Constitutional: No acute distress, conversant, appears stated age  Eyes: Anicteric sclerae, moist conjunctiva, no lid lag  Neck: No thyromegaly,  trachea midline, no cervical lymphadenopathy  Lungs: Clear to auscultation biilaterally, normal respiratory effot  Cardiovascular: regular rate & rhythm, no murmurs,  no peripheal edema, pedal pulses 2+  GI: Soft, no masses or hepatosplenomegaly, non-tender to palpation  MSK: Normal gait, no clubbing cyanosis, edema  Skin: No rashes, palpation reveals normal skin turgor  Psychiatric: Appropriate judgment and insight, oriented to person, place, and time    Assessment & Plan  HIATAL HERNIA WITH GERD (K21.9) Impression: Patient is a 60 year old male, who comes in with a large hiatal hernia. 1. We'll have patient evaluated by Dr. Karilyn Cota with an endoscopy to directly for any further pathology 2. Patient will need a manometry study. We'll have him referred for this study 3. We'll have the patient undergo a robotic hiatal hernia repair and fundoplication. 4 Discussed with patient the risks and benefits of the procedure to include but not limited to: Infection, bleeding, damage to structures, possible pneumothorax, possible recurrence. The patient voiced understanding and wishes to proceed.  I reviewed the patient's external notes from the referring physicians as well as consulting physician team. Each of the radiologic studies and lab studies were independently reviewed and interpreted. I discussed the results of the above studies and how they relate to the patient's surgical problems

## 2020-12-02 NOTE — H&P (Signed)
History of Present Illness  The patient is a 60 year old male who presents with a hiatal hernia. Referred by: Dr. 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 Patient states he's had issues with reflux since 2019. He states that he has had issues with dysphagia recently. He states it is becoming more and more painful. He states he is able to have some liquids that pass however with breads, meats he has more dysphasia. He states that he does sometimes have dysphagia with liquids.  He previously had a CT scan in June 2019 which revealed approximately 3.5 cm hiatal hernia with 50% of the stomach herniated into the chest cavity.I did review this interpret this personally. Patient has had recent cystoscopy with Dr. Rehman in [place].    Patient had no previous abdominal surgery.   Past Surgical History Knee Surgery  Left. Oral Surgery  Vasectomy   Diagnostic Studies History  Colonoscopy  5-10 years ago  Allergies No Known Drug Allergies  [08/26/2020]: Allergies Reconciled   Medication History  Dulera (100-5MCG/ACT Aerosol, Inhalation) Active. Medications Reconciled  Social History Caffeine use  Carbonated beverages. No alcohol use  No drug use  Tobacco use  Never smoker.  Family History Heart Disease  Father. Heart disease in male family member before age 55  Respiratory Condition  Father.  Other Problems  Asthma  Chronic Obstructive Lung Disease     Review of Systems  General Not Present- Appetite Loss, Chills, Fatigue, Fever, Night Sweats, Weight Gain and Weight Loss. Skin Not Present- Change in Wart/Mole, Dryness, Hives, Jaundice, New Lesions, Non-Healing Wounds, Rash and Ulcer. HEENT Present- Seasonal Allergies and Wears glasses/contact lenses. Not Present- Earache, Hearing Loss, Hoarseness, Nose Bleed,  Oral Ulcers, Ringing in the Ears, Sinus Pain, Sore Throat, Visual Disturbances and Yellow Eyes. Respiratory Not Present- Bloody sputum, Chronic Cough, Difficulty Breathing, Snoring and Wheezing. Breast Not Present- Breast Mass, Breast Pain, Nipple Discharge and Skin Changes. Cardiovascular Not Present- Chest Pain, Difficulty Breathing Lying Down, Leg Cramps, Palpitations, Rapid Heart Rate, Shortness of Breath and Swelling of Extremities. Gastrointestinal Not Present- Abdominal Pain, Bloating, Bloody Stool, Change in Bowel Habits, Chronic diarrhea, Constipation, Difficulty Swallowing, Excessive gas, Gets full quickly at meals, Hemorrhoids, Indigestion, Nausea, Rectal Pain and Vomiting. Male Genitourinary Not Present- Blood in Urine, Change in Urinary Stream, Frequency, Impotence, Nocturia, Painful Urination, Urgency and Urine Leakage. Musculoskeletal Not Present- Back Pain, Joint Pain, Joint Stiffness, Muscle Pain, Muscle Weakness and Swelling of Extremities. Neurological Not Present- Decreased Memory, Fainting, Headaches, Numbness, Seizures, Tingling, Tremor, Trouble walking and Weakness. Psychiatric Not Present- Anxiety, Bipolar, Change in Sleep Pattern, Depression, Fearful and Frequent crying. Endocrine Not Present- Cold Intolerance, Excessive Hunger, Hair Changes, Heat Intolerance, Hot flashes and New Diabetes. Hematology Not Present- Blood Thinners, Easy Bruising, Excessive bleeding, Gland problems, HIV and Persistent Infections. All other systems negative  Vitals  08/26/2020 2:11 PM Weight: 223.25 lb Height: 69in Body Surface Area: 2.16 m Body Mass Index: 32.97 kg/m  Temp.: 97.8F  Pulse: 89 (Regular)  BP: 132/74(Sitting, Left Arm, Standard)       Physical Exam  The physical exam findings are as follows: Note: Constitutional: No acute distress, conversant, appears stated age  Eyes: Anicteric sclerae, moist conjunctiva, no lid lag  Neck: No thyromegaly,  trachea midline, no cervical lymphadenopathy  Lungs: Clear to auscultation biilaterally, normal respiratory effot  Cardiovascular: regular rate & rhythm, no murmurs,   no peripheal edema, pedal pulses 2+  GI: Soft, no masses or hepatosplenomegaly, non-tender to palpation  MSK: Normal gait, no clubbing cyanosis, edema  Skin: No rashes, palpation reveals normal skin turgor  Psychiatric: Appropriate judgment and insight, oriented to person, place, and time    Assessment & Plan  HIATAL HERNIA WITH GERD (K21.9) Impression: Patient is a 60 year old male, who comes in with a large hiatal hernia. 1. We'll have patient evaluated by Dr. Karilyn Cota with an endoscopy to directly for any further pathology 2. Patient will need a manometry study. We'll have him referred for this study 3. We'll have the patient undergo a robotic hiatal hernia repair and fundoplication. 4 Discussed with patient the risks and benefits of the procedure to include but not limited to: Infection, bleeding, damage to structures, possible pneumothorax, possible recurrence. The patient voiced understanding and wishes to proceed.  I reviewed the patient's external notes from the referring physicians as well as consulting physician team. Each of the radiologic studies and lab studies were independently reviewed and interpreted. I discussed the results of the above studies and how they relate to the patient's surgical problems

## 2020-12-03 ENCOUNTER — Inpatient Hospital Stay (HOSPITAL_COMMUNITY): Payer: Managed Care, Other (non HMO) | Admitting: Anesthesiology

## 2020-12-03 ENCOUNTER — Encounter (HOSPITAL_COMMUNITY): Admission: RE | Payer: Self-pay | Source: Home / Self Care

## 2020-12-03 ENCOUNTER — Encounter (HOSPITAL_COMMUNITY): Payer: Self-pay | Admitting: General Surgery

## 2020-12-03 ENCOUNTER — Encounter (HOSPITAL_COMMUNITY)
Admission: AD | Disposition: A | Discharge status: Home / Self Care | Payer: Self-pay | Source: Ambulatory Visit | Attending: General Surgery

## 2020-12-03 ENCOUNTER — Other Ambulatory Visit: Payer: Self-pay

## 2020-12-03 ENCOUNTER — Inpatient Hospital Stay (HOSPITAL_COMMUNITY)
Admission: RE | Admit: 2020-12-03 | Payer: Managed Care, Other (non HMO) | Source: Home / Self Care | Admitting: General Surgery

## 2020-12-03 ENCOUNTER — Observation Stay (HOSPITAL_COMMUNITY)
Admission: AD | Admit: 2020-12-03 | Discharge: 2020-12-04 | Disposition: A | Discharge status: Home / Self Care | Payer: Managed Care, Other (non HMO) | Source: Ambulatory Visit | Attending: General Surgery | Admitting: General Surgery

## 2020-12-03 DIAGNOSIS — K449 Diaphragmatic hernia without obstruction or gangrene: Secondary | ICD-10-CM | POA: Diagnosis present

## 2020-12-03 DIAGNOSIS — K219 Gastro-esophageal reflux disease without esophagitis: Secondary | ICD-10-CM | POA: Diagnosis not present

## 2020-12-03 DIAGNOSIS — T65834S Toxic effect of fiberglass, undetermined, sequela: Secondary | ICD-10-CM | POA: Diagnosis not present

## 2020-12-03 DIAGNOSIS — J449 Chronic obstructive pulmonary disease, unspecified: Secondary | ICD-10-CM | POA: Insufficient documentation

## 2020-12-03 DIAGNOSIS — Z9889 Other specified postprocedural states: Secondary | ICD-10-CM

## 2020-12-03 HISTORY — PX: INSERTION OF MESH: SHX5868

## 2020-12-03 HISTORY — PX: XI ROBOTIC ASSISTED HIATAL HERNIA REPAIR: SHX6889

## 2020-12-03 SURGERY — REPAIR, HERNIA, HIATAL, ROBOT-ASSISTED
Anesthesia: General | Site: Abdomen

## 2020-12-03 SURGERY — REPAIR, HERNIA, HIATAL, ROBOT-ASSISTED
Anesthesia: General

## 2020-12-03 MED ORDER — BUPIVACAINE LIPOSOME 1.3 % IJ SUSP
20.0000 mL | INTRAMUSCULAR | Status: AC
  Administered 2020-12-03: 20 mL
  Filled 2020-12-03: qty 20

## 2020-12-03 MED ORDER — LIDOCAINE 2% (20 MG/ML) 5 ML SYRINGE
INTRAMUSCULAR | Status: DC | PRN
  Administered 2020-12-03: 100 mg via INTRAVENOUS

## 2020-12-03 MED ORDER — ENOXAPARIN SODIUM 40 MG/0.4ML ~~LOC~~ SOLN
40.0000 mg | SUBCUTANEOUS | Status: DC
  Administered 2020-12-04: 40 mg via SUBCUTANEOUS
  Filled 2020-12-03: qty 0.4

## 2020-12-03 MED ORDER — BUPIVACAINE HCL (PF) 0.25 % IJ SOLN
INTRAMUSCULAR | Status: AC
  Filled 2020-12-03: qty 30

## 2020-12-03 MED ORDER — ORAL CARE MOUTH RINSE: 15.0000 mL | Freq: Once | OROMUCOSAL | Status: AC

## 2020-12-03 MED ORDER — FENTANYL CITRATE (PF) 100 MCG/2ML IJ SOLN
25.0000 ug | INTRAMUSCULAR | Status: DC | PRN
  Administered 2020-12-03: 25 ug via INTRAVENOUS
  Administered 2020-12-03: 50 ug via INTRAVENOUS
  Administered 2020-12-03: 25 ug via INTRAVENOUS

## 2020-12-03 MED ORDER — CHLORHEXIDINE GLUCONATE CLOTH 2 % EX PADS: 6.0000 | MEDICATED_PAD | Freq: Once | CUTANEOUS | Status: DC

## 2020-12-03 MED ORDER — 0.9 % SODIUM CHLORIDE (POUR BTL) OPTIME
TOPICAL | Status: DC | PRN
  Administered 2020-12-03: 1000 mL

## 2020-12-03 MED ORDER — BUPIVACAINE HCL (PF) 0.25 % IJ SOLN
INTRAMUSCULAR | Status: DC | PRN
  Administered 2020-12-03: 15 mL

## 2020-12-03 MED ORDER — GABAPENTIN 300 MG PO CAPS
300.0000 mg | ORAL_CAPSULE | ORAL | Status: AC
  Administered 2020-12-03: 300 mg via ORAL

## 2020-12-03 MED ORDER — ALBUTEROL SULFATE HFA 108 (90 BASE) MCG/ACT IN AERS
2.0000 | INHALATION_SPRAY | RESPIRATORY_TRACT | Status: DC | PRN
  Filled 2020-12-03: qty 6.7

## 2020-12-03 MED ORDER — SUGAMMADEX SODIUM 200 MG/2ML IV SOLN
INTRAVENOUS | Status: DC | PRN
  Administered 2020-12-03: 200 mg via INTRAVENOUS

## 2020-12-03 MED ORDER — SODIUM CHLORIDE 0.9% FLUSH
INTRAVENOUS | Status: DC | PRN
  Administered 2020-12-03 (×2): 10 mL

## 2020-12-03 MED ORDER — ROCURONIUM BROMIDE 10 MG/ML (PF) SYRINGE
PREFILLED_SYRINGE | INTRAVENOUS | Status: DC | PRN
  Administered 2020-12-03: 100 mg via INTRAVENOUS
  Administered 2020-12-03: 30 mg via INTRAVENOUS

## 2020-12-03 MED ORDER — DEXAMETHASONE SODIUM PHOSPHATE 10 MG/ML IJ SOLN
INTRAMUSCULAR | Status: DC | PRN
  Administered 2020-12-03: 10 mg via INTRAVENOUS

## 2020-12-03 MED ORDER — CEFAZOLIN SODIUM-DEXTROSE 2-4 GM/100ML-% IV SOLN
2.0000 g | INTRAVENOUS | Status: AC
  Administered 2020-12-03: 2 g via INTRAVENOUS

## 2020-12-03 MED ORDER — ALBUTEROL SULFATE HFA 108 (90 BASE) MCG/ACT IN AERS
2.0000 | INHALATION_SPRAY | RESPIRATORY_TRACT | Status: DC
  Filled 2020-12-03: qty 6.7

## 2020-12-03 MED ORDER — FENTANYL CITRATE (PF) 250 MCG/5ML IJ SOLN
INTRAMUSCULAR | Status: DC | PRN
  Administered 2020-12-03: 100 ug via INTRAVENOUS
  Administered 2020-12-03 (×3): 50 ug via INTRAVENOUS

## 2020-12-03 MED ORDER — MIDAZOLAM HCL 5 MG/5ML IJ SOLN
INTRAMUSCULAR | Status: DC | PRN
  Administered 2020-12-03: 2 mg via INTRAVENOUS

## 2020-12-03 MED ORDER — SODIUM CHLORIDE 0.9 % IR SOLN
Status: DC | PRN
  Administered 2020-12-03: 1000 mL

## 2020-12-03 MED ORDER — ONDANSETRON 4 MG PO TBDP: 4.0000 mg | ORAL_TABLET | Freq: Four times a day (QID) | ORAL | Status: DC | PRN

## 2020-12-03 MED ORDER — ACETAMINOPHEN 500 MG PO TABS
1000.0000 mg | ORAL_TABLET | ORAL | Status: AC
  Administered 2020-12-03: 1000 mg via ORAL

## 2020-12-03 MED ORDER — PROPOFOL 10 MG/ML IV BOLUS
INTRAVENOUS | Status: DC | PRN
  Administered 2020-12-03: 180 mg via INTRAVENOUS

## 2020-12-03 MED ORDER — ONDANSETRON HCL 4 MG/2ML IJ SOLN: 4.0000 mg | Freq: Four times a day (QID) | INTRAMUSCULAR | Status: DC | PRN

## 2020-12-03 MED ORDER — ALBUMIN HUMAN 5 % IV SOLN: INTRAVENOUS | Status: DC | PRN

## 2020-12-03 MED ORDER — AMISULPRIDE (ANTIEMETIC) 5 MG/2ML IV SOLN: 10.0000 mg | Freq: Once | INTRAVENOUS | Status: DC | PRN

## 2020-12-03 MED ORDER — ONDANSETRON HCL 4 MG/2ML IJ SOLN
INTRAMUSCULAR | Status: DC | PRN
  Administered 2020-12-03: 4 mg via INTRAVENOUS

## 2020-12-03 MED ORDER — KETOROLAC TROMETHAMINE 15 MG/ML IJ SOLN
15.0000 mg | Freq: Four times a day (QID) | INTRAMUSCULAR | Status: DC | PRN
  Administered 2020-12-03 – 2020-12-04 (×4): 15 mg via INTRAVENOUS
  Filled 2020-12-03 (×3): qty 1

## 2020-12-03 MED ORDER — DEXTROSE-NACL 5-0.9 % IV SOLN: INTRAVENOUS | Status: DC

## 2020-12-03 MED ORDER — LACTATED RINGERS IV SOLN: INTRAVENOUS | Status: DC

## 2020-12-03 MED ORDER — ENSURE PRE-SURGERY PO LIQD: 296.0000 mL | Freq: Once | ORAL | Status: DC

## 2020-12-03 MED ORDER — PHENYLEPHRINE HCL-NACL 10-0.9 MG/250ML-% IV SOLN
INTRAVENOUS | Status: DC | PRN
  Administered 2020-12-03: 20 ug/min via INTRAVENOUS

## 2020-12-03 MED ORDER — CHLORHEXIDINE GLUCONATE 0.12 % MT SOLN
15.0000 mL | Freq: Once | OROMUCOSAL | Status: AC
  Administered 2020-12-03: 15 mL via OROMUCOSAL

## 2020-12-03 MED ORDER — HYDROMORPHONE HCL 1 MG/ML IJ SOLN
0.5000 mg | INTRAMUSCULAR | Status: DC | PRN
  Administered 2020-12-03: 0.5 mg via INTRAVENOUS
  Filled 2020-12-03: qty 0.5

## 2020-12-03 SURGICAL SUPPLY — 55 items
APPLIER CLIP 5 13 M/L LIGAMAX5 (MISCELLANEOUS)
CHLORAPREP W/TINT 26 (MISCELLANEOUS) ×2 IMPLANT
CLIP APPLIE 5 13 M/L LIGAMAX5 (MISCELLANEOUS) IMPLANT
COVER MAYO STAND STRL (DRAPES) ×2 IMPLANT
COVER SURGICAL LIGHT HANDLE (MISCELLANEOUS) ×2 IMPLANT
COVER TIP SHEARS 8 DVNC (MISCELLANEOUS) IMPLANT
COVER TIP SHEARS 8MM DA VINCI (MISCELLANEOUS)
DECANTER SPIKE VIAL GLASS SM (MISCELLANEOUS) ×2 IMPLANT
DEFOGGER SCOPE WARMER CLEARIFY (MISCELLANEOUS) ×2 IMPLANT
DERMABOND ADVANCED (GAUZE/BANDAGES/DRESSINGS) ×1
DERMABOND ADVANCED .7 DNX12 (GAUZE/BANDAGES/DRESSINGS) ×1 IMPLANT
DEVICE TROCAR PUNCTURE CLOSURE (ENDOMECHANICALS) ×2 IMPLANT
DRAIN PENROSE 0.5X18 (DRAIN) IMPLANT
DRAPE ARM DVNC X/XI (DISPOSABLE) ×4 IMPLANT
DRAPE CARDIOVASC SPLIT 88X140 (DRAPES) ×2 IMPLANT
DRAPE COLUMN DVNC XI (DISPOSABLE) ×1 IMPLANT
DRAPE DA VINCI XI ARM (DISPOSABLE) ×8
DRAPE DA VINCI XI COLUMN (DISPOSABLE) ×2
DRAPE ORTHO SPLIT 77X108 STRL (DRAPES) ×2
DRAPE SURG ORHT 6 SPLT 77X108 (DRAPES) ×1 IMPLANT
ELECT REM PT RETURN 9FT ADLT (ELECTROSURGICAL) ×2
ELECTRODE REM PT RTRN 9FT ADLT (ELECTROSURGICAL) ×1 IMPLANT
GLOVE BIO SURGEON STRL SZ7.5 (GLOVE) ×4 IMPLANT
GOWN STRL REUS W/ TWL XL LVL3 (GOWN DISPOSABLE) ×2 IMPLANT
GOWN STRL REUS W/TWL 2XL LVL3 (GOWN DISPOSABLE) ×2 IMPLANT
GOWN STRL REUS W/TWL XL LVL3 (GOWN DISPOSABLE) ×4
KIT BASIN OR (CUSTOM PROCEDURE TRAY) ×2 IMPLANT
KIT TURNOVER KIT B (KITS) ×2 IMPLANT
MARKER SKIN DUAL TIP RULER LAB (MISCELLANEOUS) ×2 IMPLANT
MESH BIO-A 7X10 SYN MAT (Mesh General) ×2 IMPLANT
NEEDLE 22X1 1/2 (OR ONLY) (NEEDLE) ×2 IMPLANT
NEEDLE INSUFFLATION 14GA 120MM (NEEDLE) ×2 IMPLANT
OBTURATOR OPTICAL STANDARD 8MM (TROCAR)
OBTURATOR OPTICAL STND 8 DVNC (TROCAR)
OBTURATOR OPTICALSTD 8 DVNC (TROCAR) IMPLANT
PENCIL SMOKE EVACUATOR (MISCELLANEOUS) IMPLANT
SCISSORS LAP 5X35 DISP (ENDOMECHANICALS) IMPLANT
SEAL CANN UNIV 5-8 DVNC XI (MISCELLANEOUS) ×4 IMPLANT
SEAL XI 5MM-8MM UNIVERSAL (MISCELLANEOUS) ×8
SEALER VESSEL DA VINCI XI (MISCELLANEOUS) ×2
SEALER VESSEL EXT DVNC XI (MISCELLANEOUS) ×1 IMPLANT
SET IRRIG TUBING LAPAROSCOPIC (IRRIGATION / IRRIGATOR) ×2 IMPLANT
SET TUBE SMOKE EVAC HIGH FLOW (TUBING) ×2 IMPLANT
STAPLER VISISTAT 35W (STAPLE) IMPLANT
STOPCOCK 4 WAY LG BORE MALE ST (IV SETS) ×2 IMPLANT
SUT ETHIBOND 0 36 GRN (SUTURE) ×4 IMPLANT
SUT ETHIBOND 2 0 SH (SUTURE)
SUT ETHIBOND 2 0 SH 36X2 (SUTURE) IMPLANT
SUT MNCRL AB 4-0 PS2 18 (SUTURE) ×2 IMPLANT
SUT SILK 0 SH 30 (SUTURE) ×4 IMPLANT
SYR 30ML SLIP (SYRINGE) ×2 IMPLANT
SYR CONTROL 10ML LL (SYRINGE) ×2 IMPLANT
TRAY FOLEY MTR SLVR 16FR STAT (SET/KITS/TRAYS/PACK) IMPLANT
TRAY LAPAROSCOPIC MC (CUSTOM PROCEDURE TRAY) ×2 IMPLANT
TROCAR ADV FIXATION 5X100MM (TROCAR) ×2 IMPLANT

## 2020-12-03 NOTE — Anesthesia Procedure Notes (Signed)
Procedure Name: Intubation Date/Time: 12/03/2020 10:40 AM Performed by: Griffin Dakin, CRNA Pre-anesthesia Checklist: Patient identified, Emergency Drugs available, Suction available and Patient being monitored Patient Re-evaluated:Patient Re-evaluated prior to induction Oxygen Delivery Method: Circle system utilized Preoxygenation: Pre-oxygenation with 100% oxygen Induction Type: IV induction Ventilation: Mask ventilation without difficulty and Oral airway inserted - appropriate to patient size Laryngoscope Size: Mac and 4 Grade View: Grade II Tube type: Oral Tube size: 7.5 mm Number of attempts: 1 Airway Equipment and Method: Stylet and Oral airway Placement Confirmation: ETT inserted through vocal cords under direct vision,  positive ETCO2 and breath sounds checked- equal and bilateral Secured at: 25 cm Tube secured with: Tape Dental Injury: Teeth and Oropharynx as per pre-operative assessment

## 2020-12-03 NOTE — Anesthesia Postprocedure Evaluation (Signed)
Anesthesia Post Note  Patient: Jacob Hanna  Procedure(s) Performed: XI ROBOTIC ASSISTED HIATAL HERNIA REPAIR WITH MESH AND FUNDOPLICATION (N/A Abdomen) INSERTION OF MESH (N/A Abdomen)     Patient location during evaluation: PACU Anesthesia Type: General Level of consciousness: sedated Pain management: pain level controlled Vital Signs Assessment: post-procedure vital signs reviewed and stable Respiratory status: spontaneous breathing and respiratory function stable Cardiovascular status: stable Postop Assessment: no apparent nausea or vomiting Anesthetic complications: no   No complications documented.  Last Vitals:  Vitals:   12/03/20 1400 12/03/20 1411  BP:  131/82  Pulse: 62 64  Resp: 13 12  Temp:    SpO2: 99% 99%    Last Pain:  Vitals:   12/03/20 1400  TempSrc:   PainSc: Asleep                 Antone Summons DANIEL

## 2020-12-03 NOTE — Transfer of Care (Signed)
Immediate Anesthesia Transfer of Care Note  Patient: Jacob Hanna  Procedure(s) Performed: XI ROBOTIC ASSISTED HIATAL HERNIA REPAIR WITH MESH AND FUNDOPLICATION (N/A Abdomen) INSERTION OF MESH (N/A Abdomen)  Patient Location: PACU  Anesthesia Type:General  Level of Consciousness: awake, alert  and oriented  Airway & Oxygen Therapy: Patient Spontanous Breathing  Post-op Assessment: Report given to RN and Post -op Vital signs reviewed and stable  Post vital signs: Reviewed and stable  Last Vitals:  Vitals Value Taken Time  BP 152/96 12/03/20 1300  Temp    Pulse 71 12/03/20 1301  Resp 13 12/03/20 1301  SpO2 97 % 12/03/20 1301  Vitals shown include unvalidated device data.  Last Pain:  Vitals:   12/03/20 0945  TempSrc:   PainSc: 0-No pain         Complications: No complications documented.

## 2020-12-03 NOTE — Anesthesia Preprocedure Evaluation (Addendum)
Anesthesia Evaluation  Patient identified by MRN, date of birth, ID band Patient awake    Reviewed: Allergy & Precautions, NPO status , Patient's Chart, lab work & pertinent test results  History of Anesthesia Complications Negative for: history of anesthetic complications  Airway Mallampati: III  TM Distance: >3 FB Neck ROM: Full    Dental no notable dental hx. (+) Dental Advisory Given [place]:   Pulmonary asthma , sleep apnea (mild) , COPD,  COPD inhaler,    Pulmonary exam normal        Cardiovascular Exercise Tolerance: Good Normal cardiovascular exam     Neuro/Psych negative neurological ROS  negative psych ROS   GI/Hepatic Neg liver ROS, hiatal hernia, GERD  ,  Endo/Other  negative endocrine ROS  Renal/GU negative Renal ROS  negative genitourinary   Musculoskeletal  (+) Arthritis ,   Abdominal   Peds  Hematology negative hematology ROS (+) anemia ,   Anesthesia Other Findings   Reproductive/Obstetrics negative OB ROS                            Anesthesia Physical  Anesthesia Plan  ASA: II  Anesthesia Plan: General   Post-op Pain Management:    Induction: Intravenous  PONV Risk Score and Plan: 4 or greater and Ondansetron, Dexamethasone, Midazolam and Scopolamine patch - Pre-op  Airway Management Planned: Oral ETT  Additional Equipment:   Intra-op Plan:   Post-operative Plan: Extubation in OR  Informed Consent: I have reviewed the patients History and Physical, chart, labs and discussed the procedure including the risks, benefits and alternatives for the proposed anesthesia with the patient or authorized representative who has indicated his/her understanding and acceptance.     Dental advisory given  Plan Discussed with: Anesthesiologist and CRNA  Anesthesia Plan Comments:        Anesthesia Quick Evaluation

## 2020-12-03 NOTE — Op Note (Signed)
12/03/2020  12:35 PM  PATIENT:  Jacob Hanna  60 y.o. male  PRE-OPERATIVE DIAGNOSIS:  Hiatal Hernia  POST-OPERATIVE DIAGNOSIS:  Hiatal Hernia  PROCEDURE:  Procedure(s): XI ROBOTIC ASSISTED HIATAL HERNIA REPAIR WITH MESH AND TOUPET FUNDOPLICATION (N/A) INSERTION OF MESH GORE BIO-A (N/A)  SURGEON:  Surgeon(s) and Role:    Axel Filler, MD - Primary   ASSISTANTS: Berenda Morale, RNFA   ANESTHESIA:   local and general  EBL:  minimal   BLOOD ADMINISTERED:none  DRAINS: none   LOCAL MEDICATIONS USED:  BUPIVICAINE  and OTHER EXPARIL  SPECIMEN:  No Specimen  DISPOSITION OF SPECIMEN:  N/A  COUNTS:  YES  TOURNIQUET:  * No tourniquets in log *  DICTATION: .Dragon Dictation The patient was taken back to the operating room and placed in the supine position with bilateral SCDs in place. The patient was prepped and draped in the usual sterile fashion. After appropriate antibiotics were confirmed a timeout was called and all facts were verified.   A Veress needle technique was used to insufflate the abdomen to 15 mm of mercury the paramedian stab incision. Subsequent to this an 8 mm trocar was introduced as was a 8 millimeter camera. At this time the subsequent robotic trochars x3, were then placed adjacent to this trocar approximately 8-10 cm away. Each trocar was inserted under direct visualization, there were total of 4 trochars. The assistant trocar was then placed in the right lower quadrant under direct visualization. The Nathanson retractor was then visualized inserted into the abdomen and the incision just to the left of the falciform ligament. This was then placed to retract the liver appropriately. At this time the patient was positioned in reverse Trendelenburg.   At this time the robot patient cart was brought to the bedside and placed in good position and the arms were docked to the trochars appropriately. At this time I proceeded to incised the gastrohepatic  ligament.  At this time I proceeded to mobilize the stomach inferiorly and visualize the right crus. The peritoneum over the right crus was incised and right crus was identified. I proceeded to dissect this inferiorly until the left crus was seen joining the right crus. Once the right crus was adequately dissected we turned our to the left crus which was dissected away. This required traction of the stomach to the right side. Once this was visualized we then proceeded to circumferentially dissect the esophagus away from the surrounding tissue. The anterior and posterior vagus was seen along the esophagus at the GE junction.  These were both preserved throughout the entire case.  At this time the phrenoesophageal fat pad was dissected away from the esophagus. There was a large-sized hiatal hernia seen. I mobilized the esophagus cephalad approximately 4-5 cm, clearing away the surrounding tissue. The anterior hernia sac was dissected away from the stomach and esophagus and discarded  At this time we turned our attention to the greater curvature the stomach and the omentum was mobilized using the robotic vessel sealer. This was taken up to the greater curvature to the hiatus. This mobilized the entire greater curvature to allow mobilization and the wrap. I then proceeded to bring the greater curvature the stomach posterior to the esophagus, and a shoeshine technique was used to evaluate the mobilization of the greater curvature.   At this time I proceeded to close the hiatus using interrupted 0 Ethibonds x 4. This brought together the hiatal closure without undue stricture to the esophagus. This was  done a lower insufflation pressure.  A piece of Gore Bio A hiatal mesh was placed over the hiatal closure and sutured to the crus using 0 ethibond sutures x 4.  At this time the greater curvature was brought around the esophagus and sutured using 0 silk sutures interrupted fashion approximately 1 cm apart x3 on each  side of the esophagus in a Toupet fashion. A left collar stitch was then used to gastropexy the stomach from the wrap to the diaphragm just lateral to the left crus as.  A second collar stitch was placed from the wrap to the right crus. The wrap lay posteriorly on its own with undue tension.  The wrap lay loose with no strangulation of the esophagus.  At this time the robot was undocked. The liver trocar was removed. At this time insufflation was evacuated. Skin was reapproximated for Monocryl subcuticular fashion. The skin was then dressed with Dermabond. The patient tolerated the procedure well and was taken to the recovery room in stable condition.    PLAN OF CARE: Admit for overnight observation  PATIENT DISPOSITION:  PACU - hemodynamically stable.   Delay start of Pharmacological VTE agent (>24hrs) due to surgical blood loss or risk of bleeding: not applicable

## 2020-12-03 NOTE — Interval H&P Note (Signed)
History and Physical Interval Note:  12/03/2020 9:37 AM  Jacob Hanna  has presented today for surgery, with the diagnosis of Hiatal Hernia.  The various methods of treatment have been discussed with the patient and family. After consideration of risks, benefits and other options for treatment, the patient has consented to  Procedure(s): XI ROBOTIC ASSISTED HIATAL HERNIA REPAIR WITH MESH AND FUNDOPLICATION (N/A) as a surgical intervention.  The patient's history has been reviewed, patient examined, no change in status, stable for surgery.  I have reviewed the patient's chart and labs.  Questions were answered to the patient's satisfaction.     Axel Filler

## 2020-12-04 ENCOUNTER — Observation Stay (HOSPITAL_COMMUNITY): Payer: Managed Care, Other (non HMO)

## 2020-12-04 ENCOUNTER — Encounter (HOSPITAL_COMMUNITY): Payer: Self-pay | Admitting: General Surgery

## 2020-12-04 DIAGNOSIS — K449 Diaphragmatic hernia without obstruction or gangrene: Secondary | ICD-10-CM | POA: Diagnosis not present

## 2020-12-04 MED ORDER — IOHEXOL 300 MG/ML  SOLN
50.0000 mL | Freq: Once | INTRAMUSCULAR | Status: AC | PRN
  Administered 2020-12-04: 50 mL via ORAL

## 2020-12-04 MED ORDER — HYDROCODONE-ACETAMINOPHEN 7.5-325 MG/15ML PO SOLN: 15.0000 mL | Freq: Four times a day (QID) | ORAL | 0 refills | Status: DC | PRN

## 2020-12-04 NOTE — Discharge Instructions (Signed)
EATING AFTER YOUR ESOPHAGEAL SURGERY (Stomach Fundoplication, Hiatal Hernia repair, Achalasia surgery, etc)  ######################################################################  EAT Start with a pureed / full liquid diet (see below) Gradually transition to a high fiber diet with a fiber supplement over the next month after discharge.    WALK Walk an hour a day.  Control your pain to do that.    CONTROL PAIN Control pain so that you can walk, sleep, tolerate sneezing/coughing, go up/down stairs.  HAVE A BOWEL MOVEMENT DAILY Keep your bowels regular to avoid problems.  OK to try a laxative to override constipation.  OK to use an antidairrheal to slow down diarrhea.  Call if not better after 2 tries  CALL IF YOU HAVE PROBLEMS/CONCERNS Call if you are still struggling despite following these instructions. Call if you have concerns not answered by these instructions  ######################################################################   After your esophageal surgery, expect some sticking with swallowing over the next 1-2 months.    If food sticks when you eat, it is called "dysphagia".  This is due to swelling around your esophagus at the wrap & hiatal diaphragm repair.  It will gradually ease off over the next few months.  To help you through this temporary phase, we start you out on a pureed (blenderized) diet.  Your first meal in the hospital was thin liquids.  You should have been given a pureed diet by the time you left the hospital.  We ask patients to stay on a pureed diet for the first 2-3 weeks to avoid anything getting "stuck" near your recent surgery.  Don't be alarmed if your ability to swallow doesn't progress according to this plan.  Everyone is different and some diets can advance more or less quickly.    It is often helpful to crush your medications or split them as they can sometimes stick, especially the first week or so.   Some BASIC RULES to follow  are:  Maintain an upright position whenever eating or drinking.  Take small bites - just a teaspoon size bite at a time.  Eat slowly.  It may also help to eat only one food at a time.  Consider nibbling through smaller, more frequent meals & avoid the urge to eat BIG meals  Do not push through feelings of fullness, nausea, or bloatedness  Do not mix solid foods and liquids in the same mouthful  Try not to "wash foods down" with large gulps of liquids.  Avoid carbonated (bubbly/fizzy) drinks.    Avoid foods that make you feel gassy or bloated.  Start with bland foods first.  Wait on trying greasy, fried, or spicy meals until you are tolerating more bland solids well.  Understand that it will be hard to burp and belch at first.  This gradually improves with time.  Expect to be more gassy/flatulent/bloated initially.  Walking will help your body manage it better.  Consider using medications for bloating that contain simethicone such as  Maalox or Gas-X   Consider crushing her medications, especially smaller pills.  The ability to swallow pills should get easier after a few weeks  Eat in a relaxed atmosphere & minimize distractions.  Avoid talking while eating.    Do not use straws.  Following each meal, sit in an upright position (90 degree angle) for 60 to 90 minutes.  Going for a short walk can help as well  If food does stick, don't panic.  Try to relax and let the food pass on its own.    Sipping WARM LIQUID such as strong hot black tea can also help slide it down.   Be gradual in changes & use common sense:  -If you easily tolerating a certain "level" of foods, advance to the next level gradually -If you are having trouble swallowing a particular food, then avoid it.   -If food is sticking when you advance your diet, go back to thinner previous diet (the lower LEVEL) for 1-2 days.  LEVEL 1 = PUREED DIET  Do for the first 2 WEEKS AFTER SURGERY  -Foods in this group are  pureed or blenderized to a smooth, mashed potato-like consistency.  -If necessary, the pureed foods can keep their shape with the addition of a thickening agent.   -Meat should be pureed to a smooth, pasty consistency.  Hot broth or gravy may be added to the pureed meat, approximately 1 oz. of liquid per 3 oz. serving of meat. -CAUTION:  If any foods do not puree into a smooth consistency, swallowing will be more difficult.  (For example, nuts or seeds sometimes do not blend well.)  Hot Foods Cold Foods  Pureed scrambled eggs and cheese Pureed cottage cheese  Baby cereals Thickened juices and nectars  Thinned cooked cereals (no lumps) Thickened milk or eggnog  Pureed French toast or pancakes Ensure  Mashed potatoes Ice cream  Pureed parsley, au gratin, scalloped potatoes, candied sweet potatoes Fruit or Italian ice, sherbet  Pureed buttered or alfredo noodles Plain yogurt  Pureed vegetables (no corn or peas) Instant breakfast  Pureed soups and creamed soups Smooth pudding, mousse, custard  Pureed scalloped apples Whipped gelatin  Gravies Sugar, syrup, honey, jelly  Sauces, cheese, tomato, barbecue, white, creamed Cream  Any baby food Creamer  Alcohol in moderation (not beer or champagne) Margarine  Coffee or tea Mayonnaise   Ketchup, mustard   Apple sauce   SAMPLE MENU:  PUREED DIET Breakfast Lunch Dinner   Orange juice, 1/2 cup  Cream of wheat, 1/2 cup  Pineapple juice, 1/2 cup  Pureed turkey, barley soup, 3/4 cup  Pureed Hawaiian chicken, 3 oz   Scrambled eggs, mashed or blended with cheese, 1/2 cup  Tea or coffee, 1 cup   Whole milk, 1 cup   Non-dairy creamer, 2 Tbsp.  Mashed potatoes, 1/2 cup  Pureed cooled broccoli, 1/2 cup  Apple sauce, 1/2 cup  Coffee or tea  Mashed potatoes, 1/2 cup  Pureed spinach, 1/2 cup  Frozen yogurt, 1/2 cup  Tea or coffee      LEVEL 2 = SOFT DIET  After your first 2 weeks, you can advance to a soft diet.   Keep on this  diet until everything goes down easily.  Hot Foods Cold Foods  White fish Cottage cheese  Stuffed fish Junior baby fruit  Baby food meals Semi thickened juices  Minced soft cooked, scrambled, poached eggs nectars  Souffle & omelets Ripe mashed bananas  Cooked cereals Canned fruit, pineapple sauce, milk  potatoes Milkshake  Buttered or Alfredo noodles Custard  Cooked cooled vegetable Puddings, including tapioca  Sherbet Yogurt  Vegetable soup or alphabet soup Fruit ice, Italian ice  Gravies Whipped gelatin  Sugar, syrup, honey, jelly Junior baby desserts  Sauces:  Cheese, creamed, barbecue, tomato, white Cream  Coffee or tea Margarine   SAMPLE MENU:  LEVEL 2 Breakfast Lunch Dinner   Orange juice, 1/2 cup  Oatmeal, 1/2 cup  Scrambled eggs with cheese, 1/2 cup  Decaffeinated tea, 1 cup  Whole milk, 1 cup    Non-dairy creamer, 2 Tbsp  Pineapple juice, 1/2 cup  Minced beef, 3 oz  Gravy, 2 Tbsp  Mashed potatoes, 1/2 cup  Minced fresh broccoli, 1/2 cup  Applesauce, 1/2 cup  Coffee, 1 cup  Turkey, barley soup, 3/4 cup  Minced Hawaiian chicken, 3 oz  Mashed potatoes, 1/2 cup  Cooked spinach, 1/2 cup  Frozen yogurt, 1/2 cup  Non-dairy creamer, 2 Tbsp      LEVEL 3 = CHOPPED DIET  -After all the foods in level 2 (soft diet) are passing through well you should advance up to more chopped foods.  -It is still important to cut these foods into small pieces and eat slowly.  Hot Foods Cold Foods  Poultry Cottage cheese  Chopped Swedish meatballs Yogurt  Meat salads (ground or flaked meat) Milk  Flaked fish (tuna) Milkshakes  Poached or scrambled eggs Soft, cold, dry cereal  Souffles and omelets Fruit juices or nectars  Cooked cereals Chopped canned fruit  Chopped French toast or pancakes Canned fruit cocktail  Noodles or pasta (no rice) Pudding, mousse, custard  Cooked vegetables (no frozen peas, corn, or mixed vegetables) Green salad  Canned small sweet peas  Ice cream  Creamed soup or vegetable soup Fruit ice, Italian ice  Pureed vegetable soup or alphabet soup Non-dairy creamer  Ground scalloped apples Margarine  Gravies Mayonnaise  Sauces:  Cheese, creamed, barbecue, tomato, white Ketchup  Coffee or tea Mustard   SAMPLE MENU:  LEVEL 3 Breakfast Lunch Dinner   Orange juice, 1/2 cup  Oatmeal, 1/2 cup  Scrambled eggs with cheese, 1/2 cup  Decaffeinated tea, 1 cup  Whole milk, 1 cup  Non-dairy creamer, 2 Tbsp  Ketchup, 1 Tbsp  Margarine, 1 tsp  Salt, 1/4 tsp  Sugar, 2 tsp  Pineapple juice, 1/2 cup  Ground beef, 3 oz  Gravy, 2 Tbsp  Mashed potatoes, 1/2 cup  Cooked spinach, 1/2 cup  Applesauce, 1/2 cup  Decaffeinated coffee  Whole milk  Non-dairy creamer, 2 Tbsp  Margarine, 1 tsp  Salt, 1/4 tsp  Pureed turkey, barley soup, 3/4 cup  Barbecue chicken, 3 oz  Mashed potatoes, 1/2 cup  Ground fresh broccoli, 1/2 cup  Frozen yogurt, 1/2 cup  Decaffeinated tea, 1 cup  Non-dairy creamer, 2 Tbsp  Margarine, 1 tsp  Salt, 1/4 tsp  Sugar, 1 tsp    LEVEL 4:  REGULAR FOODS  -Foods in this group are soft, moist, regularly textured foods.   -This level includes meat and breads, which tend to be the hardest things to swallow.   -Eat very slowly, chew well and continue to avoid carbonated drinks. -most people are at this level in 4-6 weeks  Hot Foods Cold Foods  Baked fish or skinned Soft cheeses - cottage cheese  Souffles and omelets Cream cheese  Eggs Yogurt  Stuffed shells Milk  Spaghetti with meat sauce Milkshakes  Cooked cereal Cold dry cereals (no nuts, dried fruit, coconut)  French toast or pancakes Crackers  Buttered toast Fruit juices or nectars  Noodles or pasta (no rice) Canned fruit  Potatoes (all types) Ripe bananas  Soft, cooked vegetables (no corn, lima, or baked beans) Peeled, ripe, fresh fruit  Creamed soups or vegetable soup Cakes (no nuts, dried fruit, coconut)  Canned chicken  noodle soup Plain doughnuts  Gravies Ice cream  Bacon dressing Pudding, mousse, custard  Sauces:  Cheese, creamed, barbecue, tomato, white Fruit ice, Italian ice, sherbet  Decaffeinated tea or coffee Whipped gelatin  Pork chops Regular gelatin     Canned fruited gelatin molds   Sugar, syrup, honey, jam, jelly   Cream   Non-dairy   Margarine   Oil   Mayonnaise   Ketchup   Mustard   TROUBLESHOOTING IRREGULAR BOWELS  1) Avoid extremes of bowel movements (no bad constipation/diarrhea)  2) Miralax 17gm mixed in 8oz. water or juice-daily. May use BID as needed.  3) Gas-x,Phazyme, etc. as needed for gas & bloating.  4) Soft,bland diet. No spicy,greasy,fried foods.  5) Prilosec over-the-counter as needed  6) May hold gluten/wheat products from diet to see if symptoms improve.  7) May try probiotics (Align, Activa, etc) to help calm the bowels down  7) If symptoms become worse call back immediately.    If you have any questions please call our office at [facility]: [phone].  

## 2020-12-04 NOTE — Plan of Care (Signed)
Patient and spouse was given discharged instructions per MD order. Patient and spouse stated understanding of instructions given. Patient has an Appointment with MD in 2 weeks

## 2020-12-04 NOTE — Discharge Summary (Signed)
Physician Discharge Summary  Patient ID: Jacob Hanna MRN: [id number] DOB/AGE: January 14, 1961 60 y.o.  Admit date: 12/03/2020 Discharge date: 12/04/2020  Admission Diagnoses: Status post hiatal hernia repair  Discharge Diagnoses:  Active Problems:   S/P Nissen fundoplication (without gastrostomy tube) procedure   Discharged Condition: good  Hospital Course: Patient underwent robotic hiatal hernia repair in fundoplication.  Patient did well postoperatively.  Patient underwent esophagram on postop day 1.  Showed no leak.  He was started on a liquid diet.  He tolerated this well.  Otherwise patient was ambulating well on his own.  He had good pain control.  Patient was deemed stable for discharge and discharged home.   Consults: None  Significant Diagnostic Studies: as above  Treatments: surgery: as above  Discharge Exam: Blood pressure 117/63, pulse 60, temperature 98.2 F (36.8 C), temperature source Oral, resp. rate 18, height 5\' 10"  (1.778 m), weight 97.5 kg, SpO2 97 %. General appearance: alert and cooperative GI: soft, non-tender; bowel sounds normal; no masses,  no organomegaly  Disposition: Discharge disposition: 01-Home or Self Care       Discharge Instructions    Increase activity slowly   Complete by: As directed      Allergies as of 12/04/2020   No Known Allergies     Medication List    TAKE these medications   albuterol 108 (90 Base) MCG/ACT inhaler Commonly known as: VENTOLIN HFA Inhale 2 puffs into the lungs every 4 (four) hours as needed for wheezing or shortness of breath.   CINNAMON PO Take 2,000 mg by mouth daily.   Dulera 100-5 MCG/ACT Aero Generic drug: mometasone-formoterol Inhale 2 puffs BID. Rinse after use What changed:   how much to take  how to take this  when to take this  reasons to take this  additional instructions   ferrous sulfate 325 (65 FE) MG tablet Take 325 mg by mouth daily.   Fish Oil 1000 MG Caps Take  2,000 mg by mouth daily.   guaiFENesin 600 MG 12 hr tablet Commonly known as: MUCINEX Take 600 mg by mouth 2 (two) times daily as needed for to loosen phlegm or cough.   HYDROcodone-acetaminophen 7.5-325 mg/15 ml solution Commonly known as: HYCET Take 15 mLs by mouth 4 (four) times daily as needed for moderate pain.   ibuprofen 200 MG tablet Commonly known as: ADVIL Take 400 mg by mouth every 6 (six) hours as needed for headache.   multivitamin capsule Take 2 capsules by mouth daily.   pseudoephedrine 30 MG tablet Commonly known as: SUDAFED Take 30 mg by mouth every 4 (four) hours as needed for congestion.   vitamin B-12 500 MCG tablet Commonly known as: CYANOCOBALAMIN Take 1,000 mcg by mouth 2 (two) times a week.        Signed: 03-09-1976 12/04/2020, 11:16 AM

## 2021-03-13 ENCOUNTER — Other Ambulatory Visit: Payer: Self-pay | Admitting: Family Medicine

## 2021-03-14 NOTE — Telephone Encounter (Signed)
This +2 refills 

## 2021-04-24 ENCOUNTER — Telehealth: Payer: Self-pay | Admitting: Family Medicine

## 2021-04-24 NOTE — Telephone Encounter (Signed)
Patient has physical 8/5 and needing labs done

## 2021-04-24 NOTE — Telephone Encounter (Signed)
Last labs 05/31/20 ferritin, psa, a1c, lipid, liver, cbc, bmp

## 2021-04-25 ENCOUNTER — Other Ambulatory Visit: Payer: Self-pay

## 2021-04-25 DIAGNOSIS — Z125 Encounter for screening for malignant neoplasm of prostate: Secondary | ICD-10-CM

## 2021-04-25 DIAGNOSIS — Z0000 Encounter for general adult medical examination without abnormal findings: Secondary | ICD-10-CM

## 2021-04-25 DIAGNOSIS — E119 Type 2 diabetes mellitus without complications: Secondary | ICD-10-CM

## 2021-04-25 DIAGNOSIS — D509 Iron deficiency anemia, unspecified: Secondary | ICD-10-CM

## 2021-04-25 DIAGNOSIS — E785 Hyperlipidemia, unspecified: Secondary | ICD-10-CM

## 2021-04-25 NOTE — Telephone Encounter (Signed)
Patient informed labs are ordered.

## 2021-04-25 NOTE — Telephone Encounter (Signed)
Recommend PSA, CBC, ferritin, CMP, lipid, A1c,  Prediabetes, history of iron deficient anemia, history of prediabetes, wellness

## 2021-05-31 ENCOUNTER — Encounter: Payer: Self-pay | Admitting: Family Medicine

## 2021-06-11 LAB — CBC WITH DIFFERENTIAL/PLATELET
Basophils Absolute: 0.1 10*3/uL (ref 0.0–0.2)
Basos: 1 %
EOS (ABSOLUTE): 0.2 10*3/uL (ref 0.0–0.4)
Eos: 5 %
Hematocrit: 44.3 % (ref 37.5–51.0)
Hemoglobin: 15.4 g/dL (ref 13.0–17.7)
Immature Grans (Abs): 0 10*3/uL (ref 0.0–0.1)
Immature Granulocytes: 0 %
Lymphocytes Absolute: 2.1 10*3/uL (ref 0.7–3.1)
Lymphs: 40 %
MCH: 32.2 pg (ref 26.6–33.0)
MCHC: 34.8 g/dL (ref 31.5–35.7)
MCV: 93 fL (ref 79–97)
Monocytes Absolute: 0.5 10*3/uL (ref 0.1–0.9)
Monocytes: 10 %
Neutrophils Absolute: 2.3 10*3/uL (ref 1.4–7.0)
Neutrophils: 44 %
Platelets: 271 10*3/uL (ref 150–450)
RBC: 4.78 x10E6/uL (ref 4.14–5.80)
RDW: 12.6 % (ref 11.6–15.4)
WBC: 5.1 10*3/uL (ref 3.4–10.8)

## 2021-06-11 LAB — HEMOGLOBIN A1C
Est. average glucose Bld gHb Est-mCnc: 117 mg/dL
Hgb A1c MFr Bld: 5.7 % — ABNORMAL HIGH (ref 4.8–5.6)

## 2021-06-11 LAB — COMPREHENSIVE METABOLIC PANEL
ALT: 8 IU/L (ref 0–44)
AST: 20 IU/L (ref 0–40)
Albumin/Globulin Ratio: 1.8 (ref 1.2–2.2)
Albumin: 4.8 g/dL (ref 3.8–4.9)
Alkaline Phosphatase: 143 IU/L — ABNORMAL HIGH (ref 44–121)
BUN/Creatinine Ratio: 16 (ref 9–20)
BUN: 17 mg/dL (ref 6–24)
Bilirubin Total: 0.5 mg/dL (ref 0.0–1.2)
CO2: 23 mmol/L (ref 20–29)
Calcium: 9.8 mg/dL (ref 8.7–10.2)
Chloride: 103 mmol/L (ref 96–106)
Creatinine, Ser: 1.05 mg/dL (ref 0.76–1.27)
Globulin, Total: 2.7 g/dL (ref 1.5–4.5)
Glucose: 109 mg/dL — ABNORMAL HIGH (ref 65–99)
Potassium: 5 mmol/L (ref 3.5–5.2)
Sodium: 143 mmol/L (ref 134–144)
Total Protein: 7.5 g/dL (ref 6.0–8.5)
eGFR: 82 mL/min/{1.73_m2} (ref >59)

## 2021-06-11 LAB — LIPID PANEL
Chol/HDL Ratio: 4.9 ratio (ref 0.0–5.0)
Cholesterol, Total: 209 mg/dL — ABNORMAL HIGH (ref 100–199)
HDL: 43 mg/dL (ref >39)
LDL Chol Calc (NIH): 147 mg/dL — ABNORMAL HIGH (ref 0–99)
Triglycerides: 104 mg/dL (ref 0–149)
VLDL Cholesterol Cal: 19 mg/dL (ref 5–40)

## 2021-06-11 LAB — FERRITIN: Ferritin: 91 ng/mL (ref 30–400)

## 2021-06-11 LAB — PSA: Prostate Specific Ag, Serum: 0.8 ng/mL (ref 0.0–4.0)

## 2021-06-17 ENCOUNTER — Ambulatory Visit (INDEPENDENT_AMBULATORY_CARE_PROVIDER_SITE_OTHER): Payer: Managed Care, Other (non HMO) | Admitting: Family Medicine

## 2021-06-17 ENCOUNTER — Other Ambulatory Visit: Payer: Self-pay

## 2021-06-17 VITALS — BP 112/84 | HR 83 | Ht 69.0 in | Wt 214.4 lb

## 2021-06-17 DIAGNOSIS — Z0000 Encounter for general adult medical examination without abnormal findings: Secondary | ICD-10-CM

## 2021-06-17 DIAGNOSIS — E785 Hyperlipidemia, unspecified: Secondary | ICD-10-CM

## 2021-06-17 DIAGNOSIS — J441 Chronic obstructive pulmonary disease with (acute) exacerbation: Secondary | ICD-10-CM

## 2021-06-17 DIAGNOSIS — Z0001 Encounter for general adult medical examination with abnormal findings: Secondary | ICD-10-CM

## 2021-06-17 DIAGNOSIS — Z79899 Other long term (current) drug therapy: Secondary | ICD-10-CM | POA: Diagnosis not present

## 2021-06-17 NOTE — Patient Instructions (Addendum)
Shingrix and shingles prevention: know the facts!   Shingrix is a very effective vaccine to prevent shingles.   Shingles is a reactivation of chickenpox -more than 99% of Americans born before 1980 have had chickenpox even if they do not remember it. One in every 10 people who get shingles have severe long-lasting nerve pain as a result.   33 out of a 100 older adults will get shingles if they are unvaccinated.     This vaccine is very important for your health This vaccine is indicated for anyone 50 years or older. You can get this vaccine even if you have already had shingles because you can get the disease more than once in a lifetime.  Your risk for shingles and its complications increases with age.  This vaccine has 2 doses.  The second dose would be 2 to 6 months after the first dose.  If you had Zostavax vaccine in the past you should still get Shingrix. ( Zostavax is only 70% effective and it loses significant strength over a few years .)  This vaccine is given through the pharmacy.  The cost of the vaccine is through your insurance. The pharmacy can inform you of the total costs.  Common side effects including soreness in the arm, some redness and swelling, also some feel fatigue muscle soreness headache low-grade fever.  Side effects typically go away within 2 to 3 days. Remember-the pain from shingles can last a lifetime but these side effects of the vaccine will only last a few days at most. It is very important to get both doses in order to protect yourself fully.   Please get this vaccine at your earliest convenience at your trusted pharmacy.        High Cholesterol  High cholesterol is a condition in which the blood has high levels of a white, waxy substance similar to fat (cholesterol). The liver makes all the cholesterol that the body needs. The human body needs small amounts of cholesterol to help build cells. A person gets extra orexcess cholesterol from the  food that he or she eats. The blood carries cholesterol from the liver to the rest of the body. If you have high cholesterol, deposits (plaques) may build up on the walls of your arteries. Arteries are the blood vessels that carry blood away from your heart. These plaques make the arteries narrowand stiff. Cholesterol plaques increase your risk for heart attack and stroke. Work withyour health care provider to keep your cholesterol levels in a healthy range. What increases the risk? The following factors may make you more likely to develop this condition: Eating foods that are high in animal fat (saturated fat) or cholesterol. Being overweight. Not getting enough exercise. A family history of high cholesterol (familial hypercholesterolemia). Use of tobacco products. Having diabetes. What are the signs or symptoms? There are no symptoms of this condition. How is this diagnosed? This condition may be diagnosed based on the results of a blood test. If you are older than 59 years of age, your health care provider may check your cholesterol levels every 4-6 years. You may be checked more often if you have high cholesterol or other risk factors for heart disease. The blood test for cholesterol measures: "Bad" cholesterol, or LDL cholesterol. This is the main type of cholesterol that causes heart disease. The desired level is less than 100 mg/dL. "Good" cholesterol, or HDL cholesterol. HDL helps protect against heart disease by cleaning the arteries and carrying the LDL  to the liver for processing. The desired level for HDL is 60 mg/dL or higher. Triglycerides. These are fats that your body can store or burn for energy. The desired level is less than 150 mg/dL. Total cholesterol. This measures the total amount of cholesterol in your blood and includes LDL, HDL, and triglycerides. The desired level is less than 200 mg/dL. How is this treated? This condition may be treated with: Diet changes. You may  be asked to eat foods that have more fiber and less saturated fats or added sugar. Lifestyle changes. These may include regular exercise, maintaining a healthy weight, and quitting use of tobacco products. Medicines. These are given when diet and lifestyle changes have not worked. You may be prescribed a statin medicine to help lower your cholesterol levels. Follow these instructions at home: Eating and drinking  Eat a healthy, balanced diet. This diet includes: Daily servings of a variety of fresh, frozen, or canned fruits and vegetables. Daily servings of whole grain foods that are rich in fiber. Foods that are low in saturated fats and trans fats. These include poultry and fish without skin, lean cuts of meat, and low-fat dairy products. A variety of fish, especially oily fish that contain omega-3 fatty acids. Aim to eat fish at least 2 times a week. Avoid foods and drinks that have added sugar. Use healthy cooking methods, such as roasting, grilling, broiling, baking, poaching, steaming, and stir-frying. Do not fry your food except for stir-frying.  Lifestyle  Get regular exercise. Aim to exercise for a total of 150 minutes a week. Increase your activity level by doing activities such as gardening, walking, and taking the stairs. Do not use any products that contain nicotine or tobacco, such as cigarettes, e-cigarettes, and chewing tobacco. If you need help quitting, ask your health care provider.  General instructions Take over-the-counter and prescription medicines only as told by your health care provider. Keep all follow-up visits as told by your health care provider. This is important. Where to find more information American Heart Association: www.heart.org National Heart, Lung, and Blood Institute: [employer]Ste[employer].is Contact a health care provider if: You have trouble achieving or maintaining a healthy diet or weight. You are starting an exercise program. You are unable to stop  smoking. Get help right away if: You have chest pain. You have trouble breathing. You have any symptoms of a stroke. "BE FAST" is an easy way to remember the main warning signs of a stroke: B - Balance. Signs are dizziness, sudden trouble walking, or loss of balance. E - Eyes. Signs are trouble seeing or a sudden change in vision. F - Face. Signs are sudden weakness or numbness of the face, or the face or eyelid drooping on one side. A - Arms. Signs are weakness or numbness in an arm. This happens suddenly and usually on one side of the body. S - Speech. Signs are sudden trouble speaking, slurred speech, or trouble understanding what people say. T - Time. Time to call emergency services. Write down what time symptoms started. You have other signs of a stroke, such as: A sudden, severe headache with no known cause. Nausea or vomiting. Seizure. These symptoms may represent a serious problem that is an emergency. Do not wait to see if the symptoms will go away. Get medical help right away. Call your local emergency services (911 in the [place] Do not drive yourself to the hospital. Summary Cholesterol plaques increase your risk for heart attack and stroke. Work with your  health care provider to keep your cholesterol levels in a healthy range. Eat a healthy, balanced diet, get regular exercise, and maintain a healthy weight. Do not use any products that contain nicotine or tobacco, such as cigarettes, e-cigarettes, and chewing tobacco. Get help right away if you have any symptoms of a stroke. This information is not intended to replace advice given to you by your health care provider. Make sure you discuss any questions you have with your healthcare provider. Document Revised: 09/25/2019 Document Reviewed: 09/25/2019 Elsevier Patient Education  2022 [employer].

## 2021-06-17 NOTE — Progress Notes (Signed)
   Subjective:    Patient ID: Jacob Hanna, male    DOB: 1961/04/08, 60 y.o.   MRN: [id number]  HPI The patient comes in today for a wellness visit.  Patient with a history of hyperlipidemia At recent lab work shows slight elevation of glucose Healthy diet regular activity discussed Patient has been having fairly good success of losing weight Patient is safety conscious  A review of their health history was completed.  A review of medications was also completed.  Any needed refills; No  Eating habits: try to eat healthy  Falls/  MVA accidents in past few months: no  Regular exercise: yard work- Scientist, forensic pt sees on regular basis: no  Preventative health issues were discussed.   Additional concerns: none    Review of Systems     Objective:   Physical Exam General-in no acute distress Eyes-no discharge Lungs-respiratory rate normal, CTA CV-no murmurs,RRR Extremities skin warm dry no edema Neuro grossly normal Behavior normal, alert  Prostate exam normal Labs reviewed      Assessment & Plan:  Adult wellness-complete.wellness physical was conducted today. Importance of diet and exercise were discussed in detail.  In addition to this a discussion regarding safety was also covered. We also reviewed over immunizations and gave recommendations regarding current immunization needed for age.  In addition to this additional areas were also touched on including: Preventative health exams needed:  Colonoscopy 2023  Patient was advised yearly wellness exam  Hyperlipidemia-patient would like to try to get this better on his own we will recheck this again in 4 months if his risk is 7.5% or greater on follow-up he will need to start a statin  Patient has been successful in losing weight continue to watch diet stay active  Ferritin is stable CBC stable no sign of iron deficiency  Patient went through a recent [employer] fundoplication surgery and is doing  well  Slight elevation of alkaline phosphatase we will recheck this again in 4 months  Shingrix vaccine recommended COVID booster recommended  Prediabetes no sign of diabetes healthy diet recommended avoid sugary sweets and snacks

## 2021-10-09 LAB — LIPID PANEL
Chol/HDL Ratio: 4.3 ratio (ref 0.0–5.0)
Cholesterol, Total: 185 mg/dL (ref 100–199)
HDL: 43 mg/dL (ref >39)
LDL Chol Calc (NIH): 126 mg/dL — ABNORMAL HIGH (ref 0–99)
Triglycerides: 85 mg/dL (ref 0–149)
VLDL Cholesterol Cal: 16 mg/dL (ref 5–40)

## 2021-10-09 LAB — HEPATIC FUNCTION PANEL
ALT: 18 IU/L (ref 0–44)
AST: 28 IU/L (ref 0–40)
Albumin: 4.7 g/dL (ref 3.8–4.9)
Alkaline Phosphatase: 119 IU/L (ref 44–121)
Bilirubin Total: 0.7 mg/dL (ref 0.0–1.2)
Bilirubin, Direct: 0.17 mg/dL (ref 0.00–0.40)
Total Protein: 7 g/dL (ref 6.0–8.5)

## 2021-10-17 ENCOUNTER — Encounter: Payer: Self-pay | Admitting: Family Medicine

## 2021-10-17 ENCOUNTER — Other Ambulatory Visit: Payer: Self-pay

## 2021-10-17 ENCOUNTER — Ambulatory Visit (INDEPENDENT_AMBULATORY_CARE_PROVIDER_SITE_OTHER): Payer: Managed Care, Other (non HMO) | Admitting: Family Medicine

## 2021-10-17 VITALS — BP 132/86 | HR 62 | Temp 98.1°F | Ht 69.0 in | Wt 221.0 lb

## 2021-10-17 DIAGNOSIS — E785 Hyperlipidemia, unspecified: Secondary | ICD-10-CM

## 2021-10-17 DIAGNOSIS — Z23 Encounter for immunization: Secondary | ICD-10-CM

## 2021-10-17 MED ORDER — DULERA 100-5 MCG/ACT IN AERO: INHALATION_SPRAY | RESPIRATORY_TRACT | 2 refills | Status: DC

## 2021-10-17 MED ORDER — ROSUVASTATIN CALCIUM 10 MG PO TABS: 10.0000 mg | ORAL_TABLET | Freq: Every day | ORAL | 2 refills | Status: DC

## 2021-10-17 NOTE — Patient Instructions (Signed)

## 2021-10-17 NOTE — Progress Notes (Signed)
   Subjective:    Patient ID: Jacob Hanna, male    DOB: 04-Apr-1961, 60 y.o.   MRN: [id number]  HPI  4 month follow up hyperlipidemia, lab results Patient presents today having follow-up on hyperlipidemia.  He is doing the best he can with dietary measures He has brought his numbers down His wrist score is still elevated He is increased risk of coronary artery disease He is trying to fit in some exercise  Review of Systems     Objective:   Physical Exam  General-in no acute distress Eyes-no discharge Lungs-respiratory rate normal, CTA CV-no murmurs,RRR Extremities skin warm dry no edema Neuro grossly normal Behavior normal, alert  The 10-year ASCVD risk score (Arnett DK, et al., 2019) is: 16.1%   Values used to calculate the score:     Age: 60 years     Sex: Male     Is Non-Hispanic African American: No     Diabetic: Yes     Tobacco smoker: No     Systolic Blood Pressure: 132 mmHg     Is BP treated: No     HDL Cholesterol: 43 mg/dL     Total Cholesterol: 185 mg/dL      Assessment & Plan:  Hyperlipidemia Increased risk Benefit and risk of medication discussed Patient is open to starting medication Crestor 10 mg daily Check lab work in 10 to 12 weeks If myalgias arthralgias or other troubles notify us follow-up Warning signs discussed in detail

## 2021-11-20 ENCOUNTER — Other Ambulatory Visit: Payer: Self-pay

## 2021-11-20 ENCOUNTER — Ambulatory Visit (INDEPENDENT_AMBULATORY_CARE_PROVIDER_SITE_OTHER): Payer: Managed Care, Other (non HMO) | Admitting: Family Medicine

## 2021-11-20 ENCOUNTER — Telehealth: Payer: Self-pay | Admitting: Family Medicine

## 2021-11-20 DIAGNOSIS — R0789 Other chest pain: Secondary | ICD-10-CM | POA: Diagnosis not present

## 2021-11-20 DIAGNOSIS — U071 COVID-19: Secondary | ICD-10-CM | POA: Diagnosis not present

## 2021-11-20 MED ORDER — NIRMATRELVIR/RITONAVIR (PAXLOVID)TABLET: 3.0000 | ORAL_TABLET | Freq: Two times a day (BID) | ORAL | 0 refills | Status: AC

## 2021-11-20 NOTE — Progress Notes (Signed)
° °  Subjective:    Patient ID: Jacob Hanna, male    DOB: 1961-08-20, 61 y.o.   MRN: [id number]  HPI Pt tested positive for COVID last night. Pt having chills, headache, dizziness, sore throat and some shortness of breath earlier. Pt took Albuterol and Dulera and that did help.  Patient relates some intermittent chest pain He also relates pain with coughing States he had difficulty breathing last night but not having that currently Using his steroid inhaler twice daily Albuterol as needed  Virtual Visit via Telephone Note  I connected with Jacob Hanna on 11/20/21 at 11:00 AM EST by telephone and verified that I am speaking with the correct person using two identifiers.  Location: Patient: home Provider: office   I discussed the limitations, risks, security and privacy concerns of performing an evaluation and management service by telephone and the availability of in person appointments. I also discussed with the patient that there may be a patient responsible charge related to this service. The patient expressed understanding and agreed to proceed.   History of Present Illness:    Observations/Objective:   Assessment and Plan:   Follow Up Instructions:    I discussed the assessment and treatment plan with the patient. The patient was provided an opportunity to ask questions and all were answered. The patient agreed with the plan and demonstrated an understanding of the instructions.   The patient was advised to call back or seek an in-person evaluation if the symptoms worsen or if the condition fails to improve as anticipated.  I provided  15 minutes of non-face-to-face time during this encounter.   Then the patient was brought in for face-to-face because of this atypical chest pain and the risk of myocarditis associated with COVID we went ahead and did an EKG.  EKG did not show any acute ST segment changes.   Review of Systems     Objective:   Physical  Exam Initially was phone visit  Patient had HEENT benign Lungs are clear Subjective chest discomfort Coarse cough noted        Assessment & Plan:  Chest pain associated with coughing Uses inhalers as directed Paxlovid over 5 days No statin Warning signs discussed Follow-up if ongoing troubles Recheck if any problems

## 2021-11-20 NOTE — Telephone Encounter (Signed)
Mr. Jacob Hanna, Jacob Hanna are scheduled for a virtual visit with your provider today.    Just as we do with appointments in the office, we must obtain your consent to participate.  Your consent will be active for this visit and any virtual visit you may have with one of our providers in the next 365 days.    If you have a MyChart account, I can also send a copy of this consent to you electronically.  All virtual visits are billed to your insurance company just like a traditional visit in the office.  As this is a virtual visit, video technology does not allow for your provider to perform a traditional examination.  This may limit your provider's ability to fully assess your condition.  If your provider identifies any concerns that need to be evaluated in person or the need to arrange testing such as labs, EKG, etc, we will make arrangements to do so.    Although advances in technology are sophisticated, we cannot ensure that it will always work on either your end or our end.  If the connection with a video visit is poor, we may have to switch to a telephone visit.  With either a video or telephone visit, we are not always able to ensure that we have a secure connection.   I need to obtain your verbal consent now.   Are you willing to proceed with your visit today?   Jacob Hanna has provided verbal consent on 11/20/2021 for a virtual visit (video or telephone).   Marlowe Shores, LPN 9/76/7341  93:79 AM

## 2021-12-17 ENCOUNTER — Other Ambulatory Visit: Payer: Self-pay | Admitting: *Deleted

## 2021-12-17 MED ORDER — ROSUVASTATIN CALCIUM 10 MG PO TABS: 10.0000 mg | ORAL_TABLET | Freq: Every day | ORAL | 0 refills | Status: DC

## 2022-05-18 ENCOUNTER — Other Ambulatory Visit: Payer: Self-pay | Admitting: *Deleted

## 2022-05-18 ENCOUNTER — Telehealth: Payer: Self-pay | Admitting: Family Medicine

## 2022-05-18 DIAGNOSIS — D509 Iron deficiency anemia, unspecified: Secondary | ICD-10-CM

## 2022-05-18 DIAGNOSIS — Z0000 Encounter for general adult medical examination without abnormal findings: Secondary | ICD-10-CM

## 2022-05-18 DIAGNOSIS — E785 Hyperlipidemia, unspecified: Secondary | ICD-10-CM

## 2022-05-18 DIAGNOSIS — Z79899 Other long term (current) drug therapy: Secondary | ICD-10-CM

## 2022-05-18 DIAGNOSIS — Z125 Encounter for screening for malignant neoplasm of prostate: Secondary | ICD-10-CM

## 2022-05-18 NOTE — Telephone Encounter (Signed)
Lipid, liver, metabolic 7, A1c, ferritin, CBC, PSA Iron deficient anemia, hyperlipidemia, prediabetes, wellness

## 2022-05-18 NOTE — Telephone Encounter (Signed)
Patient is scheduled for a physical 06/25/22 , please advise lab orders

## 2022-05-18 NOTE — Telephone Encounter (Signed)
Patient needing labs done for physical on 8/17

## 2022-05-18 NOTE — Telephone Encounter (Signed)
Last labs 09/2021- Lipid and Liver ?

## 2022-05-18 NOTE — Telephone Encounter (Signed)
Labs ordered in epic...LMTRC ? ?

## 2022-05-18 NOTE — Telephone Encounter (Signed)
Patient returned call and has been informed of lab orders.

## 2022-06-13 LAB — CBC WITH DIFFERENTIAL/PLATELET
Basophils Absolute: 0 10*3/uL (ref 0.0–0.2)
Basos: 1 %
EOS (ABSOLUTE): 0.2 10*3/uL (ref 0.0–0.4)
Eos: 3 %
Hematocrit: 43.2 % (ref 37.5–51.0)
Hemoglobin: 15.2 g/dL (ref 13.0–17.7)
Immature Grans (Abs): 0 10*3/uL (ref 0.0–0.1)
Immature Granulocytes: 0 %
Lymphocytes Absolute: 2.1 10*3/uL (ref 0.7–3.1)
Lymphs: 40 %
MCH: 31.8 pg (ref 26.6–33.0)
MCHC: 35.2 g/dL (ref 31.5–35.7)
MCV: 90 fL (ref 79–97)
Monocytes Absolute: 0.5 10*3/uL (ref 0.1–0.9)
Monocytes: 9 %
Neutrophils Absolute: 2.5 10*3/uL (ref 1.4–7.0)
Neutrophils: 47 %
Platelets: 245 10*3/uL (ref 150–450)
RBC: 4.78 x10E6/uL (ref 4.14–5.80)
RDW: 12.4 % (ref 11.6–15.4)
WBC: 5.2 10*3/uL (ref 3.4–10.8)

## 2022-06-13 LAB — BASIC METABOLIC PANEL
BUN/Creatinine Ratio: 16 (ref 9–20)
BUN: 17 mg/dL (ref 6–24)
CO2: 24 mmol/L (ref 20–29)
Calcium: 9.5 mg/dL (ref 8.7–10.2)
Chloride: 102 mmol/L (ref 96–106)
Creatinine, Ser: 1.04 mg/dL (ref 0.76–1.27)
Glucose: 106 mg/dL — ABNORMAL HIGH (ref 70–99)
Potassium: 4.7 mmol/L (ref 3.5–5.2)
Sodium: 141 mmol/L (ref 134–144)
eGFR: 83 mL/min/{1.73_m2} (ref >59)

## 2022-06-13 LAB — HEPATIC FUNCTION PANEL
ALT: 16 IU/L (ref 0–44)
AST: 23 IU/L (ref 0–40)
Albumin: 5 g/dL — ABNORMAL HIGH (ref 3.8–4.9)
Alkaline Phosphatase: 130 IU/L — ABNORMAL HIGH (ref 44–121)
Bilirubin Total: 0.5 mg/dL (ref 0.0–1.2)
Bilirubin, Direct: 0.14 mg/dL (ref 0.00–0.40)
Total Protein: 7.4 g/dL (ref 6.0–8.5)

## 2022-06-13 LAB — LIPID PANEL
Chol/HDL Ratio: 3.8 ratio (ref 0.0–5.0)
Cholesterol, Total: 158 mg/dL (ref 100–199)
HDL: 42 mg/dL (ref >39)
LDL Chol Calc (NIH): 95 mg/dL (ref 0–99)
Triglycerides: 117 mg/dL (ref 0–149)
VLDL Cholesterol Cal: 21 mg/dL (ref 5–40)

## 2022-06-13 LAB — FERRITIN: Ferritin: 146 ng/mL (ref 30–400)

## 2022-06-13 LAB — PSA: Prostate Specific Ag, Serum: 0.6 ng/mL (ref 0.0–4.0)

## 2022-06-13 LAB — HEMOGLOBIN A1C
Est. average glucose Bld gHb Est-mCnc: 128 mg/dL
Hgb A1c MFr Bld: 6.1 % — ABNORMAL HIGH (ref 4.8–5.6)

## 2022-06-25 ENCOUNTER — Encounter: Payer: Self-pay | Admitting: Family Medicine

## 2022-06-25 ENCOUNTER — Ambulatory Visit (INDEPENDENT_AMBULATORY_CARE_PROVIDER_SITE_OTHER): Payer: Managed Care, Other (non HMO) | Admitting: Family Medicine

## 2022-06-25 VITALS — BP 128/78 | HR 73 | Ht 68.5 in | Wt 232.6 lb

## 2022-06-25 DIAGNOSIS — E785 Hyperlipidemia, unspecified: Secondary | ICD-10-CM

## 2022-06-25 DIAGNOSIS — Z0000 Encounter for general adult medical examination without abnormal findings: Secondary | ICD-10-CM | POA: Diagnosis not present

## 2022-06-25 MED ORDER — ROSUVASTATIN CALCIUM 10 MG PO TABS: 10.0000 mg | ORAL_TABLET | Freq: Every day | ORAL | 3 refills | Status: DC

## 2022-06-25 NOTE — Patient Instructions (Signed)

## 2022-06-25 NOTE — Progress Notes (Signed)
   Subjective:    Patient ID: Jacob Hanna, male    DOB: 1961-05-26, 61 y.o.   MRN: [id number]  HPI The patient comes in today for a wellness visit.  Patient states she is staying very active with a lot of walking within his work and staying active at home and active with the scouts He is trying to watch his diet but he does admit to drinking soda as he states he will improve We did review his lab work today in detail A review of their health history was completed.  A review of medications was also completed.  Any needed refills; Crestor (90 day supply)  Eating habits: Good  Falls/  MVA accidents in past few months: none  Regular exercise: walking, scouting activities and yard work   Barrister's clerk pt sees on regular basis: none  Preventative health issues were discussed.   Additional concerns:   He denies any chest pain tightness or shortness of breath  Review of Systems     Objective:   Physical Exam  General-in no acute distress Eyes-no discharge Lungs-respiratory rate normal, CTA CV-no murmurs,RRR Extremities skin warm dry no edema Neuro grossly normal Behavior normal, alert   Prostate exam normal    Assessment & Plan:  1. Well adult exam Adult wellness-complete.wellness physical was conducted today. Importance of diet and exercise were discussed in detail.  Importance of stress reduction and healthy living were discussed.  In addition to this a discussion regarding safety was also covered.  We also reviewed over immunizations and gave recommendations regarding current immunization needed for age.   In addition to this additional areas were also touched on including: Preventative health exams needed:  Colonoscopy referral to Dr. Levon Hedger he is due this year  Patient was advised yearly wellness exam   2. Hyperlipidemia, unspecified hyperlipidemia type Refills given follow-up 6 to 9 months

## 2022-06-26 ENCOUNTER — Other Ambulatory Visit: Payer: Self-pay | Admitting: *Deleted

## 2022-06-26 DIAGNOSIS — Z1211 Encounter for screening for malignant neoplasm of colon: Secondary | ICD-10-CM

## 2022-06-26 NOTE — Progress Notes (Signed)
Referral ordered in EPIC. 

## 2022-06-29 ENCOUNTER — Encounter (INDEPENDENT_AMBULATORY_CARE_PROVIDER_SITE_OTHER): Payer: Self-pay | Admitting: *Deleted

## 2022-07-29 ENCOUNTER — Other Ambulatory Visit (INDEPENDENT_AMBULATORY_CARE_PROVIDER_SITE_OTHER): Payer: Self-pay

## 2022-07-29 DIAGNOSIS — Z1211 Encounter for screening for malignant neoplasm of colon: Secondary | ICD-10-CM

## 2022-08-17 ENCOUNTER — Telehealth (INDEPENDENT_AMBULATORY_CARE_PROVIDER_SITE_OTHER): Payer: Self-pay

## 2022-08-17 ENCOUNTER — Encounter (INDEPENDENT_AMBULATORY_CARE_PROVIDER_SITE_OTHER): Payer: Self-pay

## 2022-08-17 MED ORDER — PEG 3350-KCL-NA BICARB-NACL 420 G PO SOLR: 4000.0000 mL | ORAL | 0 refills | Status: DC

## 2022-08-17 NOTE — Telephone Encounter (Signed)
Referring MD/PCP: Dr Sallee Lange  Procedure: Tcs  Reason/Indication:  Screening   Has patient had this procedure before?  Yes   If so, when, by whom and where?  10 years ago  Is there a family history of colon cancer?  No   Who?  What age when diagnosed?    Is patient diabetic? If yes, Type 1 or Type 2   No       Does patient have prosthetic heart valve or mechanical valve?  No   Do you have a pacemaker/defibrillator?  No  Has patient ever had endocarditis/atrial fibrillation? No   Does patient use oxygen? No   Has patient had joint replacement within last 12 months?  No   Is patient constipated or do they take laxatives? No   Does patient have a history of alcohol/drug use?  No   Have you had a stroke/heart attack last 6 mths? NO   Do you take medicine for weight loss?  No   For male patients,: have you had a hysterectomy n/a                      are you post menopausal n/a                      do you still have your menstrual cycle n/a  Is patient on blood thinner such as Coumadin, Plavix and/or Aspirin? No   Medications: rosuvastatin 10 mg daily, cinnamon 1000 mg daily, Vit B12 2500 daily, Fish Oil 2400 2 daily, Dulera 100 mg /5 2 puffs daily  Allergies: NKDA  Medication Adjustment per Dr Jenetta Downer none   Procedure date & time: 09/16/22 at 11:05

## 2022-08-17 NOTE — Telephone Encounter (Signed)
Jacob Hanna, CMA  ?

## 2022-09-08 ENCOUNTER — Telehealth (INDEPENDENT_AMBULATORY_CARE_PROVIDER_SITE_OTHER): Payer: Self-pay

## 2022-09-08 MED ORDER — PEG 3350-KCL-NA BICARB-NACL 420 G PO SOLR: 4000.0000 mL | ORAL | 0 refills | Status: DC

## 2022-09-08 NOTE — Telephone Encounter (Signed)
Avelyn Touch Ann Dequavious Harshberger, CMA  ?

## 2022-09-16 ENCOUNTER — Ambulatory Visit (HOSPITAL_COMMUNITY): Payer: Managed Care, Other (non HMO) | Admitting: Anesthesiology

## 2022-09-16 ENCOUNTER — Encounter (INDEPENDENT_AMBULATORY_CARE_PROVIDER_SITE_OTHER): Payer: Self-pay | Admitting: *Deleted

## 2022-09-16 ENCOUNTER — Encounter (HOSPITAL_COMMUNITY)
Admission: RE | Disposition: A | Discharge status: Home / Self Care | Payer: Self-pay | Source: Home / Self Care | Attending: Gastroenterology

## 2022-09-16 ENCOUNTER — Other Ambulatory Visit: Payer: Self-pay

## 2022-09-16 ENCOUNTER — Ambulatory Visit (HOSPITAL_COMMUNITY)
Admission: RE | Admit: 2022-09-16 | Discharge: 2022-09-16 | Disposition: A | Discharge status: Home / Self Care | Payer: Managed Care, Other (non HMO) | Attending: Gastroenterology | Admitting: Gastroenterology

## 2022-09-16 ENCOUNTER — Ambulatory Visit (HOSPITAL_BASED_OUTPATIENT_CLINIC_OR_DEPARTMENT_OTHER): Payer: Managed Care, Other (non HMO) | Admitting: Anesthesiology

## 2022-09-16 ENCOUNTER — Encounter (HOSPITAL_COMMUNITY): Payer: Self-pay | Admitting: Gastroenterology

## 2022-09-16 DIAGNOSIS — M1990 Unspecified osteoarthritis, unspecified site: Secondary | ICD-10-CM | POA: Diagnosis not present

## 2022-09-16 DIAGNOSIS — Z1211 Encounter for screening for malignant neoplasm of colon: Secondary | ICD-10-CM | POA: Diagnosis present

## 2022-09-16 DIAGNOSIS — K219 Gastro-esophageal reflux disease without esophagitis: Secondary | ICD-10-CM | POA: Diagnosis not present

## 2022-09-16 DIAGNOSIS — K5730 Diverticulosis of large intestine without perforation or abscess without bleeding: Secondary | ICD-10-CM

## 2022-09-16 DIAGNOSIS — D509 Iron deficiency anemia, unspecified: Secondary | ICD-10-CM | POA: Insufficient documentation

## 2022-09-16 DIAGNOSIS — K449 Diaphragmatic hernia without obstruction or gangrene: Secondary | ICD-10-CM | POA: Diagnosis not present

## 2022-09-16 DIAGNOSIS — K648 Other hemorrhoids: Secondary | ICD-10-CM | POA: Diagnosis not present

## 2022-09-16 DIAGNOSIS — J449 Chronic obstructive pulmonary disease, unspecified: Secondary | ICD-10-CM | POA: Diagnosis not present

## 2022-09-16 DIAGNOSIS — J4489 Other specified chronic obstructive pulmonary disease: Secondary | ICD-10-CM | POA: Insufficient documentation

## 2022-09-16 HISTORY — PX: COLONOSCOPY WITH PROPOFOL: SHX5780

## 2022-09-16 LAB — HM COLONOSCOPY

## 2022-09-16 SURGERY — COLONOSCOPY WITH PROPOFOL
Anesthesia: General

## 2022-09-16 MED ORDER — PROPOFOL 10 MG/ML IV BOLUS
INTRAVENOUS | Status: DC | PRN
  Administered 2022-09-16: 20 mg via INTRAVENOUS
  Administered 2022-09-16: 40 mg via INTRAVENOUS
  Administered 2022-09-16: 20 mg via INTRAVENOUS

## 2022-09-16 MED ORDER — PROPOFOL 10 MG/ML IV BOLUS
INTRAVENOUS | Status: AC
  Filled 2022-09-16: qty 20

## 2022-09-16 MED ORDER — PROPOFOL 1000 MG/100ML IV EMUL
INTRAVENOUS | Status: AC
  Filled 2022-09-16: qty 100

## 2022-09-16 MED ORDER — LACTATED RINGERS IV SOLN: INTRAVENOUS | Status: DC

## 2022-09-16 MED ORDER — LIDOCAINE HCL (CARDIAC) PF 100 MG/5ML IV SOSY
PREFILLED_SYRINGE | INTRAVENOUS | Status: DC | PRN
  Administered 2022-09-16: 50 mg via INTRAVENOUS

## 2022-09-16 MED ORDER — STERILE WATER FOR IRRIGATION IR SOLN
Status: DC | PRN
  Administered 2022-09-16: 100 mL

## 2022-09-16 MED ORDER — PROPOFOL 500 MG/50ML IV EMUL
INTRAVENOUS | Status: DC | PRN
  Administered 2022-09-16: 150 ug/kg/min via INTRAVENOUS

## 2022-09-16 NOTE — Anesthesia Preprocedure Evaluation (Addendum)
Anesthesia Evaluation  Patient identified by MRN, date of birth, ID band Patient awake    Reviewed: Allergy & Precautions, H&P , NPO status , Patient's Chart, lab work & pertinent test results  Airway Mallampati: II  TM Distance: >3 FB Neck ROM: Full    Dental  (+) Dental Advisory Given, Caps   Pulmonary asthma , sleep apnea , COPD,  COPD inhaler   Pulmonary exam normal breath sounds clear to auscultation       Cardiovascular negative cardio ROS Normal cardiovascular exam Rhythm:Regular Rate:Normal     Neuro/Psych  Neuromuscular disease  negative psych ROS   GI/Hepatic Neg liver ROS, hiatal hernia,GERD (hiatal hernia repair - 11/2020)  Controlled,,  Endo/Other  negative endocrine ROS    Renal/GU negative Renal ROS  negative genitourinary   Musculoskeletal  (+) Arthritis , Osteoarthritis,    Abdominal   Peds negative pediatric ROS (+)  Hematology  (+) Blood dyscrasia, anemia   Anesthesia Other Findings   Reproductive/Obstetrics negative OB ROS                             Anesthesia Physical Anesthesia Plan  ASA: 2  Anesthesia Plan: General   Post-op Pain Management: Minimal or no pain anticipated   Induction: Intravenous  PONV Risk Score and Plan:   Airway Management Planned: Nasal Cannula and Natural Airway  Additional Equipment:   Intra-op Plan:   Post-operative Plan:   Informed Consent: I have reviewed the patients History and Physical, chart, labs and discussed the procedure including the risks, benefits and alternatives for the proposed anesthesia with the patient or authorized representative who has indicated his/her understanding and acceptance.     Dental advisory given  Plan Discussed with: CRNA and Surgeon  Anesthesia Plan Comments:        Anesthesia Quick Evaluation

## 2022-09-16 NOTE — Anesthesia Postprocedure Evaluation (Signed)
Anesthesia Post Note  Patient: Jacob Hanna  Procedure(s) Performed: COLONOSCOPY WITH PROPOFOL  Patient location during evaluation: Phase II Anesthesia Type: General Level of consciousness: awake and alert and oriented Pain management: pain level controlled Vital Signs Assessment: post-procedure vital signs reviewed and stable Respiratory status: spontaneous breathing, nonlabored ventilation and respiratory function stable Cardiovascular status: blood pressure returned to baseline and stable Postop Assessment: no apparent nausea or vomiting Anesthetic complications: no  No notable events documented.   Last Vitals:  Vitals:   09/16/22 0937 09/16/22 1124  BP: (!) 152/94 111/68  Pulse: 69 76  Resp: 14 16  Temp: 36.6 C 36.4 C  SpO2: 98%     Last Pain:  Vitals:   09/16/22 1129  TempSrc:   PainSc: 0-No pain                 Tiersa Dayley C Maelys Kinnick

## 2022-09-16 NOTE — Anesthesia Procedure Notes (Signed)
Procedure Name: MAC Date/Time: 09/16/2022 11:00 AM  Performed by: Lieutenant Diego, CRNAPre-anesthesia Checklist: Patient identified, Emergency Drugs available, Suction available, Patient being monitored and Timeout performed Patient Re-evaluated:Patient Re-evaluated prior to induction Oxygen Delivery Method: Nasal cannula Preoxygenation: Pre-oxygenation with 100% oxygen Induction Type: IV induction

## 2022-09-16 NOTE — Discharge Instructions (Signed)
You are being discharged to home.  Resume your previous diet.  Your physician has recommended a repeat colonoscopy in 10 years for screening purposes.  

## 2022-09-16 NOTE — H&P (Signed)
Jacob Hanna is an 61 y.o. male.   Chief Complaint: screening colonoscopy HPI: 61 y/o M with PMH COPD, GERD, iron deficiency anemia, arthritis, hiatal hernia, coming for screening colonoscopy. T last colonoscopy was performed 10 years ago, no polyps were found.  The patient denies having any complaints such as melena, hematochezia, abdominal pain or distention, change in her bowel movement consistency or frequency, no changes in weight recently.  No family history of colorectal cancer.   Past Medical History:  Diagnosis Date   Allergy to environmental factors    Blood dyscrasia    GI bleed several yrs. ago   COPD (chronic obstructive pulmonary disease) (HCC)    Exercise-induced asthma    GERD (gastroesophageal reflux disease)    History of hiatal hernia    IDA (iron deficiency anemia)    Mild obstructive sleep apnea    per study 03-27-2013 (in epic)  no cpap recommended   OA (osteoarthritis)    left knee    Past Surgical History:  Procedure Laterality Date   BIOPSY  10/16/2020   Procedure: BIOPSY;  Surgeon: Dolores Frame, MD;  Location: [facility];  Service: Gastroenterology;;   CARDIOVASCULAR STRESS TEST  07/26/2017   Low risk nuclear study w/ no ischemia/  normal LV function and wall motion, nuclear stress ef 60%   CHONDROPLASTY Left 04/14/2013   Procedure: CHONDROPLASTY;  Surgeon: Vickki Hearing, MD;  Location: [facility];  Service: Orthopedics;  Laterality: Left;   COLONOSCOPY     ESOPHAGEAL MANOMETRY N/A 10/04/2020   Procedure: ESOPHAGEAL MANOMETRY (EM);  Surgeon: Napoleon Form, MD;  Location: [facility];  Service: Endoscopy;  Laterality: N/A;   ESOPHAGOGASTRODUODENOSCOPY     ESOPHAGOGASTRODUODENOSCOPY (EGD) WITH PROPOFOL N/A 10/16/2020   Procedure: ESOPHAGOGASTRODUODENOSCOPY (EGD) WITH PROPOFOL;  Surgeon: Dolores Frame, MD;  Location: [facility];  Service: Gastroenterology;  Laterality: N/A;  11:45   GIVENS CAPSULE STUDY  09/07/2012    Procedure: GIVENS CAPSULE STUDY;  Surgeon: Malissa Hippo, MD;  Location: [facility];  Service: Endoscopy;  Laterality: N/A;  730   GIVENS CAPSULE STUDY N/A 06/23/2016   Procedure: GIVENS CAPSULE STUDY;  Surgeon: Malissa Hippo, MD;  Location: [facility];  Service: Endoscopy;  Laterality: N/A;  730   INSERTION OF MESH N/A 12/03/2020   Procedure: INSERTION OF MESH;  Surgeon: Axel Filler, MD;  Location: [facility];  Service: General;  Laterality: N/A;   KNEE ARTHROSCOPY Left 06/ 2019  @[facility]    KNEE ARTHROSCOPY WITH EXCISION PLICA Left 01/31/2013   Procedure: KNEE ARTHROSCOPY WITH EXCISION PLICA;  Surgeon: 02/02/2013, MD;  Location: [facility];  Service: Orthopedics;  Laterality: Left;   KNEE ARTHROSCOPY WITH MEDIAL MENISECTOMY Left 01/31/2013   Procedure: KNEE ARTHROSCOPY WITH PARTIAL  MEDIAL MENISECTOMY;  Surgeon: 02/02/2013, MD;  Location: [facility];  Service: Orthopedics;  Laterality: Left;   KNEE ARTHROSCOPY WITH MEDIAL MENISECTOMY Left 04/14/2013   Procedure: KNEE ARTHROSCOPY WITH PARTIAL MEDIAL MENISECTOMY;  Surgeon: 06/14/2013, MD;  Location: [facility];  Service: Orthopedics;  Laterality: Left;  Partial medial menisectomy   TOTAL KNEE ARTHROPLASTY Left 09/12/2018   Procedure: LEFT TOTAL KNEE ARTHROPLASTY;  Surgeon: 13/02/2018, MD;  Location: [facility];  Service: Orthopedics;  Laterality: Left;  Adductor Block   XI ROBOTIC ASSISTED HIATAL HERNIA REPAIR N/A 12/03/2020   Procedure: XI ROBOTIC ASSISTED HIATAL HERNIA REPAIR WITH MESH AND FUNDOPLICATION;  Surgeon: 12/05/2020, MD;  Location: [facility];  Service: General;  Laterality: N/A;    Family History  Problem Relation Age of Onset   Heart attack Father        Pacemaker   Diabetes Sister    Diabetes Sister    Colon cancer Neg Hx    Social History:  reports that he has never smoked. He has never used smokeless tobacco. He reports that he does not drink alcohol and does not use drugs.  Allergies: No Known  Allergies  Medications Prior to Admission  Medication Sig Dispense Refill   CINNAMON PO Take 2,000 mg by mouth daily.     ferrous sulfate 325 (65 FE) MG tablet Take 325 mg by mouth every other day.     guaiFENesin (MUCINEX) 600 MG 12 hr tablet Take 600 mg by mouth 2 (two) times daily as needed for to loosen phlegm or cough.     ibuprofen (ADVIL) 200 MG tablet Take 400 mg by mouth every 6 (six) hours as needed for headache.     mometasone-formoterol (DULERA) 100-5 MCG/ACT AERO INHALE 2 PUFFS BY MOUTH TWICE DAILY RINSE MOUTH AFTER USE (Patient taking differently: Inhale 2 puffs into the lungs 2 (two) times daily as needed for wheezing or shortness of breath. INHALE 2 PUFFS BY MOUTH TWICE DAILY RINSE MOUTH AFTER USE) 39 g 2   Omega-3 Fatty Acids (FISH OIL) 1000 MG CAPS Take 2,000 mg by mouth daily.     polyethylene glycol-electrolytes (TRILYTE) 420 g solution Take 4,000 mLs by mouth as directed. 4000 mL 0   rosuvastatin (CRESTOR) 10 MG tablet Take 1 tablet (10 mg total) by mouth daily. 90 tablet 3   vitamin B-12 (CYANOCOBALAMIN) 500 MCG tablet Take 1,000 mcg by mouth 2 (two) times a week.     albuterol (VENTOLIN HFA) 108 (90 Base) MCG/ACT inhaler Inhale 2 puffs into the lungs every 4 (four) hours as needed for wheezing or shortness of breath. 18 g 4   pseudoephedrine (SUDAFED) 30 MG tablet Take 30 mg by mouth every 4 (four) hours as needed for congestion.      No results found for this or any previous visit (from the past 48 hour(s)). No results found.  Review of Systems  All other systems reviewed and are negative.   Blood pressure (!) 152/94, pulse 69, temperature 97.9 F (36.6 C), temperature source Oral, resp. rate 14, height 5\' 10"  (1.778 m), weight 106.6 kg, SpO2 98 %. Physical Exam  GENERAL: The patient is AO x3, in no acute distress. HEENT: Head is normocephalic and atraumatic. EOMI are intact. Mouth is well hydrated and without lesions. NECK: Supple. No masses LUNGS: Clear to  auscultation. No presence of rhonchi/wheezing/rales. Adequate chest expansion HEART: RRR, normal s1 and s2. ABDOMEN: Soft, nontender, no guarding, no peritoneal signs, and nondistended. BS +. No masses. EXTREMITIES: Without any cyanosis, clubbing, rash, lesions or edema. NEUROLOGIC: AOx3, no focal motor deficit. SKIN: no jaundice, no rashes  Assessment/Plan 61 y/o M with PMH COPD, GERD, iron deficiency anemia, arthritis, hiatal hernia, coming for screening colonoscopy. The patient is at average risk for colorectal cancer.  We will proceed with colonoscopy today.   68, MD 09/16/2022, 9:41 AM

## 2022-09-16 NOTE — Op Note (Signed)
[facility] Patient Name: Jacob Hanna Procedure Date: 09/16/2022 10:45 AM MRN: [id number] Date of Birth: [birth year] Attending MD: Maylon Peppers , , [id number] CSN: [id number] Age: 61 Admit Type: Outpatient Procedure:                Colonoscopy Indications:              Screening for colorectal malignant neoplasm Providers:                Maylon Peppers, Caprice Kluver, Aram Candela Referring MD:              Medicines:                Monitored Anesthesia Care Complications:            No immediate complications. Estimated Blood Loss:     Estimated blood loss: none. Procedure:                Pre-Anesthesia Assessment:                           - Prior to the procedure, a History and Physical                            was performed, and patient medications, allergies                            and sensitivities were reviewed. The patient's                            tolerance of previous anesthesia was reviewed.                           - The risks and benefits of the procedure and the                            sedation options and risks were discussed with the                            patient. All questions were answered and informed                            consent was obtained.                           - ASA Grade Assessment: II - A patient with mild                            systemic disease.                           After obtaining informed consent, the colonoscope                            was passed under direct vision. Throughout the                            procedure, the patient's blood pressure,  pulse, and                            oxygen saturations were monitored continuously. The                            [id number]-[id number] ([id number]) scope was introduced through                            the anus and advanced to the the cecum, identified                            by appendiceal orifice and ileocecal valve. The                            colonoscopy was  performed without difficulty. The                            patient tolerated the procedure well. The quality                            of the bowel preparation was good. Scope In: 10:58:29 AM Scope Out: 11:21:39 AM Scope Withdrawal Time: 0 hours 13 minutes 10 seconds  Total Procedure Duration: 0 hours 23 minutes 10 seconds  Findings:      The perianal and digital rectal examinations were normal.      A few small-mouthed diverticula were found in the sigmoid colon.      Non-bleeding internal hemorrhoids were found during retroflexion. The       hemorrhoids were small. Impression:               - Diverticulosis in the sigmoid colon.                           - Non-bleeding internal hemorrhoids.                           - No specimens collected. Moderate Sedation:      Per Anesthesia Care Recommendation:           - Discharge patient to home (ambulatory).                           - Resume previous diet.                           - Repeat colonoscopy in 10 years for screening                            purposes. Procedure Code(s):        --- Professional ---                           E7035, Colorectal cancer screening; colonoscopy on                            individual not meeting criteria for high risk Diagnosis Code(s):        ---  Professional ---                           Z12.11, Encounter for screening for malignant                            neoplasm of colon                           K64.8, Other hemorrhoids                           K57.30, Diverticulosis of large intestine without                            perforation or abscess without bleeding CPT copyright 2022 American [employer] Association. All rights reserved. The codes documented in this report are preliminary and upon coder review may  be revised to meet current compliance requirements. Maylon Peppers, MD Maylon Peppers,  09/16/2022 11:29:05 AM This report has been signed electronically. Number of Addenda:  0

## 2022-09-16 NOTE — Transfer of Care (Signed)
Immediate Anesthesia Transfer of Care Note  Patient: Jacob Hanna  Procedure(s) Performed: COLONOSCOPY WITH PROPOFOL  Patient Location: Endoscopy Unit  Anesthesia Type:MAC  Level of Consciousness: drowsy  Airway & Oxygen Therapy: Patient Spontanous Breathing and Patient connected to nasal cannula oxygen  Post-op Assessment: Report given to RN and Post -op Vital signs reviewed and stable  Post vital signs: Reviewed and stable  Last Vitals:  Vitals Value Taken Time  BP    Temp    Pulse    Resp    SpO2      Last Pain:  Vitals:   09/16/22 1052  TempSrc:   PainSc: 0-No pain      Patients Stated Pain Goal: 5 (34/74/25 9563)  Complications: No notable events documented.

## 2022-09-23 ENCOUNTER — Encounter (HOSPITAL_COMMUNITY): Payer: Self-pay | Admitting: Gastroenterology

## 2023-06-08 ENCOUNTER — Telehealth: Payer: Self-pay | Admitting: Family Medicine

## 2023-06-08 DIAGNOSIS — D509 Iron deficiency anemia, unspecified: Secondary | ICD-10-CM

## 2023-06-08 DIAGNOSIS — E785 Hyperlipidemia, unspecified: Secondary | ICD-10-CM

## 2023-06-08 DIAGNOSIS — Z125 Encounter for screening for malignant neoplasm of prostate: Secondary | ICD-10-CM

## 2023-06-08 DIAGNOSIS — Z0000 Encounter for general adult medical examination without abnormal findings: Secondary | ICD-10-CM

## 2023-06-08 DIAGNOSIS — Z79899 Other long term (current) drug therapy: Secondary | ICD-10-CM

## 2023-06-08 NOTE — Telephone Encounter (Signed)
Labs ordered in Epic and patient called and verbalized understanding.

## 2023-06-08 NOTE — Telephone Encounter (Signed)
Requesting labs for physical in August

## 2023-06-08 NOTE — Telephone Encounter (Signed)
May have lipid, liver, metabolic 7, PSA, CBC, ferritin, TIBC  Wellness, screening prostate cancer, hyperlipidemia, history of iron deficient anemia

## 2023-06-29 ENCOUNTER — Encounter: Payer: Managed Care, Other (non HMO) | Admitting: Family Medicine

## 2023-07-05 ENCOUNTER — Ambulatory Visit: Payer: Managed Care, Other (non HMO) | Admitting: Nurse Practitioner

## 2023-07-05 VITALS — BP 130/84 | HR 69 | Temp 98.2°F | Ht 70.08 in | Wt 238.6 lb

## 2023-07-05 DIAGNOSIS — R7303 Prediabetes: Secondary | ICD-10-CM | POA: Diagnosis not present

## 2023-07-05 DIAGNOSIS — N528 Other male erectile dysfunction: Secondary | ICD-10-CM

## 2023-07-05 DIAGNOSIS — Z0000 Encounter for general adult medical examination without abnormal findings: Secondary | ICD-10-CM

## 2023-07-05 DIAGNOSIS — Z0001 Encounter for general adult medical examination with abnormal findings: Secondary | ICD-10-CM

## 2023-07-05 NOTE — Progress Notes (Unsigned)
Subjective:    Patient ID: Jacob Hanna, male    DOB: 1961-02-27, 62 y.o.   MRN: [id number]  HPI The patient comes in today for a wellness visit.    A review of their health history was completed.  A review of medications was also completed.  Any needed refills; No  Eating habits: Okay; weight stable; trying to eat healthy  Falls/  MVA accidents in past few months: No  Regular exercise: Yes; stays active  Specialist pt sees on regular basis: No  Preventative health issues were discussed.   Additional concerns: No Same sexual partner Regular vision and dental care Fatigue at times; stated he has had a normal sleep study Requesting Viagra to help with mild ED  Review of Systems  Constitutional:  Positive for fatigue. Negative for activity change and appetite change.  HENT:  Negative for dental problem, sore throat and trouble swallowing.   Respiratory:  Negative for cough, chest tightness, shortness of breath and wheezing.   Cardiovascular:  Negative for chest pain and leg swelling.  Gastrointestinal:  Negative for abdominal distention, abdominal pain, constipation, diarrhea, nausea and vomiting.  Genitourinary:  Negative for difficulty urinating, dysuria, enuresis, frequency, genital sores, penile discharge, penile pain, penile swelling, scrotal swelling, testicular pain and urgency.  Psychiatric/Behavioral:  Negative for dysphoric mood, sleep disturbance and suicidal ideas. The patient is not nervous/anxious.       07/05/2023    9:17 AM  Depression screen PHQ 2/9  Decreased Interest 0  Down, Depressed, Hopeless 0  PHQ - 2 Score 0  Altered sleeping 0  Tired, decreased energy 1  Change in appetite 0  Feeling bad or failure about yourself  0  Trouble concentrating 0  Moving slowly or fidgety/restless 0  Suicidal thoughts 0  PHQ-9 Score 1  Difficult doing work/chores Not difficult at all      07/05/2023    9:17 AM  GAD 7 : Generalized Anxiety Score   Nervous, Anxious, on Edge 0  Control/stop worrying 0  Worry too much - different things 0  Trouble relaxing 0  Restless 0  Easily annoyed or irritable 0  Afraid - awful might happen 0  Total GAD 7 Score 0         Objective:   Physical Exam Vitals and nursing note reviewed.  Constitutional:      General: He is not in acute distress.    Appearance: He is well-developed.  Neck:     Thyroid: No thyromegaly.     Trachea: No tracheal deviation.     Comments: Thyroid non tender to palpation. No mass or goiter noted.  Cardiovascular:     Rate and Rhythm: Normal rate and regular rhythm.     Heart sounds: Normal heart sounds. No murmur heard. Pulmonary:     Effort: Pulmonary effort is normal.     Breath sounds: Normal breath sounds.  Abdominal:     General: There is no distension.     Palpations: Abdomen is soft.     Tenderness: There is no abdominal tenderness.  Genitourinary:    Comments: Defers GU exam; denies any problems.  Musculoskeletal:     Cervical back: Normal range of motion and neck supple.  Lymphadenopathy:     Cervical: No cervical adenopathy.  Skin:    General: Skin is warm and dry.  Neurological:     Mental Status: He is alert and oriented to person, place, and time.     Deep Tendon Reflexes: Reflexes  are normal and symmetric.  Psychiatric:        Mood and Affect: Mood normal.        Behavior: Behavior normal.        Thought Content: Thought content normal.        Judgment: Judgment normal.    Today's Vitals   07/05/23 0915  BP: 130/84  Pulse: 69  Temp: 98.2 F (36.8 C)  SpO2: 96%  Weight: 238 lb 9.6 oz (108.2 kg)  Height: 5' 10.08" (1.78 m)   Body mass index is 34.16 kg/m. Results for orders placed or performed in visit on 06/08/23  Lipid panel  Result Value Ref Range   Cholesterol, Total 108 100 - 199 mg/dL   Triglycerides 97 0 - 149 mg/dL   HDL 43 >52 mg/dL   VLDL Cholesterol Cal 18 5 - 40 mg/dL   LDL Chol Calc (NIH) 47 0 - 99 mg/dL    Chol/HDL Ratio 2.5 0.0 - 5.0 ratio  Hepatic function panel  Result Value Ref Range   Total Protein 6.9 6.0 - 8.5 g/dL   Albumin 4.6 3.8 - 4.9 g/dL   Bilirubin Total 0.5 0.0 - 1.2 mg/dL   Bilirubin, Direct 8.41 0.00 - 0.40 mg/dL   Alkaline Phosphatase 141 (H) 44 - 121 IU/L   AST 19 0 - 40 IU/L   ALT 14 0 - 44 IU/L  Basic Metabolic Panel (7)  Result Value Ref Range   Glucose 116 (H) 70 - 99 mg/dL   BUN 16 8 - 27 mg/dL   Creatinine, Ser 3.24 0.76 - 1.27 mg/dL   eGFR 71 >40 NU/UVO/5.36   BUN/Creatinine Ratio 14 10 - 24   Sodium 143 134 - 144 mmol/L   Potassium 4.8 3.5 - 5.2 mmol/L   Chloride 106 96 - 106 mmol/L   CO2 23 20 - 29 mmol/L  PSA  Result Value Ref Range   Prostate Specific Ag, Serum 0.6 0.0 - 4.0 ng/mL  CBC with Differential  Result Value Ref Range   WBC 5.2 3.4 - 10.8 x10E3/uL   RBC 4.81 4.14 - 5.80 x10E6/uL   Hemoglobin 15.2 13.0 - 17.7 g/dL   Hematocrit 64.4 03.4 - 51.0 %   MCV 94 79 - 97 fL   MCH 31.6 26.6 - 33.0 pg   MCHC 33.7 31.5 - 35.7 g/dL   RDW 74.2 59.5 - 63.8 %   Platelets 264 150 - 450 x10E3/uL   Neutrophils 51 Not Estab. %   Lymphs 35 Not Estab. %   Monocytes 9 Not Estab. %   Eos 4 Not Estab. %   Basos 1 Not Estab. %   Neutrophils Absolute 2.7 1.4 - 7.0 x10E3/uL   Lymphocytes Absolute 1.8 0.7 - 3.1 x10E3/uL   Monocytes Absolute 0.5 0.1 - 0.9 x10E3/uL   EOS (ABSOLUTE) 0.2 0.0 - 0.4 x10E3/uL   Basophils Absolute 0.0 0.0 - 0.2 x10E3/uL   Immature Granulocytes 0 Not Estab. %   Immature Grans (Abs) 0.0 0.0 - 0.1 x10E3/uL  Iron, TIBC and Ferritin Panel  Result Value Ref Range   Total Iron Binding Capacity 390 250 - 450 ug/dL   UIBC 756 433 - 295 ug/dL   Iron 188 38 - 416 ug/dL   Iron Saturation 31 15 - 55 %   Ferritin 176 30 - 400 ng/mL   Reviewed labs with patient during visit.  Slow weight gain since 2022.    Assessment & Plan:   Problem List Items Addressed This Visit  Other   Other male erectile dysfunction   Prediabetes    Other Visit Diagnoses     Wellness examination    -  Primary      Meds ordered this encounter  Medications   sildenafil (REVATIO) 20 MG tablet    Sig: Take one tab po x 1 dose 1-4 hours before sexual activity; max one dose per day    Dispense:  30 tablet    Refill:  0    Order Specific Question:   Supervising Provider    Answer:   Lilyan Punt A [[id number]]   Trial of Viagra as directed.  Discussed diagnosis of prediabetes, including activity and weight loss.  Discussed stress reduction and adequate rest.  Call back if fatigue persists.  Return in about 6 months (around 01/05/2024).

## 2023-07-06 ENCOUNTER — Encounter: Payer: Self-pay | Admitting: Nurse Practitioner

## 2023-07-06 DIAGNOSIS — N528 Other male erectile dysfunction: Secondary | ICD-10-CM | POA: Insufficient documentation

## 2023-07-06 MED ORDER — SILDENAFIL CITRATE 20 MG PO TABS: ORAL_TABLET | ORAL | 0 refills | Status: AC

## 2023-07-19 ENCOUNTER — Other Ambulatory Visit: Payer: Self-pay | Admitting: Family Medicine

## 2023-12-16 ENCOUNTER — Encounter: Payer: Self-pay | Admitting: Family Medicine

## 2023-12-16 ENCOUNTER — Ambulatory Visit (INDEPENDENT_AMBULATORY_CARE_PROVIDER_SITE_OTHER): Payer: Managed Care, Other (non HMO) | Admitting: Family Medicine

## 2023-12-16 VITALS — BP 128/84 | HR 67 | Temp 98.1°F | Ht 70.08 in | Wt 235.0 lb

## 2023-12-16 DIAGNOSIS — D509 Iron deficiency anemia, unspecified: Secondary | ICD-10-CM

## 2023-12-16 DIAGNOSIS — R197 Diarrhea, unspecified: Secondary | ICD-10-CM | POA: Diagnosis not present

## 2023-12-16 DIAGNOSIS — R1011 Right upper quadrant pain: Secondary | ICD-10-CM

## 2023-12-16 NOTE — Addendum Note (Signed)
 Addended by: Charlotta Cook A on: 12/16/2023 02:33 PM   Modules accepted: Level of Service

## 2023-12-16 NOTE — Progress Notes (Signed)
   Subjective:    Patient ID: Jacob Hanna, male    DOB: 26-Dec-1960, 63 y.o.   MRN: [id number]  HPI Since October he has been having loose explosive stools at times very smelly as well as difficult to clean the toilet bowl This been going on multiple times per week and sometimes several times per day Denies any blood in stool Denies any fever or chills No abdominal pains except at times gas pains that radiate up into the upper abdomen Energy level fair at times but more recently has been subpar and fatigued and tired Denies sleep apnea symptoms    Review of Systems     Objective:   Physical Exam General-in no acute distress Eyes-no discharge Lungs-respiratory rate normal, CTA CV-no murmurs,RRR Extremities skin warm dry no edema Neuro grossly normal Behavior normal, alert Abdomen is soft with right upper quadrant tenderness       Assessment & Plan:  1. RUQ pain (Primary) Lab work ordered.  Ultrasound right upper quadrant rule out gallstones - Basic Metabolic Panel - CBC - Hepatic Function Panel - Lipase - US  Abdomen Limited RUQ (LIVER/GB)  2. Diarrhea, unspecified type Lab work ordered more than likely would benefit from gastroenterology consult check labs close could be possibly need sigmoidoscopy to rule out possibility of a colitis issue - Basic Metabolic Panel - CBC - Hepatic Function Panel - Lipase - US  Abdomen Limited RUQ (LIVER/GB)  3. Iron deficiency anemia, unspecified iron deficiency anemia type Has history of iron deficient anemia check labs with him having fatigue - Iron, TIBC and Ferritin Panel

## 2023-12-17 ENCOUNTER — Other Ambulatory Visit: Payer: Self-pay

## 2023-12-17 ENCOUNTER — Encounter: Payer: Self-pay | Admitting: Family Medicine

## 2023-12-17 DIAGNOSIS — R140 Abdominal distension (gaseous): Secondary | ICD-10-CM

## 2023-12-17 DIAGNOSIS — R197 Diarrhea, unspecified: Secondary | ICD-10-CM

## 2023-12-17 LAB — BASIC METABOLIC PANEL
BUN/Creatinine Ratio: 13 (ref 10–24)
BUN: 13 mg/dL (ref 8–27)
CO2: 25 mmol/L (ref 20–29)
Calcium: 9.9 mg/dL (ref 8.6–10.2)
Chloride: 102 mmol/L (ref 96–106)
Creatinine, Ser: 0.98 mg/dL (ref 0.76–1.27)
Glucose: 127 mg/dL — ABNORMAL HIGH (ref 70–99)
Potassium: 4.9 mmol/L (ref 3.5–5.2)
Sodium: 142 mmol/L (ref 134–144)
eGFR: 88 mL/min/{1.73_m2} (ref >59)

## 2023-12-17 LAB — CBC
Hematocrit: 45.5 % (ref 37.5–51.0)
Hemoglobin: 15.8 g/dL (ref 13.0–17.7)
MCH: 32.6 pg (ref 26.6–33.0)
MCHC: 34.7 g/dL (ref 31.5–35.7)
MCV: 94 fL (ref 79–97)
Platelets: 258 10*3/uL (ref 150–450)
RBC: 4.85 x10E6/uL (ref 4.14–5.80)
RDW: 12.8 % (ref 11.6–15.4)
WBC: 5.3 10*3/uL (ref 3.4–10.8)

## 2023-12-17 LAB — IRON,TIBC AND FERRITIN PANEL
Ferritin: 192 ng/mL (ref 30–400)
Iron Saturation: 30 % (ref 15–55)
Iron: 129 ug/dL (ref 38–169)
Total Iron Binding Capacity: 425 ug/dL (ref 250–450)
UIBC: 296 ug/dL (ref 111–343)

## 2023-12-17 LAB — HEPATIC FUNCTION PANEL
ALT: 13 [IU]/L (ref 0–44)
AST: 19 [IU]/L (ref 0–40)
Albumin: 4.8 g/dL (ref 3.9–4.9)
Alkaline Phosphatase: 138 [IU]/L — ABNORMAL HIGH (ref 44–121)
Bilirubin Total: 0.6 mg/dL (ref 0.0–1.2)
Bilirubin, Direct: 0.21 mg/dL (ref 0.00–0.40)
Total Protein: 7.4 g/dL (ref 6.0–8.5)

## 2023-12-17 LAB — LIPASE: Lipase: 40 U/L (ref 13–78)

## 2023-12-24 ENCOUNTER — Ambulatory Visit (HOSPITAL_COMMUNITY)
Admission: RE | Admit: 2023-12-24 | Discharge: 2023-12-24 | Disposition: A | Discharge status: Home / Self Care | Payer: Managed Care, Other (non HMO) | Source: Ambulatory Visit | Attending: Family Medicine | Admitting: Family Medicine

## 2023-12-24 DIAGNOSIS — R197 Diarrhea, unspecified: Secondary | ICD-10-CM | POA: Diagnosis present

## 2023-12-24 DIAGNOSIS — R1011 Right upper quadrant pain: Secondary | ICD-10-CM | POA: Insufficient documentation

## 2023-12-25 ENCOUNTER — Encounter: Payer: Self-pay | Admitting: Family Medicine

## 2023-12-27 ENCOUNTER — Encounter (INDEPENDENT_AMBULATORY_CARE_PROVIDER_SITE_OTHER): Payer: Self-pay | Admitting: *Deleted

## 2024-01-31 ENCOUNTER — Ambulatory Visit (INDEPENDENT_AMBULATORY_CARE_PROVIDER_SITE_OTHER): Payer: Managed Care, Other (non HMO) | Admitting: Gastroenterology

## 2024-01-31 ENCOUNTER — Encounter (INDEPENDENT_AMBULATORY_CARE_PROVIDER_SITE_OTHER): Payer: Self-pay | Admitting: Gastroenterology

## 2024-01-31 VITALS — BP 127/86 | HR 80 | Temp 98.0°F | Ht 70.0 in | Wt 239.4 lb

## 2024-01-31 DIAGNOSIS — R197 Diarrhea, unspecified: Secondary | ICD-10-CM

## 2024-01-31 DIAGNOSIS — R194 Change in bowel habit: Secondary | ICD-10-CM | POA: Insufficient documentation

## 2024-01-31 NOTE — Patient Instructions (Signed)
 Start Benefiber fiber 1-2 tablespoons daily to increase stool bulk Perform blood and stool workup

## 2024-01-31 NOTE — Progress Notes (Signed)
 Katrinka Blazing, M.D. Gastroenterology & Hepatology [facility] [address] [place], [place] [address]  Primary Care Physician: Babs Sciara, MD [address] [place] [address]  I will communicate my assessment and recommendations to the referring MD via EMR.  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  Patient reports that for the last 3 months he has noticed having change in his bowel movements. He has had explosive bowel movements, which may vary in amount. States stools may be watery or fluffy in consistency. He is having 2-6 bowel movements per day. Never constipated. Has felt some bloating. No recent changes in diet. Reports that his wife has had similar symptoms as he has, but maybe milder No fecal soiling or nighttime episodes. The patient denies having any nausea, vomiting, fever, chills, hematochezia, melena, hematemesis, abdominal pain, heartburn, dysphagia, jaundice, pruritus or weight loss.  Does not take any fiber supplements.  Patient reports that he has felt fatigued for the last few months.  Has not performed stool tests yet.  Patient was ordered to have C. difficile testing and GI pathogen panel but never received the cup to collect this sample.  Has not started any new meds or supplements/herbs.  Last EGD: 10/16/20 - 5 cm paraesophageal hernia with a few Cameron erosions. Severely distorted gastric anatomy. - Normal stomach. - Erythematous duodenopathy. Biopsied.  Path:A. DUODENUM, BIOPSY:  - Peptic duodenitis.  - No dysplasia or malignancy.   Last Colonoscopy: 09/16/22 - Diverticulosis in the sigmoid colon. - Non- bleeding  internal hemorrhoids.  Past Medical History: Past Medical History:  Diagnosis Date   Allergy to environmental factors    Blood dyscrasia    GI bleed several yrs. ago   COPD (chronic obstructive pulmonary disease) (HCC)    Exercise-induced asthma    GERD (gastroesophageal reflux disease)    History of hiatal hernia    IDA (iron deficiency anemia)    Mild obstructive sleep apnea    per study 03-27-2013 (in epic)  no cpap recommended   OA (osteoarthritis)    left knee    Past Surgical History: Past Surgical History:  Procedure Laterality Date   BIOPSY  10/16/2020   Procedure: BIOPSY;  Surgeon: Dolores Frame, MD;  Location: [facility];  Service: Gastroenterology;;   CARDIOVASCULAR STRESS TEST  07/26/2017   Low risk nuclear study w/ no ischemia/  normal LV function and wall motion, nuclear stress ef 60%   CHONDROPLASTY Left 04/14/2013   Procedure: CHONDROPLASTY;  Surgeon: Vickki Hearing, MD;  Location: [facility];  Service: Orthopedics;  Laterality: Left;   COLONOSCOPY     COLONOSCOPY WITH PROPOFOL N/A 09/16/2022   Procedure: COLONOSCOPY WITH PROPOFOL;  Surgeon: Dolores Frame, MD;  Location: [facility];  Service: Gastroenterology;  Laterality: N/A;  1105 ASA 2   ESOPHAGEAL MANOMETRY N/A 10/04/2020   Procedure: ESOPHAGEAL MANOMETRY (EM);  Surgeon: Napoleon Form, MD;  Location: [facility];  Service: Endoscopy;  Laterality: N/A;   ESOPHAGOGASTRODUODENOSCOPY     ESOPHAGOGASTRODUODENOSCOPY (EGD) WITH PROPOFOL N/A 10/16/2020   Procedure: ESOPHAGOGASTRODUODENOSCOPY (EGD) WITH PROPOFOL;  Surgeon: Dolores Frame, MD;  [facility]: [facility];  Service: Gastroenterology;  Laterality: N/A;  11:45   GIVENS  CAPSULE STUDY  09/07/2012   Procedure: GIVENS CAPSULE STUDY;  Surgeon: Malissa Hippo, MD;  [facility]: [facility];  Service: Endoscopy;  Laterality: N/A;  730   GIVENS CAPSULE STUDY N/A 06/23/2016   Procedure: GIVENS CAPSULE STUDY;  Surgeon:  Malissa Hippo, MD;  Location: [facility];  Service: Endoscopy;  Laterality: N/A;  730   INSERTION OF MESH N/A 12/03/2020   Procedure: INSERTION OF MESH;  Surgeon: Axel Filler, MD;  Location: [facility] OR;  Service: General;  Laterality: N/A;   KNEE ARTHROSCOPY Left 06/ 2019  @[facility]    KNEE ARTHROSCOPY WITH EXCISION PLICA Left 01/31/2013   Procedure: KNEE ARTHROSCOPY WITH EXCISION PLICA;  Surgeon: Vickki Hearing, MD;  Location: [facility];  Service: Orthopedics;  Laterality: Left;   KNEE ARTHROSCOPY WITH MEDIAL MENISECTOMY Left 01/31/2013   Procedure: KNEE ARTHROSCOPY WITH PARTIAL  MEDIAL MENISECTOMY;  Surgeon: Vickki Hearing, MD;  Location: [facility];  Service: Orthopedics;  Laterality: Left;   KNEE ARTHROSCOPY WITH MEDIAL MENISECTOMY Left 04/14/2013   Procedure: KNEE ARTHROSCOPY WITH PARTIAL MEDIAL MENISECTOMY;  Surgeon: Vickki Hearing, MD;  Location: [facility];  Service: Orthopedics;  Laterality: Left;  Partial medial menisectomy   TOTAL KNEE ARTHROPLASTY Left 09/12/2018   Procedure: LEFT TOTAL KNEE ARTHROPLASTY;  Surgeon: Gean Birchwood, MD;  Location: [facility];  Service: Orthopedics;  Laterality: Left;  Adductor Block   XI ROBOTIC ASSISTED HIATAL HERNIA REPAIR N/A 12/03/2020   Procedure: XI ROBOTIC ASSISTED HIATAL HERNIA REPAIR WITH MESH AND FUNDOPLICATION;  Surgeon: Axel Filler, MD;  Location: [facility];  Service: General;  Laterality: N/A;    Family History: Family History  Problem Relation Age of Onset   Heart attack Father        Pacemaker   Diabetes Sister    Diabetes Sister    Colon cancer Neg Hx     Social History: Social History   Tobacco Use  Smoking Status Never  Smokeless Tobacco Never   Social History   Substance and Sexual Activity  Alcohol Use No   Social History   Substance and Sexual Activity  Drug Use No    Allergies: No Known Allergies  Medications: Current Outpatient Medications  Medication Sig Dispense Refill   albuterol (VENTOLIN HFA) 108 (90 Base)  MCG/ACT inhaler Inhale 2 puffs into the lungs every 4 (four) hours as needed for wheezing or shortness of breath. 18 g 4   CINNAMON PO Take 2,000 mg by mouth daily.     ferrous sulfate 325 (65 FE) MG tablet Take 325 mg by mouth every other day.     guaiFENesin (MUCINEX) 600 MG 12 hr tablet Take 600 mg by mouth 2 (two) times daily as needed for to loosen phlegm or cough.     ibuprofen (ADVIL) 200 MG tablet Take 400 mg by mouth every 6 (six) hours as needed for headache.     mometasone-formoterol (DULERA) 100-5 MCG/ACT AERO INHALE 2 PUFFS BY MOUTH TWICE DAILY RINSE MOUTH AFTER USE (Patient taking differently: Inhale 2 puffs into the lungs 2 (two) times daily as needed for wheezing or shortness of breath. INHALE 2 PUFFS BY MOUTH TWICE DAILY RINSE MOUTH AFTER USE) 39 g 2   Omega-3 Fatty Acids (FISH OIL) 1000 MG CAPS Take 2,000 mg by mouth daily.     pseudoephedrine (SUDAFED) 30 MG tablet Take 30 mg by mouth every 4 (four) hours as needed for congestion.     rosuvastatin (CRESTOR) 10 MG tablet TAKE 1 TABLET DAILY 90  tablet 3   sildenafil (REVATIO) 20 MG tablet Take one tab po x 1 dose 1-4 hours before sexual activity; max one dose per day 30 tablet 0   vitamin B-12 (CYANOCOBALAMIN) 500 MCG tablet Take 1,000 mcg by mouth daily.     No current facility-administered medications for this visit.    Review of Systems: GENERAL: negative for malaise, night sweats HEENT: No changes in hearing or vision, no nose bleeds or other nasal problems. NECK: Negative for lumps, goiter, pain and significant neck swelling RESPIRATORY: Negative for cough, wheezing CARDIOVASCULAR: Negative for chest pain, leg swelling, palpitations, orthopnea GI: SEE HPI MUSCULOSKELETAL: Negative for joint pain or swelling, back pain, and muscle pain. SKIN: Negative for lesions, rash PSYCH: Negative for sleep disturbance, mood disorder and recent psychosocial stressors. HEMATOLOGY Negative for prolonged bleeding, bruising easily, and  swollen nodes. ENDOCRINE: Negative for cold or heat intolerance, polyuria, polydipsia and goiter. NEURO: negative for tremor, gait imbalance, syncope and seizures. The remainder of the review of systems is noncontributory.   Physical Exam: BP 127/86 (BP Location: Left Arm, Patient Position: Sitting, Cuff Size: Large)   Pulse 80   Temp 98 F (36.7 C) (Temporal)   Ht 5\' 10"  (1.778 m)   Wt 239 lb 6.4 oz (108.6 kg)   BMI 34.35 kg/m  GENERAL: The patient is AO x3, in no acute distress. HEENT: Head is normocephalic and atraumatic. EOMI are intact. Mouth is well hydrated and without lesions. NECK: Supple. No masses LUNGS: Clear to auscultation. No presence of rhonchi/wheezing/rales. Adequate chest expansion HEART: RRR, normal s1 and s2. ABDOMEN: Soft, nontender, no guarding, no peritoneal signs, and nondistended. BS +. No masses. EXTREMITIES: Without any cyanosis, clubbing, rash, lesions or edema. NEUROLOGIC: AOx3, no focal motor deficit. SKIN: no jaundice, no rashes  Imaging/Labs: as above  I personally reviewed and interpreted the available labs, imaging and endoscopic files.  Impression and Plan: CHRISOTPHER RIVERO is a 63 y.o. 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  Patient has presented recent change in his bowel movement frequency and consistency.  Notably, his wife has presented similar symptoms although milder in severity.  He has not started any recent medications or supplements.  It is unclear why he has developed the symptoms but we will need to look up for metabolic or infectious etiologies for now.  I advised him to start taking bulking agents to improve his symptoms for now.  -Start Benefiber fiber 1-2 tablespoons daily to increase stool bulk -Check celiac disease panel, TSH, ova and parasite, GI pathogen panel and C. difficile testing  All  questions were answered.      Katrinka Blazing, MD Gastroenterology and Hepatology [facility]

## 2024-02-02 LAB — CELIAC DISEASE PANEL
(tTG) Ab, IgA: <1 U/mL
(tTG) Ab, IgG: <1 U/mL
Gliadin IgA: 3.3 U/mL
Gliadin IgG: 6.7 U/mL
Immunoglobulin A: 219 mg/dL (ref 70–320)

## 2024-02-02 LAB — TSH: TSH: 2.92 m[IU]/L (ref 0.40–4.50)

## 2024-02-10 LAB — GASTROINTESTINAL PATHOGEN PNL
CampyloBacter Group: NOT DETECTED
Norovirus GI/GII: NOT DETECTED
Rotavirus A: NOT DETECTED
Salmonella species: NOT DETECTED
Shiga Toxin 1: NOT DETECTED
Shiga Toxin 2: NOT DETECTED
Shigella Species: NOT DETECTED
Vibrio Group: NOT DETECTED
Yersinia enterocolitica: NOT DETECTED

## 2024-02-10 LAB — C. DIFFICILE GDH AND TOXIN A/B
GDH ANTIGEN: NOT DETECTED
MICRO NUMBER:: 16278473
SPECIMEN QUALITY:: ADEQUATE
TOXIN A AND B: NOT DETECTED

## 2024-02-10 LAB — OVA AND PARASITE EXAMINATION
CONCENTRATE RESULT:: NONE SEEN
MICRO NUMBER:: 16274719
SPECIMEN QUALITY:: ADEQUATE
TRICHROME RESULT:: NONE SEEN

## 2024-02-11 ENCOUNTER — Emergency Department (HOSPITAL_COMMUNITY)
Admission: EM | Admit: 2024-02-11 | Discharge: 2024-02-11 | Disposition: A | Discharge status: Home / Self Care | Payer: Worker's Compensation | Attending: Emergency Medicine | Admitting: Emergency Medicine

## 2024-02-11 ENCOUNTER — Encounter (INDEPENDENT_AMBULATORY_CARE_PROVIDER_SITE_OTHER): Payer: Self-pay

## 2024-02-11 ENCOUNTER — Emergency Department (HOSPITAL_COMMUNITY)

## 2024-02-11 ENCOUNTER — Other Ambulatory Visit: Payer: Self-pay

## 2024-02-11 ENCOUNTER — Encounter (HOSPITAL_COMMUNITY): Payer: Self-pay | Admitting: *Deleted

## 2024-02-11 DIAGNOSIS — M549 Dorsalgia, unspecified: Secondary | ICD-10-CM | POA: Diagnosis present

## 2024-02-11 DIAGNOSIS — M545 Low back pain, unspecified: Secondary | ICD-10-CM

## 2024-02-11 NOTE — ED Provider Notes (Signed)
 [place] EMERGENCY DEPARTMENT AT [facility] Provider Note   CSN: [id number] Arrival date & time: 02/11/24  1231     History  Chief Complaint  Patient presents with   Back Pain    Jacob Hanna is a 63 y.o. male.  Past medical history of arthritis to left knee, iron deficiency anemia, history of GI bleeding, hyperlipidemia, prediabetes presenting to emergency room after fall.  Patient reports that 5 days ago when he was walking he slipped on some oil at work.  He fell directly on to his lower back.  Reports he has been able to ambulate since then.  Was initially evaluated in urgent care on Monday and treated with Robaxin and ibuprofen..  Reports this is temporarily relieving his symptoms.  Went back for recheck today and since he was still having pain urgent care recommended he come here for x-ray.  He reports when he fell he did not hit his head or lose consciousness.  He has no other pain or reported injury.    Back Pain      Home Medications Prior to Admission medications   Medication Sig Start Date End Date Taking? Authorizing Provider  albuterol (VENTOLIN HFA) 108 (90 Base) MCG/ACT inhaler Inhale 2 puffs into the lungs every 4 (four) hours as needed for wheezing or shortness of breath. 11/28/19   Babs Sciara, MD  CINNAMON PO Take 2,000 mg by mouth daily.    [provider]  ferrous sulfate 325 (65 FE) MG tablet Take 325 mg by mouth every other day.    [provider]  guaiFENesin (MUCINEX) 600 MG 12 hr tablet Take 600 mg by mouth 2 (two) times daily as needed for to loosen phlegm or cough.    [provider]  ibuprofen (ADVIL) 200 MG tablet Take 400 mg by mouth every 6 (six) hours as needed for headache.    [provider]  mometasone-formoterol (DULERA) 100-5 MCG/ACT AERO INHALE 2 PUFFS BY MOUTH TWICE DAILY RINSE MOUTH AFTER USE Patient taking differently: Inhale 2 puffs into the lungs 2 (two) times daily as needed for wheezing  or shortness of breath. INHALE 2 PUFFS BY MOUTH TWICE DAILY RINSE MOUTH AFTER USE 10/17/21   Babs Sciara, MD  Omega-3 Fatty Acids (FISH OIL) 1000 MG CAPS Take 2,000 mg by mouth daily.    [provider]  pseudoephedrine (SUDAFED) 30 MG tablet Take 30 mg by mouth every 4 (four) hours as needed for congestion.    [provider]  rosuvastatin (CRESTOR) 10 MG tablet TAKE 1 TABLET DAILY 07/20/23   Babs Sciara, MD  sildenafil (REVATIO) 20 MG tablet Take one tab po x 1 dose 1-4 hours before sexual activity; max one dose per day 07/06/23   Campbell Riches, NP  vitamin B-12 (CYANOCOBALAMIN) 500 MCG tablet Take 1,000 mcg by mouth daily.    [provider]      Allergies    Patient has no known allergies.    Review of Systems   Review of Systems  Musculoskeletal:  Positive for back pain.    Physical Exam Updated Vital Signs BP (!) 156/83   Pulse 84   Temp 98.2 F (36.8 C)   Resp 20   Ht 5\' 10"  (1.778 m)   Wt 107.5 kg   SpO2 95%   BMI 34.01 kg/m  Physical Exam Vitals and nursing note reviewed.  Constitutional:      General: He is not in acute  distress.    Appearance: He is not toxic-appearing.  HENT:     Head: Normocephalic and atraumatic.  Eyes:     General: No scleral icterus.    Conjunctiva/sclera: Conjunctivae normal.  Cardiovascular:     Rate and Rhythm: Normal rate and regular rhythm.     Pulses: Normal pulses.     Heart sounds: Normal heart sounds.  Pulmonary:     Effort: Pulmonary effort is normal. No respiratory distress.     Breath sounds: Normal breath sounds.  Abdominal:     General: Abdomen is flat. Bowel sounds are normal.     Palpations: Abdomen is soft.     Tenderness: There is no abdominal tenderness.  Musculoskeletal:     Right lower leg: No edema.     Left lower leg: No edema.     Comments: Patient has tenderness to palpation over right paraspinal musculature.  No midline tenderness of lumbar spine step-off or deformity.   He has no radicular symptoms.  Strength and sensation of lower extremity equal bilaterally.  Skin:    General: Skin is warm and dry.     Findings: No lesion.  Neurological:     General: No focal deficit present.     Mental Status: He is alert and oriented to person, place, and time. Mental status is at baseline.     ED Results / Procedures / Treatments   Labs (all labs ordered are listed, but only abnormal results are displayed) Labs Reviewed - No data to display  EKG None  Radiology DG Lumbar Spine Complete Result Date: 02/11/2024 CLINICAL DATA:  Low back pain.  Fall. EXAM: LUMBAR SPINE - COMPLETE 4+ VIEW COMPARISON:  12/21/2017. FINDINGS: There are 5 nonrib-bearing lumbar vertebrae. Anatomic lumbar curvature. No spondylolysis or spondylolisthesis. Vertebral body heights are maintained. No aggressive osseous lesion. Mild multilevel degenerative changes in the form of reduced L5-S1 intervertebral disc height, mild multilevel facet arthropathy and marginal osteophyte formation. Sacroiliac joints are symmetric. Visualized soft tissues are within normal limits. IMPRESSION: No acute osseous abnormality of the lumbar spine. Electronically Signed   By: Jules Schick M.D.   On: 02/11/2024 14:42    Procedures Procedures    Medications Ordered in ED Medications - No data to display  ED Course/ Medical Decision Making/ A&P                                 Medical Decision Making Amount and/or Complexity of Data Reviewed Radiology: ordered.   Jacob Hanna 63 y.o. presented today for back pain. Working Ddx: MSK in nature, fracture, epidural hematoma/abscess, cauda equina syndrome, spinal stenosis, spinal malignancy, discitis, spinal infection, spondylitises/ spondylosis, conus medullaris, DDD of the back.  R/o DDx: Cauda equina syndrome and additional dx are less likely than current impression due to history of present illness, physical exam, labs/imaging findings. No focal  neurological deficits, no loss of bowel or bladder control.  Denies fever, night sweats, weight loss, h/o cancer, IVDU.  PMHX: COPD     Imaging:  Lumbar spine -- negative for acute fracture.   Problem List / ED Course / Critical interventions / Medication management  Reporting with 5 days of low back pain.  This occurred after he had an injury.  He has been ambulatory since.  He has no radicular symptoms.  He has no red flag symptoms associated with his back pain.  He is hemodynamically stable and well-appearing.  He has no chest pain or shortness of breath.  No abdominal pain.  Strength and sensation of lower extremities equal bilaterally.  Lumbar spine without any midline tenderness step-off or deformity.  He has no obvious rash or area of pain.  No obvious ecchymosis.  He does have reproducible right lower paraspinal tenderness.  He reports that this is worse with moving side-to-side.  Given acute nature feel x-ray would be most appropriate for initial evaluation of back pain.  Offered pain medication during stay he declines. X-rays negative.  Patient ambulatory steady gait.  Pain is mild and likely anything here during stay.  He has no radicular symptoms and no red flag symptoms thus feel he is stable for discharge and gradual return to activity as tolerated.  Discussed over-the-counter management and return precautions.  He does have appropriate follow-up  Patients vitals assessed. Upon arrival patient is hemodynamically stable.  I have reviewed the patients home medicines and have made adjustments as needed   Plan:  F/u w/ PCP in 2-3d to ensure resolution of sx.  RICE protocol and pain medicine discussed with patient.  Patient was given return precautions. Patient stable for discharge at this time.  Patient educated on sx/dx and verbalized understanding of plan. Return to ER with new or worsening sx.          Final Clinical Impression(s) / ED Diagnoses Final diagnoses:  Acute  bilateral low back pain without sciatica    Rx / DC Orders ED Discharge Orders     None         Smitty Knudsen, PA-C 02/12/24 1324    Bethann Berkshire, MD 02/13/24 1055

## 2024-02-11 NOTE — Discharge Instructions (Signed)
 Take 1000 mg of Tylenol every 6 hours.  You continue taking naproxen as prescribed by urgent care and Robaxin as needed.  Can also use lidocaine patch or Voltaren gel over area of pain.  I would also recommend heat and gentle stretches.  Please follow-up with primary care to ensure improvement and resolution of symptoms.  Return to ER with any new or worsening symptoms.

## 2024-02-11 NOTE — ED Triage Notes (Addendum)
 Pt seen at [facility] for back pain and sent here for xray.  Pt states he hurt his back at work on Monday by a fall. Aching pain but some movements makes pain sharp in nature. Pt denies hitting his head with the fall.

## 2024-02-11 NOTE — Progress Notes (Signed)
 I called and left a message on patient Vm, that I have sent him a My Chart message regarding the results to respond back through this if he has any questions.

## 2024-02-15 ENCOUNTER — Ambulatory Visit: Admitting: Family Medicine

## 2024-04-18 ENCOUNTER — Ambulatory Visit (INDEPENDENT_AMBULATORY_CARE_PROVIDER_SITE_OTHER): Admitting: Gastroenterology

## 2024-04-18 ENCOUNTER — Encounter (INDEPENDENT_AMBULATORY_CARE_PROVIDER_SITE_OTHER): Payer: Self-pay | Admitting: Gastroenterology

## 2024-04-18 VITALS — BP 123/81 | HR 80 | Temp 97.7°F | Ht 70.0 in | Wt 226.8 lb

## 2024-04-18 DIAGNOSIS — R197 Diarrhea, unspecified: Secondary | ICD-10-CM

## 2024-04-18 NOTE — Progress Notes (Unsigned)
 Referring Provider: Bennet Brasil, MD Primary Care Physician:  Bennet Brasil, MD Primary GI Physician: Dr. Sammi Crick   Chief Complaint  Patient presents with   Follow-up    Pt arrives for follow up on Diarrhea. Pt states that diarrhea has not been as bad. Pt will sometimes have issues but not nearly as much as March visit. Pt takes Fiber every 2-3 days.    HPI:   Jacob Hanna is a 63 y.o. 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 2-6 Bms per day.   Recommended to check celiac panel, TSH, O&P, gi pathogen panel, c diff testing, start benefiber  March 2025:  Celiac panel negative, C diff, GI pathogen panel, negative, TSH  2.92   Present:  States diarrhea is much improved. Has it maybe every couple of weeks with a few episodes. He is doing fiber gummies every few days, took this daily but made him go more regularly. No rectal bleeding. No abdominal pain. States stools are dark on iron pill every other day. Having a BM usually once per day, stools mostly formed, sometimes looser. Has been trying to watch what he eats and be more active to help lose weight. Feels more greasy foods may precipitate diarrhea.    Last EGD: 10/16/20 - 5 cm paraesophageal hernia with a few Cameron erosions. Severely distorted gastric anatomy. - Normal stomach. - Erythematous duodenopathy. Biopsied.   Path:A. DUODENUM, BIOPSY:  - Peptic duodenitis.  - No dysplasia or malignancy.    Last Colonoscopy: 09/16/22 - Diverticulosis in the sigmoid colon. - Non- bleeding internal hemorrhoids. Filed Weights   04/18/24 1509  Weight: 226 lb 12.8 oz (102.9 kg)     Past Medical History:  Diagnosis Date   Allergy to environmental factors    Blood dyscrasia    GI bleed several yrs. ago   COPD  (chronic obstructive pulmonary disease) (HCC)    Exercise-induced asthma    GERD (gastroesophageal reflux disease)    History of hiatal hernia    IDA (iron deficiency anemia)    Mild obstructive sleep apnea    per study 03-27-2013 (in epic)  no cpap recommended   OA (osteoarthritis)    left knee    Past Surgical History:  Procedure Laterality Date   BIOPSY  10/16/2020   Procedure: BIOPSY;  Surgeon: Urban Garden, MD;  Location: [facility];  Service: Gastroenterology;;   CARDIOVASCULAR STRESS TEST  07/26/2017   Low risk nuclear study w/ no ischemia/  normal LV function and wall motion, nuclear stress ef 60%   CHONDROPLASTY Left 04/14/2013   Procedure: CHONDROPLASTY;  Surgeon: Darrin Emerald, MD;  Location: [facility];  Service: Orthopedics;  Laterality: Left;   COLONOSCOPY     COLONOSCOPY WITH PROPOFOL  N/A 09/16/2022   Procedure: COLONOSCOPY WITH PROPOFOL ;  Surgeon: Urban Garden, MD;  Location: [facility];  Service: Gastroenterology;  Laterality: N/A;  1105 ASA 2   ESOPHAGEAL MANOMETRY N/A 10/04/2020   Procedure: ESOPHAGEAL MANOMETRY (EM);  Surgeon: Sergio Dandy, MD;  Location: [facility];  Service: Endoscopy;  Laterality: N/A;   ESOPHAGOGASTRODUODENOSCOPY     ESOPHAGOGASTRODUODENOSCOPY (EGD) WITH PROPOFOL  N/A 10/16/2020   Procedure: ESOPHAGOGASTRODUODENOSCOPY (EGD) WITH PROPOFOL ;  Surgeon: Urban Garden, MD;  [facility]: [facility];  Service: Gastroenterology;  Laterality: N/A;  11:45   GIVENS CAPSULE STUDY  09/07/2012   Procedure: GIVENS CAPSULE STUDY;  Surgeon: Ruby Corporal, MD;  Location: [facility];  Service: Endoscopy;  Laterality: N/A;  730   GIVENS CAPSULE STUDY N/A 06/23/2016   Procedure: GIVENS CAPSULE STUDY;  Surgeon: Ruby Corporal, MD;  Location: [facility];  Service: Endoscopy;  Laterality: N/A;  730   INSERTION OF MESH N/A 12/03/2020   Procedure: INSERTION OF MESH;  Surgeon: Shela Derby, MD;  Location: [facility];   Service: General;  Laterality: N/A;   KNEE ARTHROSCOPY Left 06/ 2019  @[facility]    KNEE ARTHROSCOPY WITH EXCISION PLICA Left 01/31/2013   Procedure: KNEE ARTHROSCOPY WITH EXCISION PLICA;  Surgeon: Darrin Emerald, MD;  Location: [facility];  Service: Orthopedics;  Laterality: Left;   KNEE ARTHROSCOPY WITH MEDIAL MENISECTOMY Left 01/31/2013   Procedure: KNEE ARTHROSCOPY WITH PARTIAL  MEDIAL MENISECTOMY;  Surgeon: Darrin Emerald, MD;  Location: [facility];  Service: Orthopedics;  Laterality: Left;   KNEE ARTHROSCOPY WITH MEDIAL MENISECTOMY Left 04/14/2013   Procedure: KNEE ARTHROSCOPY WITH PARTIAL MEDIAL MENISECTOMY;  Surgeon: Darrin Emerald, MD;  Location: [facility];  Service: Orthopedics;  Laterality: Left;  Partial medial menisectomy   TOTAL KNEE ARTHROPLASTY Left 09/12/2018   Procedure: LEFT TOTAL KNEE ARTHROPLASTY;  Surgeon: Wendolyn Hamburger, MD;  Location: [facility];  Service: Orthopedics;  Laterality: Left;  Adductor Block   XI ROBOTIC ASSISTED HIATAL HERNIA REPAIR N/A 12/03/2020   Procedure: XI ROBOTIC ASSISTED HIATAL HERNIA REPAIR WITH MESH AND FUNDOPLICATION;  Surgeon: Shela Derby, MD;  Location: [facility];  Service: General;  Laterality: N/A;    Current Outpatient Medications  Medication Sig Dispense Refill   albuterol  (VENTOLIN  HFA) 108 (90 Base) MCG/ACT inhaler Inhale 2 puffs into the lungs every 4 (four) hours as needed for wheezing or shortness of breath. 18 g 4   CINNAMON PO Take 2,000 mg by mouth daily.     ferrous sulfate  325 (65 FE) MG tablet Take 325 mg by mouth every other day.     guaiFENesin  (MUCINEX ) 600 MG 12 hr tablet Take 600 mg by mouth 2 (two) times daily as needed for to loosen phlegm or cough.     ibuprofen  (ADVIL ) 200 MG tablet Take 400 mg by mouth every 6 (six) hours as needed for headache.     mometasone -formoterol (DULERA ) 100-5 MCG/ACT AERO INHALE 2 PUFFS BY MOUTH TWICE DAILY RINSE MOUTH AFTER USE (Patient taking differently: Inhale 2 puffs into the lungs 2 (two) times daily as  needed for wheezing or shortness of breath. INHALE 2 PUFFS BY MOUTH TWICE DAILY RINSE MOUTH AFTER USE) 39 g 2   Omega-3 Fatty Acids (FISH OIL) 1000 MG CAPS Take 2,000 mg by mouth daily.     pseudoephedrine  (SUDAFED) 30 MG tablet Take 30 mg by mouth every 4 (four) hours as needed for congestion.     rosuvastatin  (CRESTOR ) 10 MG tablet TAKE 1 TABLET DAILY 90 tablet 3   sildenafil  (REVATIO ) 20 MG tablet Take one tab po x 1 dose 1-4 hours before sexual activity; max one dose per day 30 tablet 0   vitamin B-12 (CYANOCOBALAMIN) 500 MCG tablet Take 1,000 mcg by mouth daily.     No current facility-administered medications for this visit.    Allergies as of 04/18/2024   (No Known Allergies)    Social History   Socioeconomic History   Marital status: Married    Spouse name: Not  on file   Number of children: Not on file   Years of education: Not on file   Highest education level: Not on file  Occupational History   Not on file  Tobacco Use   Smoking status: Never   Smokeless tobacco: Never  Vaping Use   Vaping status: Never Used  Substance and Sexual Activity   Alcohol use: No   Drug use: No   Sexual activity: Yes    Birth control/protection: None  Other Topics Concern   Not on file  Social History Narrative   Not on file   Social Drivers of Health   Financial Resource Strain: Not on file  Food Insecurity: Not on file  Transportation Needs: Not on file  Physical Activity: Not on file  Stress: Not on file  Social Connections: Not on file    Review of systems General: negative for malaise, night sweats, fever, chills, weight los Neck: Negative for lumps, goiter, pain and significant neck swelling Resp: Negative for cough, wheezing, dyspnea at rest CV: Negative for chest pain, leg swelling, palpitations, orthopnea GI: denies melena, hematochezia, nausea, vomiting, diarrhea, constipation, dysphagia, odyonophagia, early satiety or unintentional weight loss.  MSK: Negative for  joint pain or swelling, back pain, and muscle pain. Derm: Negative for itching or rash Psych: Denies depression, anxiety, memory loss, confusion. No homicidal or suicidal ideation.  Heme: Negative for prolonged bleeding, bruising easily, and swollen nodes. Endocrine: Negative for cold or heat intolerance, polyuria, polydipsia and goiter. Neuro: negative for tremor, gait imbalance, syncope and seizures. The remainder of the review of systems is noncontributory.  Physical Exam: BP 123/81   Pulse 80   Temp 97.7 F (36.5 C)   Ht 5\' 10"  (1.778 m)   Wt 226 lb 12.8 oz (102.9 kg)   BMI 32.54 kg/m  General:   Alert and oriented. No distress noted. Pleasant and cooperative.  Head:  Normocephalic and atraumatic. Eyes:  Conjuctiva clear without scleral icterus. Mouth:  Oral mucosa pink and moist. Good dentition. No lesions. Heart: Normal rate and rhythm, s1 and s2 heart sounds present.  Lungs: Clear lung sounds in all lobes. Respirations equal and unlabored. Abdomen:  +BS, soft, non-tender and non-distended. No rebound or guarding. No HSM or masses noted. Derm: No palmar erythema or jaundice Msk:  Symmetrical without gross deformities. Normal posture. Extremities:  Without edema. Neurologic:  Alert and  oriented x4 Psych:  Alert and cooperative. Normal mood and affect.  Invalid input(s): "6 MONTHS"   ASSESSMENT: ROMY MCGUE is a 63 y.o. male presenting today    PLAN:    Follow Up: 6 months   Jacob Hanna L. Yolani Vo, MSN, APRN, AGNP-C Adult-Gerontology Nurse Practitioner [facility]

## 2024-04-18 NOTE — Patient Instructions (Signed)
 We will continue with fiber as you are doing I am providing the low FODMAP diet which can help to pinpoint certain trigger foods for diarrhea Let me know if you have new or worsening GI issues  Follow up 6 months  It was a pleasure to see you today. I want to create trusting relationships with patients and provide genuine, compassionate, and quality care. I truly value your feedback! please be on the lookout for a survey regarding your visit with me today. I appreciate your input about our visit and your time in completing this!    Oracio Galen L. Genavie Boettger, MSN, APRN, AGNP-C Adult-Gerontology Nurse Practitioner [facility]

## 2024-05-01 ENCOUNTER — Other Ambulatory Visit: Payer: Self-pay

## 2024-05-01 ENCOUNTER — Encounter: Payer: Self-pay | Admitting: Family Medicine

## 2024-05-01 DIAGNOSIS — Z0000 Encounter for general adult medical examination without abnormal findings: Secondary | ICD-10-CM

## 2024-05-01 DIAGNOSIS — Z79899 Other long term (current) drug therapy: Secondary | ICD-10-CM

## 2024-05-01 DIAGNOSIS — D509 Iron deficiency anemia, unspecified: Secondary | ICD-10-CM

## 2024-05-01 DIAGNOSIS — E785 Hyperlipidemia, unspecified: Secondary | ICD-10-CM

## 2024-05-01 DIAGNOSIS — R7303 Prediabetes: Secondary | ICD-10-CM

## 2024-05-01 DIAGNOSIS — Z125 Encounter for screening for malignant neoplasm of prostate: Secondary | ICD-10-CM

## 2024-05-01 NOTE — Telephone Encounter (Signed)
 Nurses Please order the following CBC, metabolic 7, lipid, liver, PSA, A1c Hyperlipidemia, high risk med, wellness, screening prostate cancer, prediabetes, iron deficient anemia  He can do these lab work at any point in time and we will discuss results at his wellness thanks-Dr. Glendia

## 2024-05-01 NOTE — Telephone Encounter (Signed)
 Lipid, liver, metabolic 7, A1c, PSA, CBC  Diagnosis iron deficient anemia, screening prostate cancer, prediabetes, high risk med, hyperlipidemia  Please order labs and notify patient thank you

## 2024-05-18 ENCOUNTER — Ambulatory Visit: Payer: Self-pay | Admitting: Family Medicine

## 2024-05-18 LAB — HEPATIC FUNCTION PANEL
ALT: 14 IU/L (ref 0–44)
AST: 19 IU/L (ref 0–40)
Albumin: 4.6 g/dL (ref 3.9–4.9)
Alkaline Phosphatase: 131 IU/L — ABNORMAL HIGH (ref 44–121)
Bilirubin Total: 0.7 mg/dL (ref 0.0–1.2)
Bilirubin, Direct: 0.24 mg/dL (ref 0.00–0.40)
Total Protein: 7.3 g/dL (ref 6.0–8.5)

## 2024-05-18 LAB — PSA: Prostate Specific Ag, Serum: 0.5 ng/mL (ref 0.0–4.0)

## 2024-05-18 LAB — CBC WITH DIFFERENTIAL/PLATELET
Basophils Absolute: 0.1 x10E3/uL (ref 0.0–0.2)
Basos: 1 %
EOS (ABSOLUTE): 0.2 x10E3/uL (ref 0.0–0.4)
Eos: 3 %
Hematocrit: 47.1 % (ref 37.5–51.0)
Hemoglobin: 15.3 g/dL (ref 13.0–17.7)
Immature Grans (Abs): 0 x10E3/uL (ref 0.0–0.1)
Immature Granulocytes: 0 %
Lymphocytes Absolute: 2.3 x10E3/uL (ref 0.7–3.1)
Lymphs: 42 %
MCH: 31.7 pg (ref 26.6–33.0)
MCHC: 32.5 g/dL (ref 31.5–35.7)
MCV: 98 fL — ABNORMAL HIGH (ref 79–97)
Monocytes Absolute: 0.6 x10E3/uL (ref 0.1–0.9)
Monocytes: 11 %
Neutrophils Absolute: 2.4 x10E3/uL (ref 1.4–7.0)
Neutrophils: 43 %
Platelets: 261 x10E3/uL (ref 150–450)
RBC: 4.82 x10E6/uL (ref 4.14–5.80)
RDW: 13.2 % (ref 11.6–15.4)
WBC: 5.5 x10E3/uL (ref 3.4–10.8)

## 2024-05-18 LAB — BASIC METABOLIC PANEL WITH GFR
BUN/Creatinine Ratio: 12 (ref 10–24)
BUN: 13 mg/dL (ref 8–27)
CO2: 23 mmol/L (ref 20–29)
Calcium: 9.6 mg/dL (ref 8.6–10.2)
Chloride: 103 mmol/L (ref 96–106)
Creatinine, Ser: 1.08 mg/dL (ref 0.76–1.27)
Glucose: 102 mg/dL — ABNORMAL HIGH (ref 70–99)
Potassium: 4.9 mmol/L (ref 3.5–5.2)
Sodium: 141 mmol/L (ref 134–144)
eGFR: 78 mL/min/1.73 (ref >59)

## 2024-05-18 LAB — LIPID PANEL
Chol/HDL Ratio: 2.4 ratio (ref 0.0–5.0)
Cholesterol, Total: 106 mg/dL (ref 100–199)
HDL: 45 mg/dL (ref >39)
LDL Chol Calc (NIH): 44 mg/dL (ref 0–99)
Triglycerides: 83 mg/dL (ref 0–149)
VLDL Cholesterol Cal: 17 mg/dL (ref 5–40)

## 2024-05-18 LAB — HEMOGLOBIN A1C
Est. average glucose Bld gHb Est-mCnc: 126 mg/dL
Hgb A1c MFr Bld: 6 % — ABNORMAL HIGH (ref 4.8–5.6)

## 2024-05-23 ENCOUNTER — Ambulatory Visit: Payer: Managed Care, Other (non HMO) | Admitting: Family Medicine

## 2024-05-23 VITALS — BP 114/76 | HR 87 | Temp 98.2°F | Ht 70.0 in | Wt 226.0 lb

## 2024-05-23 DIAGNOSIS — Z0000 Encounter for general adult medical examination without abnormal findings: Secondary | ICD-10-CM

## 2024-05-23 DIAGNOSIS — E785 Hyperlipidemia, unspecified: Secondary | ICD-10-CM

## 2024-05-23 DIAGNOSIS — Z0001 Encounter for general adult medical examination with abnormal findings: Secondary | ICD-10-CM

## 2024-05-23 MED ORDER — ROSUVASTATIN CALCIUM 10 MG PO TABS: 10.0000 mg | ORAL_TABLET | Freq: Every day | ORAL | 3 refills | Status: DC

## 2024-05-23 MED ORDER — ALBUTEROL SULFATE HFA 108 (90 BASE) MCG/ACT IN AERS: 2.0000 | INHALATION_SPRAY | RESPIRATORY_TRACT | 4 refills | Status: AC | PRN

## 2024-05-23 MED ORDER — DULERA 100-5 MCG/ACT IN AERO: INHALATION_SPRAY | RESPIRATORY_TRACT | 2 refills | Status: AC

## 2024-05-23 NOTE — Progress Notes (Signed)
 Subjective:    Patient ID: Jacob Hanna, male    DOB: 1961-03-28, 63 y.o.   MRN: [id number]  HPI The patient comes in today for a wellness visit. Here today for wellness Does not smoke or drink Stays physically active  Patient states he is trying to be healthy with eating habits and is trying to stay physically active  A review of their health history was completed.  A review of medications was also completed.  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   Additional concerns: None at this time   Review of Systems     Objective:   Physical Exam General-in no acute distress Eyes-no discharge Lungs-respiratory rate normal, CTA CV-no murmurs,RRR Extremities skin warm dry no edema Neuro grossly normal Behavior normal, alert Prostate exam normal soft  Results for orders placed or performed in visit on 05/01/24  CBC with Differential   Collection Time: 05/17/24  8:02 AM  Result Value Ref Range   WBC 5.5 3.4 - 10.8 x10E3/uL   RBC 4.82 4.14 - 5.80 x10E6/uL   Hemoglobin 15.3 13.0 - 17.7 g/dL   Hematocrit 52.8 62.4 - 51.0 %   MCV 98 (H) 79 - 97 fL   MCH 31.7 26.6 - 33.0 pg   MCHC 32.5 31.5 - 35.7 g/dL   RDW 86.7 88.3 - 84.5 %   Platelets 261 150 - 450 x10E3/uL   Neutrophils 43 Not Estab. %   Lymphs 42 Not Estab. %   Monocytes 11 Not Estab. %   Eos 3 Not Estab. %   Basos 1 Not Estab. %   Neutrophils Absolute 2.4 1.4 - 7.0 x10E3/uL   Lymphocytes Absolute 2.3 0.7 - 3.1 x10E3/uL   Monocytes Absolute 0.6 0.1 - 0.9 x10E3/uL   EOS (ABSOLUTE) 0.2 0.0 - 0.4 x10E3/uL   Basophils Absolute 0.1 0.0 - 0.2 x10E3/uL   Immature Granulocytes 0 Not Estab. %   Immature Grans (Abs) 0.0 0.0 - 0.1 x10E3/uL  Basic Metabolic Panel   Collection Time: 05/17/24  8:02 AM  Result Value Ref Range   Glucose 102 (H) 70 -  99 mg/dL   BUN 13 8 - 27 mg/dL   Creatinine, Ser 8.91 0.76 - 1.27 mg/dL   eGFR 78 >40 fO/fpw/8.26   BUN/Creatinine Ratio 12 10 - 24   Sodium 141 134 - 144 mmol/L   Potassium 4.9 3.5 - 5.2 mmol/L   Chloride 103 96 - 106 mmol/L   CO2 23 20 - 29 mmol/L   Calcium  9.6 8.6 - 10.2 mg/dL  Lipid Panel   Collection Time: 05/17/24  8:02 AM  Result Value Ref Range   Cholesterol, Total 106 100 - 199 mg/dL   Triglycerides 83 0 - 149 mg/dL   HDL 45 >60 mg/dL   VLDL Cholesterol Cal 17 5 - 40 mg/dL   LDL Chol Calc (NIH) 44 0 - 99 mg/dL   Chol/HDL Ratio 2.4 0.0 - 5.0 ratio  Hepatic Function Panel   Collection Time: 05/17/24  8:02 AM  Result Value Ref Range   Total Protein 7.3 6.0 - 8.5 g/dL   Albumin  4.6 3.9 - 4.9 g/dL   Bilirubin Total 0.7 0.0 - 1.2 mg/dL   Bilirubin, Direct 9.75 0.00 - 0.40 mg/dL   Alkaline Phosphatase 131 (H) 44 - 121 IU/L   AST 19  0 - 40 IU/L   ALT 14 0 - 44 IU/L  PSA   Collection Time: 05/17/24  8:02 AM  Result Value Ref Range   Prostate Specific Ag, Serum 0.5 0.0 - 4.0 ng/mL  Hemoglobin A1c   Collection Time: 05/17/24  8:02 AM  Result Value Ref Range   Hgb A1c MFr Bld 6.0 (H) 4.8 - 5.6 %   Est. average glucose Bld gHb Est-mCnc 126 mg/dL          Assessment & Plan:   1. Well adult exam (Primary) Adult wellness-complete.wellness physical was conducted today. Importance of diet and exercise were discussed in detail.  Importance of stress reduction and healthy living were discussed.  In addition to this a discussion regarding safety was also covered.  We also reviewed over immunizations and gave recommendations regarding current immunization needed for age.   In addition to this additional areas were also touched on including: Preventative health exams needed:  Colonoscopy 2033  Vaccines recommended patient will take under consideration including shingles and pneumococcal  Patient was advised yearly wellness exam   2. Hyperlipidemia, unspecified  hyperlipidemia type Continue current measures healthy diet  Check lab work 6 months Follow-up in 1 year

## 2024-06-26 ENCOUNTER — Other Ambulatory Visit: Payer: Self-pay | Admitting: Family Medicine

## 2024-08-23 ENCOUNTER — Encounter (INDEPENDENT_AMBULATORY_CARE_PROVIDER_SITE_OTHER): Payer: Self-pay | Admitting: Gastroenterology

## 2024-09-19 ENCOUNTER — Encounter (INDEPENDENT_AMBULATORY_CARE_PROVIDER_SITE_OTHER): Payer: Self-pay | Admitting: Gastroenterology

## 2024-11-09 ENCOUNTER — Encounter: Payer: Self-pay | Admitting: Family Medicine

## 2024-11-09 ENCOUNTER — Other Ambulatory Visit: Payer: Self-pay | Admitting: Family Medicine

## 2024-11-09 DIAGNOSIS — E538 Deficiency of other specified B group vitamins: Secondary | ICD-10-CM

## 2024-11-09 DIAGNOSIS — E785 Hyperlipidemia, unspecified: Secondary | ICD-10-CM

## 2024-11-09 DIAGNOSIS — R748 Abnormal levels of other serum enzymes: Secondary | ICD-10-CM

## 2024-11-09 DIAGNOSIS — D7589 Other specified diseases of blood and blood-forming organs: Secondary | ICD-10-CM

## 2024-11-09 DIAGNOSIS — R7303 Prediabetes: Secondary | ICD-10-CM

## 2024-11-30 ENCOUNTER — Ambulatory Visit: Payer: Self-pay | Admitting: Family Medicine

## 2024-11-30 LAB — HEPATIC FUNCTION PANEL
ALT: 11 IU/L (ref 0–44)
AST: 18 IU/L (ref 0–40)
Albumin: 4.5 g/dL (ref 3.9–4.9)
Alkaline Phosphatase: 120 IU/L (ref 47–123)
Bilirubin Total: 0.7 mg/dL (ref 0.0–1.2)
Bilirubin, Direct: 0.24 mg/dL (ref 0.00–0.40)
Total Protein: 6.9 g/dL (ref 6.0–8.5)

## 2024-11-30 LAB — CBC WITH DIFFERENTIAL/PLATELET
Basophils Absolute: 0 x10E3/uL (ref 0.0–0.2)
Basos: 1 %
EOS (ABSOLUTE): 0.1 x10E3/uL (ref 0.0–0.4)
Eos: 3 %
Hematocrit: 45.3 % (ref 37.5–51.0)
Hemoglobin: 15.3 g/dL (ref 13.0–17.7)
Immature Grans (Abs): 0 x10E3/uL (ref 0.0–0.1)
Immature Granulocytes: 0 %
Lymphocytes Absolute: 1.9 x10E3/uL (ref 0.7–3.1)
Lymphs: 38 %
MCH: 32.2 pg (ref 26.6–33.0)
MCHC: 33.8 g/dL (ref 31.5–35.7)
MCV: 95 fL (ref 79–97)
Monocytes Absolute: 0.5 x10E3/uL (ref 0.1–0.9)
Monocytes: 9 %
Neutrophils Absolute: 2.5 x10E3/uL (ref 1.4–7.0)
Neutrophils: 49 %
Platelets: 252 x10E3/uL (ref 150–450)
RBC: 4.75 x10E6/uL (ref 4.14–5.80)
RDW: 12.7 % (ref 11.6–15.4)
WBC: 5 x10E3/uL (ref 3.4–10.8)

## 2024-11-30 LAB — LIPID PANEL
Chol/HDL Ratio: 2.4 ratio (ref 0.0–5.0)
Cholesterol, Total: 113 mg/dL (ref 100–199)
HDL: 47 mg/dL
LDL Chol Calc (NIH): 48 mg/dL (ref 0–99)
Triglycerides: 95 mg/dL (ref 0–149)
VLDL Cholesterol Cal: 18 mg/dL (ref 5–40)

## 2024-11-30 LAB — GAMMA GT: GGT: 13 IU/L (ref 0–65)

## 2024-11-30 LAB — HEMOGLOBIN A1C
Est. average glucose Bld gHb Est-mCnc: 128 mg/dL
Hgb A1c MFr Bld: 6.1 % — ABNORMAL HIGH (ref 4.8–5.6)

## 2024-11-30 LAB — VITAMIN B12: Vitamin B-12: 1363 pg/mL — ABNORMAL HIGH (ref 232–1245)
